# Patient Record
Sex: Female | Born: 1945
Health system: Southern US, Community
[De-identification: ages and names within clinical notes are randomized; demographics above are authoritative.]

## PROBLEM LIST (undated history)

## (undated) DIAGNOSIS — I38 Endocarditis, valve unspecified: Secondary | ICD-10-CM

## (undated) DIAGNOSIS — E785 Hyperlipidemia, unspecified: Secondary | ICD-10-CM

## (undated) DIAGNOSIS — D649 Anemia, unspecified: Secondary | ICD-10-CM

## (undated) DIAGNOSIS — Z973 Presence of spectacles and contact lenses: Secondary | ICD-10-CM

## (undated) DIAGNOSIS — I10 Essential (primary) hypertension: Secondary | ICD-10-CM

## (undated) DIAGNOSIS — E039 Hypothyroidism, unspecified: Secondary | ICD-10-CM

## (undated) DIAGNOSIS — I5189 Other ill-defined heart diseases: Secondary | ICD-10-CM

## (undated) DIAGNOSIS — T7840XA Allergy, unspecified, initial encounter: Secondary | ICD-10-CM

## (undated) DIAGNOSIS — E119 Type 2 diabetes mellitus without complications: Secondary | ICD-10-CM

## (undated) HISTORY — PX: TONSILLECTOMY: SHX5217

## (undated) HISTORY — PX: TUBAL LIGATION: SHX77

## (undated) HISTORY — DX: Other ill-defined heart diseases: I51.89

## (undated) HISTORY — DX: Endocarditis, valve unspecified: I38

## (undated) HISTORY — DX: Hypothyroidism, unspecified: E03.9

## (undated) HISTORY — DX: Hyperlipidemia, unspecified: E78.5

## (undated) HISTORY — PX: COLONOSCOPY: SHX174

## (undated) HISTORY — DX: Allergy, unspecified, initial encounter: T78.40XA

## (undated) HISTORY — PX: GANGLION CYST EXCISION: SHX1691

## (undated) HISTORY — DX: Essential (primary) hypertension: I10

## (undated) HISTORY — DX: Type 2 diabetes mellitus without complications: E11.9

---

## 2005-05-11 ENCOUNTER — Ambulatory Visit: Payer: Self-pay

## 2005-05-23 ENCOUNTER — Ambulatory Visit: Payer: Self-pay

## 2005-11-24 ENCOUNTER — Ambulatory Visit: Payer: Self-pay

## 2006-03-14 ENCOUNTER — Ambulatory Visit: Payer: Self-pay | Admitting: Gastroenterology

## 2006-04-19 DIAGNOSIS — D531 Other megaloblastic anemias, not elsewhere classified: Secondary | ICD-10-CM | POA: Insufficient documentation

## 2006-05-01 ENCOUNTER — Ambulatory Visit: Payer: Self-pay | Admitting: Family Medicine

## 2006-05-31 ENCOUNTER — Ambulatory Visit: Payer: Self-pay

## 2007-03-07 ENCOUNTER — Encounter: Payer: Self-pay | Admitting: Cardiovascular Disease

## 2007-07-02 ENCOUNTER — Ambulatory Visit: Payer: Self-pay | Admitting: Family Medicine

## 2008-09-08 ENCOUNTER — Ambulatory Visit: Payer: Self-pay | Admitting: Family Medicine

## 2009-05-17 ENCOUNTER — Encounter: Payer: Self-pay | Admitting: Cardiovascular Disease

## 2009-11-12 ENCOUNTER — Ambulatory Visit: Payer: Self-pay | Admitting: Family Medicine

## 2010-05-02 ENCOUNTER — Encounter: Payer: Self-pay | Admitting: Cardiovascular Disease

## 2010-05-31 ENCOUNTER — Encounter: Payer: Self-pay | Admitting: Cardiovascular Disease

## 2010-06-16 ENCOUNTER — Ambulatory Visit: Payer: Self-pay | Admitting: Cardiovascular Disease

## 2010-06-16 DIAGNOSIS — R011 Cardiac murmur, unspecified: Secondary | ICD-10-CM

## 2010-10-25 NOTE — Letter (Signed)
Summary: Medical Record Release  Medical Record Release   Imported By: Harlon Flor 06/16/2010 13:40:16  _____________________________________________________________________  External Attachment:    Type:   Image     Comment:   External Document

## 2010-10-25 NOTE — Letter (Signed)
Summary: [facility]  [facility]   Imported By: Marylou Mccoy 06/30/2010 14:00:21  _____________________________________________________________________  External Attachment:    Type:   Image     Comment:   External Document

## 2010-10-25 NOTE — Miscellaneous (Signed)
Summary: refill lisinopril hctz  Clinical Lists Changes  Medications: Added new medication of LISINOPRIL-HYDROCHLOROTHIAZIDE 20-12.5 MG TABS (LISINOPRIL-HYDROCHLOROTHIAZIDE) one tablet once daily - Signed Rx of LISINOPRIL-HYDROCHLOROTHIAZIDE 20-12.5 MG TABS (LISINOPRIL-HYDROCHLOROTHIAZIDE) one tablet once daily;  #30 x 0;  Signed;  Entered by: Bishop Dublin, CMA;  Authorized by: Dossie Arbour MD;  Method used: [id number] [address] [place], [place], [place]  [address], Ph: [phone], Fax: [phone]    Prescriptions: LISINOPRIL-HYDROCHLOROTHIAZIDE 20-12.5 MG TABS (LISINOPRIL-HYDROCHLOROTHIAZIDE) one tablet once daily  #30 x 0   Entered by:   Bishop Dublin, CMA   Authorized by:   Dossie Arbour MD   Signed by:   Bishop Dublin, CMA on 05/31/2010   Method used:   [facility]ronically to        [facility] [phone]* (retail)       [address]       [place], [place]  [address]       Ph: [phone]       Fax: [phone]   RxID:   [id number]

## 2010-10-25 NOTE — Assessment & Plan Note (Signed)
Summary: NP6/AMD   Visit Type:  Initial Consult Primary Michaela Weber:  [name].  CC:  Denies chest pain or shrotness of breath..  History of Present Illness: Michaela Weber is a very pleasant 65 year old woman with no known coronary artery disease, history of hypertension, diastolic dysfunction, hypothyroidism, cardiac murmur with mild aortic valve sclerosis, hyperlipidemia who presents to establish care. She was last seen by myself at [facility] in August 2010.  She reports that she has been doing well. She has recently started aerobics as her cholesterol is high. She tried to watch what she eats. She has no shortness of breath or chest discomfort. Overall has no new complaints.she ran out of her blood pressure medication last week. Notes indicate this was started for diastolic pressures greater than 90.  EKG shows normal sinus rhythm with rate of 89 beats per minute, no significant ST or T wave changes  total cholesterol is 214, LDL 138, HDL 57  Preventive Screening-Counseling & Management  Alcohol-Tobacco     Smoking Status: never  Caffeine-Diet-Exercise     Does Patient Exercise: yes  Current Medications (verified): 1)  Lisinopril-Hydrochlorothiazide 20-12.5 Mg Tabs (Lisinopril-Hydrochlorothiazide) .... One Tablet Once Daily 2)  Levothroid 100 Mcg Tabs (Levothyroxine Sodium) .... One Tablet Once Daily 3)  Aspir-Low 81 Mg Tbec (Aspirin) .... One Tablet Once Daily 4)  Vitamin D 2 1.25 Mg .... Once Weekly 5)  Cyanocobalamin 1000 Mcg/ml Soln (Cyanocobalamin) .... Once Monthly  Allergies (verified): 1)  ! Codeine  Past History:  Family History: Last updated: 07-Jul-2010 Father: deceased diabetes Mother: deceased Alzheimer's  Social History: Last updated: Jul 07, 2010 Retired  Married  Tobacco Use - No.  Alcohol Use - no Regular Exercise - yes--2-3 x weekly aerobics.  Risk Factors: Exercise: yes (July 07, 2010)  Risk Factors: Smoking Status:  never (July 07, 2010)  Past Medical History: Hypertension Hypothyroidism Vitamin D deficiency mild valvular disease diastolic dysfunction  Past Surgical History: tonsillectomy C-section x 2 tubligation  Family History: Father: deceased diabetes Mother: deceased Alzheimer's  Social History: Retired  Married  Tobacco Use - No.  Alcohol Use - no Regular Exercise - yes--2-3 x weekly aerobics. Smoking Status:  never Does Patient Exercise:  yes  Review of Systems  The patient denies fever, weight loss, weight gain, vision loss, decreased hearing, hoarseness, chest pain, syncope, dyspnea on exertion, peripheral edema, prolonged cough, abdominal pain, incontinence, muscle weakness, depression, and enlarged lymph nodes.    Vital Signs:  Patient profile:   65 year old female Height:      63 inches Weight:      152 pounds BMI:     27.02 Pulse rate:   89 / minute BP sitting:   132 / 82  (left arm) Cuff size:   regular  Vitals Entered By: Bishop Dublin, CMA (07-07-2010 9:57 AM)  Physical Exam  General:  Well developed, well nourished, in no acute distress. Head:  normocephalic and atraumatic Neck:  Neck supple, no JVD. No masses, thyromegaly or abnormal cervical nodes. Lungs:  Clear bilaterally to auscultation and percussion. Heart:  Non-displaced PMI, chest non-tender; regular rate and rhythm, S1, S2 with II/VI SEM RSB, no rubs or gallops. Carotid upstroke normal, no bruit.Pedals normal pulses. No edema, no varicosities. Abdomen:   abdomen soft and non-tender without masses Msk:  Back normal, normal gait. Muscle strength and tone normal. Pulses:  pulses normal in all 4 extremities Extremities:  No clubbing or cyanosis. Neurologic:  Alert and oriented x 3. Skin:  Intact  without lesions or rashes. Psych:  Normal affect.   Impression & Recommendations:  Problem # 1:  HYPERTENSION, BENIGN (ICD-401.1)  she ran out of her blood pressure medication last week. Have  asked her to monitor her blood pressure at home. If she has systolics greater than 140, diastolic greater than 90, have asked her to either restart half or the whole dose of her lisinopril HCT. I have given her some parameters of where her blood pressure should be.  Her updated medication list for this problem includes:    Lisinopril-hydrochlorothiazide 20-12.5 Mg Tabs (Lisinopril-hydrochlorothiazide) ..... One tablet once daily    Aspir-low 81 Mg Tbec (Aspirin) ..... One tablet once daily  Problem # 2:  MURMUR (ICD-785.2)  Murmur is secondary to aortic valve sclerosis with no significant stenosis. We will watch this every few years and would repeat an echocardiogram if her murmur gets louder.  Her updated medication list for this problem includes:    Lisinopril-hydrochlorothiazide 20-12.5 Mg Tabs (Lisinopril-hydrochlorothiazide) ..... One tablet once daily  Problem # 3:  HYPERLIPIDEMIA-MIXED (ICD-272.4) we talked to her about her lipids. They are mildly elevated and have encouraged her to continue to watch her diet, lose weight, increase her aerobics  Other Orders: EKG w/ Interpretation (93000)  Patient Instructions: 1)  Your physician recommends that you continue on your current medications as directed. Please refer to the Current Medication list given to you today. 2)  Your physician wants you to follow-up in:  1 year  You will receive a reminder letter in the mail two months in advance. If you don't receive a letter, please call our office to schedule the follow-up appointment. 3)  Your physician has requested that you regularly monitor and record your blood pressure readings at home.  Prescriptions: LISINOPRIL-HYDROCHLOROTHIAZIDE 20-12.5 MG TABS (LISINOPRIL-HYDROCHLOROTHIAZIDE) one tablet once daily  #30 x 12   Entered by:   Benedict Needy, RN   Authorized by:   Dossie Arbour MD   Signed by:   Benedict Needy, RN on 06/16/2010   Method used:   [facility]ronically to        [facility]  [phone]* (retail)       [address]       [place], [place]  [address]       Ph: [phone]       Fax: [phone]   RxID:   [id number]

## 2010-10-25 NOTE — Letter (Signed)
Summary: Patient Receipt of NPP  Patient Receipt of NPP   Imported By: Marylou Mccoy 06/28/2010 14:00:50  _____________________________________________________________________  External Attachment:    Type:   Image     Comment:   External Document

## 2010-11-17 ENCOUNTER — Ambulatory Visit: Payer: Self-pay | Admitting: Family Medicine

## 2011-04-11 ENCOUNTER — Encounter: Payer: Self-pay | Admitting: Cardiovascular Disease

## 2011-06-26 ENCOUNTER — Encounter: Payer: Self-pay | Admitting: Cardiovascular Disease

## 2011-06-28 ENCOUNTER — Ambulatory Visit (INDEPENDENT_AMBULATORY_CARE_PROVIDER_SITE_OTHER): Payer: Medicare Other | Admitting: Cardiovascular Disease

## 2011-06-28 ENCOUNTER — Encounter: Payer: Self-pay | Admitting: Cardiovascular Disease

## 2011-06-28 DIAGNOSIS — E785 Hyperlipidemia, unspecified: Secondary | ICD-10-CM

## 2011-06-28 DIAGNOSIS — I10 Essential (primary) hypertension: Secondary | ICD-10-CM

## 2011-06-28 DIAGNOSIS — R011 Cardiac murmur, unspecified: Secondary | ICD-10-CM

## 2011-06-28 NOTE — Assessment & Plan Note (Signed)
Soft murmur is from aortic sclerosis, no significant stenosis on previous evaluations.

## 2011-06-28 NOTE — Patient Instructions (Signed)
You are doing well. You could try Red Yeast Rice for cholesterol Please call us if you have new issues that need to be addressed before your next appt.  We will call you for a follow up Appt. In 12 months

## 2011-06-28 NOTE — Assessment & Plan Note (Signed)
We did spend some time talking about her cholesterol, the importance of eating well, exercising on a regular basis. Her cholesterol has improved. We did suggest that if she would like to be more aggressive with her cholesterol, that she could take over-the-counter red yeast rice.

## 2011-06-28 NOTE — Progress Notes (Signed)
   Patient ID: Michaela Weber, female    DOB: [birth year], 65 y.o.   MRN: [id number]  HPI Comments: Michaela Weber is a very pleasant 65 year old woman with no known coronary artery disease, history of hypertension, diastolic dysfunction, hypothyroidism, cardiac murmur with mild aortic valve sclerosis, hyperlipidemia who presents for routine followup.  She reports that she has had significant headaches to August lasting for 2 weeks. No over-the-counter medication seems to have helped her headache. Blood pressure was noted to be elevated with systolic pressures in the 140 range over 90. She was seen by Dr. Elease Hashimoto who restarted her blood pressure medication, lisinopril HCT 10/12.5 mg daily. She reports that this seems to have helped her headache and currently she feels well.    EKG shows normal sinus rhythm with rate of 83 beats per minute, no significant ST or T wave changes   total cholesterol is 189, HDL 52, LDL 104      Outpatient Encounter Prescriptions as of 06/28/2011  Medication Sig Dispense Refill  . aspirin 81 MG EC tablet Take 81 mg by mouth daily.        . cyanocobalamin (,VITAMIN B-12,) 1000 MCG/ML injection Inject 1,000 mcg into the muscle every 30 (thirty) days.        Marland Kitchen levothyroxine (SYNTHROID, LEVOTHROID) 100 MCG tablet Take 100 mcg by mouth daily.        Marland Kitchen lisinopril-hydrochlorothiazide (PRINZIDE,ZESTORETIC) 10-12.5 MG per tablet Take 1 tablet by mouth daily.        Marland Kitchen VITAMIN D, CHOLECALCIFEROL, PO Take by mouth once a week.           Review of Systems  Constitutional: Negative.   HENT: Negative.   Eyes: Negative.   Respiratory: Negative.   Cardiovascular: Negative.   Gastrointestinal: Negative.   Musculoskeletal: Negative.   Skin: Negative.   Neurological: Negative.   Hematological: Negative.   Psychiatric/Behavioral: Negative.   All other systems reviewed and are negative.    BP 130/91  Pulse 89  Ht 5\' 2"  (1.575 m)  Wt 152 lb (68.947 kg)  BMI 27.80  kg/m2   Physical Exam  Nursing note and vitals reviewed. Constitutional: She is oriented to person, place, and time. She appears well-developed and well-nourished.  HENT:  Head: Normocephalic.  Nose: Nose normal.  Mouth/Throat: Oropharynx is clear and moist.  Eyes: Conjunctivae are normal. Pupils are equal, round, and reactive to light.  Neck: Normal range of motion. Neck supple. No JVD present.  Cardiovascular: Normal rate, regular rhythm, S1 normal, S2 normal, normal heart sounds and intact distal pulses.  Exam reveals no gallop and no friction rub.   No murmur heard. Pulmonary/Chest: Effort normal and breath sounds normal. No respiratory distress. She has no wheezes. She has no rales. She exhibits no tenderness.  Abdominal: Soft. Bowel sounds are normal. She exhibits no distension. There is no tenderness.  Musculoskeletal: Normal range of motion. She exhibits no edema and no tenderness.  Lymphadenopathy:    She has no cervical adenopathy.  Neurological: She is alert and oriented to person, place, and time. Coordination normal.  Skin: Skin is warm and dry. No rash noted. No erythema.  Psychiatric: She has a normal mood and affect. Her behavior is normal. Judgment and thought content normal.         Assessment and Plan

## 2011-06-28 NOTE — Assessment & Plan Note (Signed)
Blood pressure was well-controlled on today's visit. No suggestions were made for medication changes.

## 2011-11-21 ENCOUNTER — Ambulatory Visit: Payer: Self-pay | Admitting: Family Medicine

## 2013-01-13 ENCOUNTER — Ambulatory Visit: Payer: Self-pay | Admitting: Family Medicine

## 2013-01-27 ENCOUNTER — Ambulatory Visit: Payer: Self-pay | Admitting: Family Medicine

## 2014-02-17 ENCOUNTER — Ambulatory Visit: Payer: Self-pay | Admitting: Family Medicine

## 2014-09-29 DIAGNOSIS — E538 Deficiency of other specified B group vitamins: Secondary | ICD-10-CM | POA: Diagnosis not present

## 2014-10-29 DIAGNOSIS — E538 Deficiency of other specified B group vitamins: Secondary | ICD-10-CM | POA: Diagnosis not present

## 2014-11-24 DIAGNOSIS — E039 Hypothyroidism, unspecified: Secondary | ICD-10-CM | POA: Diagnosis not present

## 2014-11-24 DIAGNOSIS — R739 Hyperglycemia, unspecified: Secondary | ICD-10-CM | POA: Diagnosis not present

## 2014-11-24 DIAGNOSIS — E0590 Thyrotoxicosis, unspecified without thyrotoxic crisis or storm: Secondary | ICD-10-CM | POA: Diagnosis not present

## 2014-11-24 DIAGNOSIS — D531 Other megaloblastic anemias, not elsewhere classified: Secondary | ICD-10-CM | POA: Diagnosis not present

## 2014-11-25 DIAGNOSIS — D531 Other megaloblastic anemias, not elsewhere classified: Secondary | ICD-10-CM | POA: Diagnosis not present

## 2014-11-25 DIAGNOSIS — I10 Essential (primary) hypertension: Secondary | ICD-10-CM | POA: Diagnosis not present

## 2014-11-25 DIAGNOSIS — E538 Deficiency of other specified B group vitamins: Secondary | ICD-10-CM | POA: Diagnosis not present

## 2014-11-25 DIAGNOSIS — R739 Hyperglycemia, unspecified: Secondary | ICD-10-CM | POA: Diagnosis not present

## 2014-11-25 DIAGNOSIS — E785 Hyperlipidemia, unspecified: Secondary | ICD-10-CM | POA: Diagnosis not present

## 2014-12-24 DIAGNOSIS — E538 Deficiency of other specified B group vitamins: Secondary | ICD-10-CM | POA: Diagnosis not present

## 2015-01-21 DIAGNOSIS — D531 Other megaloblastic anemias, not elsewhere classified: Secondary | ICD-10-CM | POA: Diagnosis not present

## 2015-02-02 ENCOUNTER — Other Ambulatory Visit: Payer: Self-pay

## 2015-02-02 DIAGNOSIS — Z1231 Encounter for screening mammogram for malignant neoplasm of breast: Secondary | ICD-10-CM

## 2015-02-23 ENCOUNTER — Ambulatory Visit
Admission: RE | Admit: 2015-02-23 | Discharge: 2015-02-23 | Disposition: A | Discharge status: Home / Self Care | Payer: Medicare Other | Source: Ambulatory Visit | Attending: Family Medicine | Admitting: Family Medicine

## 2015-02-23 ENCOUNTER — Other Ambulatory Visit: Payer: Self-pay

## 2015-02-23 DIAGNOSIS — Z1231 Encounter for screening mammogram for malignant neoplasm of breast: Secondary | ICD-10-CM

## 2015-03-03 ENCOUNTER — Other Ambulatory Visit (INDEPENDENT_AMBULATORY_CARE_PROVIDER_SITE_OTHER): Payer: Medicare Other | Admitting: Family Medicine

## 2015-03-03 ENCOUNTER — Encounter: Payer: Self-pay | Admitting: Family Medicine

## 2015-03-03 VITALS — BP 108/70 | HR 92 | Temp 97.7°F | Resp 16 | Ht 63.0 in | Wt 147.0 lb

## 2015-03-03 DIAGNOSIS — E538 Deficiency of other specified B group vitamins: Secondary | ICD-10-CM | POA: Diagnosis not present

## 2015-03-03 MED ORDER — CYANOCOBALAMIN 1000 MCG/ML IJ SOLN: 1000.0000 ug | INTRAMUSCULAR | Status: DC

## 2015-03-03 MED ORDER — CYANOCOBALAMIN 1000 MCG/ML IJ SOLN
1000.0000 ug | Freq: Once | INTRAMUSCULAR | Status: AC
  Administered 2015-03-03: 1000 ug via INTRAMUSCULAR

## 2015-03-05 ENCOUNTER — Telehealth: Payer: Self-pay | Admitting: Family Medicine

## 2015-03-05 NOTE — Telephone Encounter (Signed)
Advised pt mammogram is negative; advised pt to disregard B12 prescription due to error. Renaldo Fiddler, CMA

## 2015-03-05 NOTE — Telephone Encounter (Signed)
Pt is requesting Mammogram results.  [facility] 12 injections.  Pt states she has not been getting these for several years.  Pt states she has been receiving these in the office.  [facility]    CB#[phone]/MJ

## 2015-03-31 ENCOUNTER — Ambulatory Visit (INDEPENDENT_AMBULATORY_CARE_PROVIDER_SITE_OTHER): Payer: Medicare Other

## 2015-03-31 DIAGNOSIS — E538 Deficiency of other specified B group vitamins: Secondary | ICD-10-CM

## 2015-03-31 MED ORDER — CYANOCOBALAMIN 1000 MCG/ML IJ SOLN
1000.0000 ug | Freq: Once | INTRAMUSCULAR | Status: AC
  Administered 2015-03-31: 1000 ug via INTRAMUSCULAR

## 2015-03-31 NOTE — Progress Notes (Signed)
Vitamin B12 injection given at office today. Tolerated well. Pt will come back in 4 weeks per MD's order.

## 2015-04-28 ENCOUNTER — Ambulatory Visit (INDEPENDENT_AMBULATORY_CARE_PROVIDER_SITE_OTHER): Payer: Medicare Other

## 2015-04-28 DIAGNOSIS — E538 Deficiency of other specified B group vitamins: Secondary | ICD-10-CM

## 2015-04-28 MED ORDER — CYANOCOBALAMIN 1000 MCG/ML IJ SOLN
1000.0000 ug | Freq: Once | INTRAMUSCULAR | Status: AC
  Administered 2015-04-28: 1000 ug via INTRAMUSCULAR

## 2015-04-28 NOTE — Progress Notes (Signed)
Vitamin B12  Injection today at office. Tolerated well. Will be back in 4 weeks per MD's order.

## 2015-05-10 ENCOUNTER — Other Ambulatory Visit: Payer: Self-pay | Admitting: Family Medicine

## 2015-05-10 DIAGNOSIS — I10 Essential (primary) hypertension: Secondary | ICD-10-CM

## 2015-05-10 NOTE — Telephone Encounter (Signed)
This is a pt of Dr. Sharyon Medicus, can you refill this prescription?  Future appointment is on 05/26/2015.  Thanks,

## 2015-05-26 ENCOUNTER — Ambulatory Visit (INDEPENDENT_AMBULATORY_CARE_PROVIDER_SITE_OTHER): Payer: Medicare Other

## 2015-05-26 DIAGNOSIS — E538 Deficiency of other specified B group vitamins: Secondary | ICD-10-CM | POA: Diagnosis not present

## 2015-05-26 MED ORDER — CYANOCOBALAMIN 1000 MCG/ML IJ SOLN
1000.0000 ug | Freq: Once | INTRAMUSCULAR | Status: AC
  Administered 2015-05-26: 1000 ug via INTRAMUSCULAR

## 2015-05-26 NOTE — Progress Notes (Signed)
Vitamin B 12 Injection today at office. Tolerated well. Will come back in 4 weeks per MD's order.

## 2015-06-16 ENCOUNTER — Other Ambulatory Visit: Payer: Self-pay | Admitting: Family Medicine

## 2015-06-23 ENCOUNTER — Ambulatory Visit (INDEPENDENT_AMBULATORY_CARE_PROVIDER_SITE_OTHER): Payer: Medicare Other

## 2015-06-23 DIAGNOSIS — E538 Deficiency of other specified B group vitamins: Secondary | ICD-10-CM

## 2015-06-23 MED ORDER — CYANOCOBALAMIN 1000 MCG/ML IJ SOLN
1000.0000 ug | Freq: Once | INTRAMUSCULAR | Status: AC
  Administered 2015-06-23: 1000 ug via INTRAMUSCULAR

## 2015-06-23 MED ORDER — CYANOCOBALAMIN 1000 MCG/ML IJ SOLN: 1000.0000 ug | INTRAMUSCULAR | Status: DC

## 2015-07-14 ENCOUNTER — Other Ambulatory Visit: Payer: Self-pay | Admitting: Family Medicine

## 2015-07-14 DIAGNOSIS — E039 Hypothyroidism, unspecified: Secondary | ICD-10-CM

## 2015-07-14 DIAGNOSIS — I10 Essential (primary) hypertension: Secondary | ICD-10-CM | POA: Insufficient documentation

## 2015-07-14 DIAGNOSIS — E785 Hyperlipidemia, unspecified: Secondary | ICD-10-CM | POA: Insufficient documentation

## 2015-07-14 DIAGNOSIS — E559 Vitamin D deficiency, unspecified: Secondary | ICD-10-CM | POA: Insufficient documentation

## 2015-07-14 DIAGNOSIS — E1159 Type 2 diabetes mellitus with other circulatory complications: Secondary | ICD-10-CM | POA: Insufficient documentation

## 2015-07-14 DIAGNOSIS — E1169 Type 2 diabetes mellitus with other specified complication: Secondary | ICD-10-CM | POA: Insufficient documentation

## 2015-07-14 NOTE — Telephone Encounter (Signed)
Last OV 11/2014   

## 2015-07-16 ENCOUNTER — Other Ambulatory Visit: Payer: Self-pay | Admitting: Family Medicine

## 2015-07-16 DIAGNOSIS — I10 Essential (primary) hypertension: Secondary | ICD-10-CM

## 2015-07-16 NOTE — Telephone Encounter (Signed)
Last OV 11/25/2014  Thanks,   -Mickel Baas

## 2015-07-21 ENCOUNTER — Ambulatory Visit (INDEPENDENT_AMBULATORY_CARE_PROVIDER_SITE_OTHER): Payer: Medicare Other

## 2015-07-21 DIAGNOSIS — E538 Deficiency of other specified B group vitamins: Secondary | ICD-10-CM

## 2015-07-21 MED ORDER — CYANOCOBALAMIN 1000 MCG/ML IJ SOLN
1000.0000 ug | Freq: Once | INTRAMUSCULAR | Status: AC
  Administered 2015-07-27: 1000 ug via INTRAMUSCULAR

## 2015-07-27 DIAGNOSIS — E538 Deficiency of other specified B group vitamins: Secondary | ICD-10-CM | POA: Diagnosis not present

## 2015-08-20 ENCOUNTER — Telehealth: Payer: Self-pay

## 2015-08-20 MED ORDER — BENZONATATE 100 MG PO CAPS: 200.0000 mg | ORAL_CAPSULE | Freq: Three times a day (TID) | ORAL | Status: DC | PRN

## 2015-08-20 NOTE — Telephone Encounter (Signed)
Can put in Monday. Cough does linger for a while. Can send in Tessalon perls if she would like to try those. Thanks.

## 2015-08-20 NOTE — Telephone Encounter (Signed)
Pt advised, appt made, she would like to try the medication. Please send this in-aa

## 2015-08-20 NOTE — Telephone Encounter (Signed)
Patient called and states she has had a cough for almost 2 weeks and it was productive but now is more dry, has hard time sleeping due to cough, hoarse. No fever, no chills, and she does not feel bad at all she just has this lingering. She wanted to make appt for Monday but you have same spots available only is that ok? Is there something she can try to help her cough until then? She has tried Delsym.-aa

## 2015-08-23 ENCOUNTER — Encounter: Payer: Self-pay | Admitting: Family Medicine

## 2015-08-23 ENCOUNTER — Ambulatory Visit (INDEPENDENT_AMBULATORY_CARE_PROVIDER_SITE_OTHER): Payer: Medicare Other | Admitting: Family Medicine

## 2015-08-23 VITALS — BP 120/74 | HR 88 | Temp 98.7°F | Resp 16 | Wt 148.0 lb

## 2015-08-23 DIAGNOSIS — D531 Other megaloblastic anemias, not elsewhere classified: Secondary | ICD-10-CM

## 2015-08-23 DIAGNOSIS — J069 Acute upper respiratory infection, unspecified: Secondary | ICD-10-CM

## 2015-08-23 MED ORDER — CYANOCOBALAMIN 1000 MCG/ML IJ SOLN
1000.0000 ug | Freq: Once | INTRAMUSCULAR | Status: AC
  Administered 2015-08-23: 1000 ug via INTRAMUSCULAR

## 2015-08-23 NOTE — Progress Notes (Signed)
Patient ID: Michaela Weber, female   DOB: 18-Aug-1946, 69 y.o.   MRN: [id number]        Patient: Michaela Weber Female    DOB: 1946/06/05   69 y.o.   MRN: [id number] Visit Date: 08/23/2015  Today's Provider: [name], MD   Chief Complaint  Patient presents with  . URI   Subjective:    URI  This is a new problem. The current episode started 1 to 4 weeks ago. The problem has been gradually worsening. There has been no fever. Associated symptoms include congestion, coughing, headaches, sinus pain and a sore throat (Off and on). Pertinent negatives include no abdominal pain, chest pain, diarrhea, dysuria, ear pain, joint pain, joint swelling, nausea, neck pain, plugged ear sensation, rash, rhinorrhea, sneezing, swollen glands, vomiting or wheezing. Treatments tried: Tessolon perls help at night.  The treatment provided mild relief.    Just not getting better as quickly as she would like. Just no energy to do anything.      Allergies  Allergen Reactions  . Codeine    Previous Medications   ASPIRIN 81 MG EC TABLET    Take 81 mg by mouth daily.     BENZONATATE (TESSALON) 100 MG CAPSULE    Take 2 capsules (200 mg total) by mouth 3 (three) times daily as needed for cough.   CYANOCOBALAMIN (,VITAMIN B-12,) 1000 MCG/ML INJECTION    Inject 1 mL (1,000 mcg total) into the muscle every 30 (thirty) days.   LEVOTHYROXINE (SYNTHROID, LEVOTHROID) 100 MCG TABLET    Take 100 mcg by mouth daily.     LEVOTHYROXINE (SYNTHROID, LEVOTHROID) 112 MCG TABLET    TAKE 1 TABLET BY MOUTH EVERY DAY   LISINOPRIL-HYDROCHLOROTHIAZIDE (PRINZIDE,ZESTORETIC) 10-12.5 MG TABLET    TAKE 1 TABLET BY MOUTH EVERY DAY   VITAMIN D, CHOLECALCIFEROL, PO    Take by mouth once a week.      Review of Systems  Constitutional: Positive for chills and fatigue. Negative for fever, diaphoresis, activity change, appetite change and unexpected weight change.  HENT: Positive for congestion, sinus pressure, sore throat (Off and on) and  voice change. Negative for ear discharge, ear pain, facial swelling, hearing loss, nosebleeds, postnasal drip, rhinorrhea, sneezing, tinnitus and trouble swallowing.   Respiratory: Positive for cough. Negative for apnea, choking, chest tightness, shortness of breath, wheezing and stridor.   Cardiovascular: Negative.  Negative for chest pain, palpitations and leg swelling.  Gastrointestinal: Negative.  Negative for nausea, vomiting, abdominal pain and diarrhea.  Genitourinary: Negative for dysuria.  Musculoskeletal: Negative for joint pain and neck pain.  Skin: Negative for rash.  Neurological: Positive for headaches. Negative for dizziness and light-headedness.    Social History  Substance Use Topics  . Smoking status: Never Smoker   . Smokeless tobacco: Never Used     Comment: tobacco use - no  . Alcohol Use: No   Objective:   BP 120/74 mmHg  Pulse 88  Temp(Src) 98.7 F (37.1 C) (Oral)  Resp 16  Wt 148 lb (67.132 kg)  SpO2 97%  Physical Exam  Constitutional: She is oriented to person, place, and time. She appears well-developed and well-nourished.  HENT:  Head: Normocephalic and atraumatic.  Right Ear: External ear normal.  Left Ear: External ear normal.  Mouth/Throat: Oropharynx is clear and moist.  Eyes: Conjunctivae and EOM are normal. Pupils are equal, round, and reactive to light.  Neck: Normal range of motion. Neck supple.  Cardiovascular: Normal rate and regular rhythm.  Pulmonary/Chest: Effort normal and breath sounds normal.  Neurological: She is alert and oriented to person, place, and time.  Psychiatric: She has a normal mood and affect. Her behavior is normal. Judgment and thought content normal.      Assessment & Plan:     1. Upper respiratory infection Gradually improving. Will hold off on antibiotic for now. Patient instructed to call back if condition worsens or does not improve.     2. Megaloblastic anemia due to B12 deficiency B12 given today.  -  cyanocobalamin ((VITAMIN B-12)) injection 1,000 mcg; Inject 1 mL (1,000 mcg total) into the muscle once.     [name], MD  [facility]

## 2015-08-25 ENCOUNTER — Ambulatory Visit: Payer: Medicare Other

## 2015-09-22 ENCOUNTER — Ambulatory Visit (INDEPENDENT_AMBULATORY_CARE_PROVIDER_SITE_OTHER): Payer: Medicare Other

## 2015-09-22 DIAGNOSIS — E538 Deficiency of other specified B group vitamins: Secondary | ICD-10-CM | POA: Diagnosis not present

## 2015-09-22 MED ORDER — CYANOCOBALAMIN 1000 MCG/ML IJ SOLN
1000.0000 ug | Freq: Once | INTRAMUSCULAR | Status: AC
  Administered 2015-09-22: 1000 ug via INTRAMUSCULAR

## 2015-10-20 ENCOUNTER — Ambulatory Visit (INDEPENDENT_AMBULATORY_CARE_PROVIDER_SITE_OTHER): Payer: Medicare Other

## 2015-10-20 DIAGNOSIS — E538 Deficiency of other specified B group vitamins: Secondary | ICD-10-CM | POA: Diagnosis not present

## 2015-10-20 MED ORDER — CYANOCOBALAMIN 1000 MCG/ML IJ SOLN
1000.0000 ug | Freq: Once | INTRAMUSCULAR | Status: AC
  Administered 2015-10-20: 1000 ug via INTRAMUSCULAR

## 2015-11-17 ENCOUNTER — Ambulatory Visit (INDEPENDENT_AMBULATORY_CARE_PROVIDER_SITE_OTHER): Payer: Medicare Other

## 2015-11-17 DIAGNOSIS — E538 Deficiency of other specified B group vitamins: Secondary | ICD-10-CM | POA: Diagnosis not present

## 2015-11-17 MED ORDER — CYANOCOBALAMIN 1000 MCG/ML IJ SOLN
1000.0000 ug | Freq: Once | INTRAMUSCULAR | Status: AC
  Administered 2015-11-17: 1000 ug via INTRAMUSCULAR

## 2015-12-15 ENCOUNTER — Ambulatory Visit (INDEPENDENT_AMBULATORY_CARE_PROVIDER_SITE_OTHER): Payer: Medicare Other

## 2015-12-15 ENCOUNTER — Telehealth: Payer: Self-pay

## 2015-12-15 DIAGNOSIS — E538 Deficiency of other specified B group vitamins: Secondary | ICD-10-CM | POA: Diagnosis not present

## 2015-12-15 MED ORDER — CYANOCOBALAMIN 1000 MCG/ML IJ SOLN
1000.0000 ug | Freq: Once | INTRAMUSCULAR | Status: AC
  Administered 2015-12-15: 1000 ug via INTRAMUSCULAR

## 2015-12-17 ENCOUNTER — Other Ambulatory Visit: Payer: Self-pay | Admitting: Family Medicine

## 2015-12-17 DIAGNOSIS — E039 Hypothyroidism, unspecified: Secondary | ICD-10-CM

## 2015-12-17 DIAGNOSIS — I10 Essential (primary) hypertension: Secondary | ICD-10-CM

## 2015-12-17 NOTE — Telephone Encounter (Signed)
Entered in Error

## 2016-01-12 ENCOUNTER — Ambulatory Visit (INDEPENDENT_AMBULATORY_CARE_PROVIDER_SITE_OTHER): Payer: Medicare Other

## 2016-01-12 DIAGNOSIS — E538 Deficiency of other specified B group vitamins: Secondary | ICD-10-CM | POA: Diagnosis not present

## 2016-01-12 MED ORDER — CYANOCOBALAMIN 1000 MCG/ML IJ SOLN
1000.0000 ug | Freq: Once | INTRAMUSCULAR | Status: AC
  Administered 2016-01-12: 1000 ug via INTRAMUSCULAR

## 2016-01-13 ENCOUNTER — Other Ambulatory Visit: Payer: Self-pay | Admitting: Family Medicine

## 2016-01-13 DIAGNOSIS — Z1231 Encounter for screening mammogram for malignant neoplasm of breast: Secondary | ICD-10-CM

## 2016-02-16 ENCOUNTER — Ambulatory Visit (INDEPENDENT_AMBULATORY_CARE_PROVIDER_SITE_OTHER): Payer: Medicare Other | Admitting: Family Medicine

## 2016-02-16 ENCOUNTER — Encounter: Payer: Self-pay | Admitting: Family Medicine

## 2016-02-16 VITALS — BP 110/72 | HR 88 | Temp 98.1°F | Resp 16 | Ht 62.0 in | Wt 150.0 lb

## 2016-02-16 DIAGNOSIS — Z780 Asymptomatic menopausal state: Secondary | ICD-10-CM

## 2016-02-16 DIAGNOSIS — E538 Deficiency of other specified B group vitamins: Secondary | ICD-10-CM | POA: Diagnosis not present

## 2016-02-16 DIAGNOSIS — E039 Hypothyroidism, unspecified: Secondary | ICD-10-CM

## 2016-02-16 DIAGNOSIS — R519 Headache, unspecified: Secondary | ICD-10-CM | POA: Insufficient documentation

## 2016-02-16 DIAGNOSIS — E119 Type 2 diabetes mellitus without complications: Secondary | ICD-10-CM | POA: Insufficient documentation

## 2016-02-16 DIAGNOSIS — E785 Hyperlipidemia, unspecified: Secondary | ICD-10-CM | POA: Diagnosis not present

## 2016-02-16 DIAGNOSIS — Z0000 Encounter for general adult medical examination without abnormal findings: Secondary | ICD-10-CM

## 2016-02-16 DIAGNOSIS — R51 Headache: Secondary | ICD-10-CM

## 2016-02-16 DIAGNOSIS — R7303 Prediabetes: Secondary | ICD-10-CM | POA: Insufficient documentation

## 2016-02-16 DIAGNOSIS — Z1211 Encounter for screening for malignant neoplasm of colon: Secondary | ICD-10-CM | POA: Diagnosis not present

## 2016-02-16 DIAGNOSIS — R739 Hyperglycemia, unspecified: Secondary | ICD-10-CM | POA: Diagnosis not present

## 2016-02-16 DIAGNOSIS — I10 Essential (primary) hypertension: Secondary | ICD-10-CM | POA: Diagnosis not present

## 2016-02-16 DIAGNOSIS — L259 Unspecified contact dermatitis, unspecified cause: Secondary | ICD-10-CM | POA: Insufficient documentation

## 2016-02-16 DIAGNOSIS — Z1239 Encounter for other screening for malignant neoplasm of breast: Secondary | ICD-10-CM

## 2016-02-16 DIAGNOSIS — L301 Dyshidrosis [pompholyx]: Secondary | ICD-10-CM | POA: Insufficient documentation

## 2016-02-16 DIAGNOSIS — K219 Gastro-esophageal reflux disease without esophagitis: Secondary | ICD-10-CM | POA: Insufficient documentation

## 2016-02-16 DIAGNOSIS — R5383 Other fatigue: Secondary | ICD-10-CM | POA: Insufficient documentation

## 2016-02-16 DIAGNOSIS — J309 Allergic rhinitis, unspecified: Secondary | ICD-10-CM | POA: Insufficient documentation

## 2016-02-16 MED ORDER — CYANOCOBALAMIN 1000 MCG/ML IJ SOLN
1000.0000 ug | Freq: Once | INTRAMUSCULAR | Status: AC
  Administered 2016-02-16: 1000 ug via INTRAMUSCULAR

## 2016-02-16 NOTE — Progress Notes (Signed)
Patient ID: Michaela Weber, female   DOB: 1945-11-08, 70 y.o.   MRN: [id number]       Patient: Michaela Weber, Female    DOB: May 17, 1946, 70 y.o.   MRN: [id number] Visit Date: 02/16/2016  Today's Provider: [name], MD   Chief Complaint  Patient presents with  . Medicare Wellness   Subjective:    Annual wellness visit Michaela Weber is a 70 y.o. female. She feels well. She reports exercising 2 times week. She reports she is sleeping well.  11/24/11 AWE 10/03/10 Pap-neg 02/23/15 Mammogram-BI-RADS 1 12/13/11 BMD-osteopenia 03/14/06 Colonoscopy-internal hemorrohids, Dr. Allen Norris -----------------------------------------------------------   Review of Systems  Constitutional: Negative.   HENT: Negative.   Eyes: Negative.   Respiratory: Negative.   Cardiovascular: Negative.   Gastrointestinal: Negative.   Endocrine: Negative.   Genitourinary: Negative.   Musculoskeletal: Negative.   Skin: Negative.   Allergic/Immunologic: Negative.   Neurological: Negative.   Hematological: Negative.   Psychiatric/Behavioral: Negative.     Social History   Social History  . Marital Status: Married    Spouse Name: N/A  . Number of Children: N/A  . Years of Education: N/A   Occupational History  . Not on file.   Social History Main Topics  . Smoking status: Never Smoker   . Smokeless tobacco: Never Used     Comment: tobacco use - no  . Alcohol Use: No  . Drug Use: No  . Sexual Activity: Not on file   Other Topics Concern  . Not on file   Social History Narrative   Married; retired; regular exercise 2-3 x weekly aerobics     Past Medical History  Diagnosis Date  . HTN (hypertension)   . Hypothyroidism   . Vitamin D deficiency   . Valvular disease     mild  . Diastolic dysfunction      Patient Active Problem List   Diagnosis Date Noted  . Allergic rhinitis 02/16/2016  . CD (contact dermatitis) 02/16/2016  . Cheiropodopompholyx 02/16/2016  . Fatigue 02/16/2016  .  Acid reflux 02/16/2016  . Cephalalgia 02/16/2016  . Blood glucose elevated 02/16/2016  . Avitaminosis D 07/14/2015  . BP (high blood pressure) 07/14/2015  . HLD (hyperlipidemia) 07/14/2015  . HYPERLIPIDEMIA-MIXED 06/16/2010  . HYPERTENSION, BENIGN 06/16/2010  . MURMUR 06/16/2010  . Disorder of mitral valve 05/15/2007  . Disturbance of skin sensation 04/26/2006  . Megaloblastic anemia due to B12 deficiency 04/19/2006  . Anemia, iron deficiency 02/12/2006  . Adult hypothyroidism 02/06/2006    Past Surgical History  Procedure Laterality Date  . Tonsillectomy    . Cesarean section      x2   . Tubligation      with last C-Section  . Tubal ligation      Her family history includes Diabetes in her father; Mental retardation in her mother.    Previous Medications   ASPIRIN 81 MG EC TABLET    Take 81 mg by mouth daily.     CYANOCOBALAMIN (,VITAMIN B-12,) 1000 MCG/ML INJECTION    Inject 1 mL (1,000 mcg total) into the muscle every 30 (thirty) days.   LEVOTHYROXINE (SYNTHROID, LEVOTHROID) 112 MCG TABLET    TAKE 1 TABLET BY MOUTH EVERY DAY   LISINOPRIL-HYDROCHLOROTHIAZIDE (PRINZIDE,ZESTORETIC) 10-12.5 MG TABLET    TAKE 1 TABLET BY MOUTH EVERY DAY   VITAMIN D, CHOLECALCIFEROL, PO    Take 1 capsule by mouth daily.     Patient Care Team: [name], MD as PCP - General (  Family Medicine)     Objective:   Vitals: BP 110/72 mmHg  Pulse 88  Temp(Src) 98.1 F (36.7 C) (Oral)  Resp 16  Ht 5\' 2"  (1.575 m)  Wt 150 lb (68.04 kg)  BMI 27.43 kg/m2  Physical Exam  Constitutional: She is oriented to person, place, and time. She appears well-developed and well-nourished.  HENT:  Head: Normocephalic and atraumatic.  Right Ear: Tympanic membrane, external ear and ear canal normal.  Left Ear: Tympanic membrane, external ear and ear canal normal.  Nose: Nose normal.  Mouth/Throat: Uvula is midline, oropharynx is clear and moist and mucous membranes are normal.  Eyes: Conjunctivae, EOM  and lids are normal. Pupils are equal, round, and reactive to light.  Neck: Trachea normal and normal range of motion. Neck supple. Carotid bruit is not present. No thyroid mass and no thyromegaly present.  Cardiovascular: Normal rate and regular rhythm.   Murmur heard. Pulmonary/Chest: Effort normal and breath sounds normal.  Abdominal: Soft. Normal appearance and bowel sounds are normal. There is no hepatosplenomegaly. There is no tenderness.  Genitourinary: No breast swelling, tenderness or discharge.  Musculoskeletal: Normal range of motion.  Lymphadenopathy:    She has no cervical adenopathy.    She has no axillary adenopathy.  Neurological: She is alert and oriented to person, place, and time. She has normal strength. No cranial nerve deficit.  Skin: Skin is warm, dry and intact.  Psychiatric: She has a normal mood and affect. Her speech is normal and behavior is normal. Judgment and thought content normal. Cognition and memory are normal.    Activities of Daily Living In your present state of health, do you have any difficulty performing the following activities: 02/16/2016  Hearing? N  Vision? N  Difficulty concentrating or making decisions? Y  Walking or climbing stairs? N  Dressing or bathing? N  Doing errands, shopping? N    Fall Risk Assessment Fall Risk  02/16/2016  Falls in the past year? No     Depression Screen PHQ 2/9 Scores 02/16/2016  PHQ - 2 Score 0    Cognitive Testing - 6-CIT  Correct? Score   What year is it? yes 0 0 or 4  What month is it? yes 0 0 or 3  Memorize:    Michaela Weber,  42,  [address]  [place],      What time is it? (within 1 hour) yes 0 0 or 3  Count backwards from 20 yes 0 0, 2, or 4  Name the months of the year yes 0 0, 2, or 4  Repeat name & address above yes 0 0, 2, 4, 6, 8, or 10       TOTAL SCORE  0/28   Interpretation:  Normal  Normal (0-7) Abnormal (8-28)       Assessment & Plan:     Annual Wellness Visit  Reviewed  patient's Family Medical History Reviewed and updated list of patient's medical providers Assessment of cognitive impairment was done Assessed patient's functional ability Established a written schedule for health screening [place] Completed and Reviewed  Exercise Activities and Dietary recommendations Goals    None         1. Medicare annual wellness visit, subsequent Stable. Patient advised to continue eating healthy and exercise daily.  2. B12 deficiency Stable. Patient to continue monthly injections. - PR VITAMIN B12 INJECTION - cyanocobalamin ((VITAMIN B-12)) injection 1,000 mcg; Inject 1 mL (1,000 mcg total) into the muscle once.  3. HYPERTENSION, BENIGN - CBC with Differential/Platelet - Comprehensive metabolic panel  4. Hypothyroidism, unspecified hypothyroidism type - TSH  5. HLD (hyperlipidemia) - Lipid Panel With LDL/HDL Ratio  6. Blood glucose elevated - Hemoglobin A1c  7. Colon cancer screening - Ambulatory referral to Gastroenterology  8. Postmenopausal - DG Bone Density; Future     Patient seen and examined by Dr. Jerrell Belfast, and note scribed by Philbert Riser. Dimas, CMA.  I have reviewed the document for accuracy and completeness and I agree with above. Jerrell Belfast, MD   [name], MD   ------------------------------------------------------------------------------------------------------------

## 2016-02-18 DIAGNOSIS — E039 Hypothyroidism, unspecified: Secondary | ICD-10-CM | POA: Diagnosis not present

## 2016-02-18 DIAGNOSIS — I10 Essential (primary) hypertension: Secondary | ICD-10-CM | POA: Diagnosis not present

## 2016-02-18 DIAGNOSIS — R739 Hyperglycemia, unspecified: Secondary | ICD-10-CM | POA: Diagnosis not present

## 2016-02-18 DIAGNOSIS — E785 Hyperlipidemia, unspecified: Secondary | ICD-10-CM | POA: Diagnosis not present

## 2016-02-19 LAB — CBC WITH DIFFERENTIAL/PLATELET
BASOS ABS: 0.1 10*3/uL (ref 0.0–0.2)
Basos: 1 %
EOS (ABSOLUTE): 0.3 10*3/uL (ref 0.0–0.4)
Eos: 4 %
Hematocrit: 39.7 % (ref 34.0–46.6)
Hemoglobin: 12.8 g/dL (ref 11.1–15.9)
IMMATURE GRANS (ABS): 0 10*3/uL (ref 0.0–0.1)
IMMATURE GRANULOCYTES: 0 %
LYMPHS: 24 %
Lymphocytes Absolute: 1.9 10*3/uL (ref 0.7–3.1)
MCH: 25.7 pg — AB (ref 26.6–33.0)
MCHC: 32.2 g/dL (ref 31.5–35.7)
MCV: 80 fL (ref 79–97)
Monocytes Absolute: 0.5 10*3/uL (ref 0.1–0.9)
Monocytes: 6 %
Neutrophils Absolute: 5.1 10*3/uL (ref 1.4–7.0)
Neutrophils: 65 %
PLATELETS: 284 10*3/uL (ref 150–379)
RBC: 4.98 x10E6/uL (ref 3.77–5.28)
RDW: 14.7 % (ref 12.3–15.4)
WBC: 7.9 10*3/uL (ref 3.4–10.8)

## 2016-02-19 LAB — HEMOGLOBIN A1C
ESTIMATED AVERAGE GLUCOSE: 134 mg/dL
Hgb A1c MFr Bld: 6.3 % — ABNORMAL HIGH (ref 4.8–5.6)

## 2016-02-19 LAB — LIPID PANEL WITH LDL/HDL RATIO
CHOLESTEROL TOTAL: 194 mg/dL (ref 100–199)
HDL: 54 mg/dL (ref >39)
LDL Calculated: 117 mg/dL — ABNORMAL HIGH (ref 0–99)
LDl/HDL Ratio: 2.2 ratio units (ref 0.0–3.2)
Triglycerides: 115 mg/dL (ref 0–149)
VLDL Cholesterol Cal: 23 mg/dL (ref 5–40)

## 2016-02-19 LAB — COMPREHENSIVE METABOLIC PANEL
ALT: 13 IU/L (ref 0–32)
AST: 17 IU/L (ref 0–40)
Albumin/Globulin Ratio: 1.7 (ref 1.2–2.2)
Albumin: 4.3 g/dL (ref 3.6–4.8)
Alkaline Phosphatase: 102 IU/L (ref 39–117)
BILIRUBIN TOTAL: 0.4 mg/dL (ref 0.0–1.2)
BUN/Creatinine Ratio: 24 (ref 12–28)
BUN: 15 mg/dL (ref 8–27)
CALCIUM: 9.7 mg/dL (ref 8.7–10.3)
CHLORIDE: 97 mmol/L (ref 96–106)
CO2: 27 mmol/L (ref 18–29)
Creatinine, Ser: 0.63 mg/dL (ref 0.57–1.00)
GFR calc non Af Amer: 92 mL/min/{1.73_m2} (ref >59)
GFR, EST AFRICAN AMERICAN: 106 mL/min/{1.73_m2} (ref >59)
GLUCOSE: 109 mg/dL — AB (ref 65–99)
Globulin, Total: 2.6 g/dL (ref 1.5–4.5)
Potassium: 4.4 mmol/L (ref 3.5–5.2)
Sodium: 143 mmol/L (ref 134–144)
Total Protein: 6.9 g/dL (ref 6.0–8.5)

## 2016-02-19 LAB — TSH: TSH: 5.86 u[IU]/mL — ABNORMAL HIGH (ref 0.450–4.500)

## 2016-02-22 ENCOUNTER — Other Ambulatory Visit: Payer: Self-pay

## 2016-02-22 DIAGNOSIS — E039 Hypothyroidism, unspecified: Secondary | ICD-10-CM

## 2016-02-22 MED ORDER — LEVOTHYROXINE SODIUM 125 MCG PO TABS: 125.0000 ug | ORAL_TABLET | Freq: Every day | ORAL | Status: DC

## 2016-02-22 NOTE — Telephone Encounter (Signed)
-----   Message from [name], MD sent at 02/19/2016  2:36 PM EDT ----- Labs stable except thyroid a little low and blood sugar is pre-diabetic. Please see if patient would like to increase her thyroid medication. If so, increase to 125 and have her recheck in 6 weeks. Thanks.

## 2016-02-22 NOTE — Telephone Encounter (Signed)
LMTCB 02/22/2016  Thanks,   -Mickel Baas

## 2016-02-22 NOTE — Telephone Encounter (Signed)
Pt advised she agreed to increase her thyroid medication.  Please send to [facility] in [place].  She is going to call in six weeks to get a lab sheet.   Thanks,   -Mickel Baas

## 2016-02-24 ENCOUNTER — Other Ambulatory Visit: Payer: Self-pay | Admitting: Family Medicine

## 2016-02-24 ENCOUNTER — Ambulatory Visit
Admission: RE | Admit: 2016-02-24 | Discharge: 2016-02-24 | Disposition: A | Discharge status: Home / Self Care | Payer: Medicare Other | Source: Ambulatory Visit | Attending: Family Medicine | Admitting: Family Medicine

## 2016-02-24 DIAGNOSIS — Z1231 Encounter for screening mammogram for malignant neoplasm of breast: Secondary | ICD-10-CM | POA: Insufficient documentation

## 2016-02-24 LAB — HM MAMMOGRAPHY

## 2016-03-06 ENCOUNTER — Other Ambulatory Visit: Payer: Self-pay

## 2016-03-06 ENCOUNTER — Telehealth: Payer: Self-pay

## 2016-03-06 ENCOUNTER — Ambulatory Visit: Payer: Medicare Other

## 2016-03-06 DIAGNOSIS — Z1211 Encounter for screening for malignant neoplasm of colon: Secondary | ICD-10-CM

## 2016-03-06 MED ORDER — NA SULFATE-K SULFATE-MG SULF 17.5-3.13-1.6 GM/177ML PO SOLN: 1.0000 | ORAL | Status: DC

## 2016-03-06 NOTE — Telephone Encounter (Signed)
Patient scheduled for 04/07/16 at [facility].  Paperwork mailed and Suprep sent to preferred pharmacy.  Encouraged to call with questions after receiving paperwork.

## 2016-03-06 NOTE — Telephone Encounter (Signed)
Gastroenterology Pre-Procedure Review  Request Date: 04/07/16 Requesting Physician: Dr. Allen Norris  PATIENT REVIEW QUESTIONS: The patient responded to the following health history questions as indicated:    1. Are you having any GI issues? no 2. Do you have a personal history of Polyps? Unknown- Had Last Colonoscopy with [facility] 3. Do you have a family history of Colon Cancer or Polyps? no 4. Diabetes Mellitus? no 5. Joint replacements in the past 12 months?no 6. Major health problems in the past 3 months?no 7. Any artificial heart valves, MVP, or defibrillator?no    MEDICATIONS & ALLERGIES:    Patient reports the following regarding taking any anticoagulation/antiplatelet therapy:   Plavix, Coumadin, Eliquis, Xarelto, Lovenox, Pradaxa, Brilinta, or Effient? no Aspirin? yes (81mg  Daily)  Patient confirms/reports the following medications:  Current Outpatient Prescriptions  Medication Sig Dispense Refill  . aspirin 81 MG EC tablet Take 81 mg by mouth daily.      Marland Kitchen levothyroxine (SYNTHROID, LEVOTHROID) 125 MCG tablet Take 1 tablet (125 mcg total) by mouth daily. 90 tablet 1  . lisinopril-hydrochlorothiazide (PRINZIDE,ZESTORETIC) 10-12.5 MG tablet TAKE 1 TABLET BY MOUTH EVERY DAY 90 tablet 1  . VITAMIN D, CHOLECALCIFEROL, PO Take 1 capsule by mouth daily.      No current facility-administered medications for this visit.    Patient confirms/reports the following allergies:  Allergies  Allergen Reactions  . Codeine     No orders of the defined types were placed in this encounter.    AUTHORIZATION INFORMATION Primary Insurance: 1D#: Group #:  Secondary Insurance: 1D#: Group #:  SCHEDULE INFORMATION: Date: 04/07/16 Time: Location: [place]

## 2016-03-08 DIAGNOSIS — M859 Disorder of bone density and structure, unspecified: Secondary | ICD-10-CM | POA: Diagnosis not present

## 2016-03-08 DIAGNOSIS — M8580 Other specified disorders of bone density and structure, unspecified site: Secondary | ICD-10-CM | POA: Diagnosis not present

## 2016-03-08 DIAGNOSIS — E2839 Other primary ovarian failure: Secondary | ICD-10-CM | POA: Diagnosis not present

## 2016-03-08 DIAGNOSIS — Z780 Asymptomatic menopausal state: Secondary | ICD-10-CM | POA: Diagnosis not present

## 2016-03-17 ENCOUNTER — Other Ambulatory Visit: Payer: Self-pay

## 2016-03-17 ENCOUNTER — Encounter: Payer: Self-pay | Admitting: Family Medicine

## 2016-03-17 ENCOUNTER — Ambulatory Visit (INDEPENDENT_AMBULATORY_CARE_PROVIDER_SITE_OTHER): Payer: Medicare Other

## 2016-03-17 DIAGNOSIS — E538 Deficiency of other specified B group vitamins: Secondary | ICD-10-CM

## 2016-03-17 MED ORDER — CYANOCOBALAMIN 1000 MCG/ML IJ SOLN
1000.0000 ug | Freq: Once | INTRAMUSCULAR | Status: AC
  Administered 2016-03-17: 1000 ug via INTRAMUSCULAR

## 2016-03-17 MED ORDER — PEG 3350-KCL-NA BICARB-NACL 420 G PO SOLR: 4000.0000 mL | ORAL | Status: DC

## 2016-04-03 ENCOUNTER — Encounter: Payer: Self-pay | Admitting: *Deleted

## 2016-04-06 NOTE — Discharge Instructions (Signed)

## 2016-04-07 ENCOUNTER — Ambulatory Visit
Admission: RE | Admit: 2016-04-07 | Discharge: 2016-04-07 | Disposition: A | Discharge status: Home / Self Care | Payer: Medicare Other | Source: Ambulatory Visit | Attending: Gastroenterology | Admitting: Gastroenterology

## 2016-04-07 ENCOUNTER — Encounter
Admission: RE | Disposition: A | Discharge status: Home / Self Care | Payer: Self-pay | Source: Ambulatory Visit | Attending: Gastroenterology

## 2016-04-07 ENCOUNTER — Ambulatory Visit: Payer: Medicare Other | Admitting: Anesthesiology

## 2016-04-07 DIAGNOSIS — I10 Essential (primary) hypertension: Secondary | ICD-10-CM | POA: Diagnosis not present

## 2016-04-07 DIAGNOSIS — I519 Heart disease, unspecified: Secondary | ICD-10-CM | POA: Insufficient documentation

## 2016-04-07 DIAGNOSIS — E039 Hypothyroidism, unspecified: Secondary | ICD-10-CM | POA: Insufficient documentation

## 2016-04-07 DIAGNOSIS — K641 Second degree hemorrhoids: Secondary | ICD-10-CM | POA: Insufficient documentation

## 2016-04-07 DIAGNOSIS — Z86010 Personal history of colonic polyps: Secondary | ICD-10-CM | POA: Insufficient documentation

## 2016-04-07 DIAGNOSIS — K219 Gastro-esophageal reflux disease without esophagitis: Secondary | ICD-10-CM | POA: Diagnosis not present

## 2016-04-07 DIAGNOSIS — Z833 Family history of diabetes mellitus: Secondary | ICD-10-CM | POA: Diagnosis not present

## 2016-04-07 DIAGNOSIS — D120 Benign neoplasm of cecum: Secondary | ICD-10-CM | POA: Diagnosis not present

## 2016-04-07 DIAGNOSIS — Z7982 Long term (current) use of aspirin: Secondary | ICD-10-CM | POA: Diagnosis not present

## 2016-04-07 DIAGNOSIS — R51 Headache: Secondary | ICD-10-CM | POA: Diagnosis not present

## 2016-04-07 DIAGNOSIS — Z810 Family history of intellectual disabilities: Secondary | ICD-10-CM | POA: Insufficient documentation

## 2016-04-07 DIAGNOSIS — Z1211 Encounter for screening for malignant neoplasm of colon: Secondary | ICD-10-CM | POA: Insufficient documentation

## 2016-04-07 DIAGNOSIS — E559 Vitamin D deficiency, unspecified: Secondary | ICD-10-CM | POA: Diagnosis not present

## 2016-04-07 DIAGNOSIS — Z860101 Personal history of adenomatous and serrated colon polyps: Secondary | ICD-10-CM | POA: Insufficient documentation

## 2016-04-07 HISTORY — PX: POLYPECTOMY: SHX5525

## 2016-04-07 HISTORY — DX: Presence of spectacles and contact lenses: Z97.3

## 2016-04-07 HISTORY — PX: COLONOSCOPY WITH PROPOFOL: SHX5780

## 2016-04-07 HISTORY — DX: Anemia, unspecified: D64.9

## 2016-04-07 SURGERY — COLONOSCOPY WITH PROPOFOL
Anesthesia: Monitor Anesthesia Care | Wound class: Contaminated

## 2016-04-07 MED ORDER — LIDOCAINE HCL (CARDIAC) 20 MG/ML IV SOLN
INTRAVENOUS | Status: DC | PRN
  Administered 2016-04-07: 50 mg via INTRAVENOUS

## 2016-04-07 MED ORDER — LACTATED RINGERS IV SOLN
INTRAVENOUS | Status: DC
  Administered 2016-04-07: 09:00:00 via INTRAVENOUS

## 2016-04-07 MED ORDER — STERILE WATER FOR IRRIGATION IR SOLN
Status: DC | PRN
  Administered 2016-04-07: 10:00:00

## 2016-04-07 MED ORDER — SODIUM CHLORIDE 0.9 % IV SOLN: INTRAVENOUS | Status: DC

## 2016-04-07 MED ORDER — LACTATED RINGERS IV SOLN: 500.0000 mL | INTRAVENOUS | Status: DC

## 2016-04-07 MED ORDER — PROPOFOL 10 MG/ML IV BOLUS
INTRAVENOUS | Status: DC | PRN
  Administered 2016-04-07 (×5): 20 mg via INTRAVENOUS
  Administered 2016-04-07: 10 mg via INTRAVENOUS
  Administered 2016-04-07: 20 mg via INTRAVENOUS
  Administered 2016-04-07: 10 mg via INTRAVENOUS
  Administered 2016-04-07: 20 mg via INTRAVENOUS
  Administered 2016-04-07: 60 mg via INTRAVENOUS
  Administered 2016-04-07: 20 mg via INTRAVENOUS

## 2016-04-07 SURGICAL SUPPLY — 23 items
CANISTER SUCT 1200ML W/VALVE (MISCELLANEOUS) ×4 IMPLANT
CLIP HMST 235XBRD CATH ROT (MISCELLANEOUS) IMPLANT
CLIP RESOLUTION 360 11X235 (MISCELLANEOUS)
FCP ESCP3.2XJMB 240X2.8X (MISCELLANEOUS)
FORCEPS BIOP RAD 4 LRG CAP 4 (CUTTING FORCEPS) ×4 IMPLANT
FORCEPS BIOP RJ4 240 W/NDL (MISCELLANEOUS)
FORCEPS ESCP3.2XJMB 240X2.8X (MISCELLANEOUS) IMPLANT
GOWN CVR UNV OPN BCK APRN NK (MISCELLANEOUS) ×4 IMPLANT
GOWN ISOL THUMB LOOP REG UNIV (MISCELLANEOUS) ×4
INJECTOR VARIJECT VIN23 (MISCELLANEOUS) IMPLANT
KIT DEFENDO VALVE AND CONN (KITS) IMPLANT
KIT ENDO PROCEDURE OLY (KITS) ×4 IMPLANT
MARKER SPOT ENDO TATTOO 5ML (MISCELLANEOUS) IMPLANT
PAD GROUND ADULT SPLIT (MISCELLANEOUS) IMPLANT
PROBE APC STR FIRE (PROBE) IMPLANT
RETRIEVER NET ROTH 2.5X230 LF (MISCELLANEOUS) ×4 IMPLANT
SNARE SHORT THROW 13M SML OVAL (MISCELLANEOUS) IMPLANT
SNARE SHORT THROW 30M LRG OVAL (MISCELLANEOUS) IMPLANT
SNARE SNG USE RND 15MM (INSTRUMENTS) IMPLANT
SPOT EX ENDOSCOPIC TATTOO (MISCELLANEOUS)
TRAP ETRAP POLY (MISCELLANEOUS) IMPLANT
VARIJECT INJECTOR VIN23 (MISCELLANEOUS)
WATER STERILE IRR 250ML POUR (IV SOLUTION) ×4 IMPLANT

## 2016-04-07 NOTE — Anesthesia Procedure Notes (Signed)
Procedure Name: MAC Performed by: Charly Hunton Pre-anesthesia Checklist: Patient identified, Emergency Drugs available, Suction available, Timeout performed and Patient being monitored Patient Re-evaluated:Patient Re-evaluated prior to inductionOxygen Delivery Method: Nasal cannula Placement Confirmation: positive ETCO2       

## 2016-04-07 NOTE — Anesthesia Postprocedure Evaluation (Signed)
Anesthesia Post Note  Patient: Michaela Weber  Procedure(s) Performed: Procedure(s) (LRB): COLONOSCOPY WITH PROPOFOL (N/A) POLYPECTOMY  Patient location during evaluation: PACU Anesthesia Type: MAC Level of consciousness: awake and alert Pain management: pain level controlled Vital Signs Assessment: post-procedure vital signs reviewed and stable Respiratory status: spontaneous breathing, nonlabored ventilation and respiratory function stable Cardiovascular status: blood pressure returned to baseline and stable Postop Assessment: no signs of nausea or vomiting Anesthetic complications: no    Schyler Counsell D Lachlan Mckim

## 2016-04-07 NOTE — H&P (Signed)
Lucilla Lame, MD [facility] [address], [place], [place] [address] Phone: [phone] Fax : [phone]  Primary Care Physician:  Michaela Rana, MD Primary Gastroenterologist:  Dr. Allen Norris  Pre-Procedure History & Physical: HPI:  Michaela Weber is a 70 y.o. female is here for a screening colonoscopy.   Past Medical History  Diagnosis Date  . HTN (hypertension)   . Hypothyroidism   . Vitamin D deficiency   . Valvular disease     mild  . Diastolic dysfunction   . Anemia     in past  . Wears contact lenses     Past Surgical History  Procedure Laterality Date  . Tonsillectomy    . Cesarean section      x2   . Tubal ligation      with 2nd c-sect  . Colonoscopy  approx 2007    Prior to Admission medications   Medication Sig Start Date End Date Taking? Authorizing Provider  aspirin 81 MG EC tablet Take 81 mg by mouth daily.     Yes Historical Provider, MD  Cyanocobalamin (B-12 COMPLIANCE INJECTION) 1000 MCG/ML KIT Inject 1 mL as directed every 30 (thirty) days.   Yes Historical Provider, MD  levothyroxine (SYNTHROID, LEVOTHROID) 125 MCG tablet Take 1 tablet (125 mcg total) by mouth daily. 02/22/16  Yes Michaela Rana, MD  lisinopril-hydrochlorothiazide (PRINZIDE,ZESTORETIC) 10-12.5 MG tablet TAKE 1 TABLET BY MOUTH EVERY DAY 12/17/15  Yes Michaela Rana, MD  VITAMIN D, CHOLECALCIFEROL, PO Take 1 capsule by mouth daily.    Yes Historical Provider, MD  Na Sulfate-K Sulfate-Mg Sulf (SUPREP BOWEL PREP KIT) 17.5-3.13-1.6 GM/180ML SOLN Take 1 kit by mouth as directed. 03/06/16   Lucilla Lame, MD  polyethylene glycol-electrolytes (TRILYTE) 420 g solution Take 4,000 mLs by mouth as directed. Drink one 8 oz glass every 20 mins until stools are clear. 03/17/16   Lucilla Lame, MD    Allergies as of 03/06/2016 - Review Complete 02/16/2016  Allergen Reaction Noted  . Codeine  06/16/2010    Family History  Problem Relation Age of Onset  . Diabetes Father   . Mental retardation Mother      Social History   Social History  . Marital Status: Married    Spouse Name: N/A  . Number of Children: N/A  . Years of Education: N/A   Occupational History  . Not on file.   Social History Main Topics  . Smoking status: Never Smoker   . Smokeless tobacco: Never Used     Comment: tobacco use - no  . Alcohol Use: No  . Drug Use: No  . Sexual Activity: Not on file   Other Topics Concern  . Not on file   Social History Narrative   Married; retired; regular exercise 2-3 x weekly aerobics     Review of Systems: See HPI, otherwise negative ROS  Physical Exam: Ht '5\' 2"'$  (1.575 m)  Wt 146 lb (66.225 kg)  BMI 26.70 kg/m2 General:   Alert,  pleasant and cooperative in NAD Head:  Normocephalic and atraumatic. Neck:  Supple; no masses or thyromegaly. Lungs:  Clear throughout to auscultation.    Heart:  Regular rate and rhythm. Abdomen:  Soft, nontender and nondistended. Normal bowel sounds, without guarding, and without rebound.   Neurologic:  Alert and  oriented x4;  grossly normal neurologically.  Impression/Plan: Michaela Weber is now here to undergo a screening colonoscopy.  Risks, benefits, and alternatives regarding colonoscopy have been reviewed with the patient.  Questions  have been answered.  All parties agreeable.

## 2016-04-07 NOTE — Op Note (Signed)
[facility] Gastroenter[facility] Patient Name: Michaela Weber Procedure Date: 04/07/2016 10:04 AM MRN: [id number] Account #: [id number] Date of Birth: [birth year] Admit Type: Outpatient Age: 70 Room: [facility] OR ROOM 01 Gender: Female Note Status: Finalized Procedure:            Colonoscopy Indications:          Screening for colorectal malignant neoplasm Providers:            Lucilla Lame MD, MD Referring MD:         Jerrell Belfast, MD (Referring MD) Medicines:            Propofol per Anesthesia Complications:        No immediate complications. Procedure:            Pre-Anesthesia Assessment:                       - Prior to the procedure, a History and Physical was                        performed, and patient medications and allergies were                        reviewed. The patient's tolerance of previous                        anesthesia was also reviewed. The risks and benefits of                        the procedure and the sedation options and risks were                        discussed with the patient. All questions were                        answered, and informed consent was obtained. Prior                        Anticoagulants: The patient has taken no previous                        anticoagulant or antiplatelet agents. ASA Grade                        Assessment: II - A patient with mild systemic disease.                        After reviewing the risks and benefits, the patient was                        deemed in satisfactory condition to undergo the                        procedure.                       After obtaining informed consent, the colonoscope was                        passed under direct vision. Throughout the procedure,  the patient's blood pressure, pulse, and oxygen                        saturations were monitored continuously. The Olympus                        CF-HQ190L Colonoscope (S#. [id number]) was introduced                     through the anus and advanced to the the cecum,                        identified by appendiceal orifice and ileocecal valve.                        The colonoscopy was performed without difficulty. The                        patient tolerated the procedure well. The quality of                        the bowel preparation was excellent. Findings:      The perianal and digital rectal examinations were normal.      A 3 mm polyp was found in the cecum. The polyp was sessile. The polyp       was removed with a cold biopsy forceps. Resection and retrieval were       complete.      Non-bleeding internal hemorrhoids were found during retroflexion. The       hemorrhoids were Grade II (internal hemorrhoids that prolapse but reduce       spontaneously). Impression:           - One 3 mm polyp in the cecum, removed with a cold                        biopsy forceps. Resected and retrieved.                       - Non-bleeding internal hemorrhoids. Recommendation:       - Await pathology results.                       - Repeat colonoscopy in 5 years if polyp adenoma and 10                        years if hyperplastic Procedure Code(s):    --- Professional ---                       [id number], Colonoscopy, flexible; with biopsy, single or                        multiple Diagnosis Code(s):    --- Professional ---                       Z12.11, Encounter for screening for malignant neoplasm                        of colon                       D12.0,  Benign neoplasm of cecum CPT copyright 2016 American Medical Association. All rights reserved. The codes documented in this report are preliminary and upon coder review may  be revised to meet current compliance requirements. Lucilla Lame MD, MD 04/07/2016 10:24:43 AM This report has been signed electronically. Number of Addenda: 0 Note Initiated On: 04/07/2016 10:04 AM Scope Withdrawal Time: 0 hours 6 minutes 54 seconds  Total Procedure  Duration: 0 hours 13 minutes 8 seconds       [facility]

## 2016-04-07 NOTE — Anesthesia Preprocedure Evaluation (Signed)
Anesthesia Evaluation  Patient identified by MRN, date of birth, ID band Patient awake    Reviewed: Allergy & Precautions, H&P , NPO status , Patient's Chart, lab work & pertinent test results, reviewed documented beta blocker date and time   Airway Mallampati: III  TM Distance: >3 FB Neck ROM: full    Dental no notable dental hx.    Pulmonary neg pulmonary ROS,    Pulmonary exam normal breath sounds clear to auscultation       Cardiovascular Exercise Tolerance: Good hypertension, On Medications  Rhythm:regular Rate:Normal     Neuro/Psych  Headaches, negative psych ROS   GI/Hepatic Neg liver ROS, GERD  Medicated,  Endo/Other  Hypothyroidism   Renal/GU negative Renal ROS  negative genitourinary   Musculoskeletal   Abdominal   Peds  Hematology  (+) anemia ,   Anesthesia Other Findings   Reproductive/Obstetrics negative OB ROS                             Anesthesia Physical Anesthesia Plan  ASA: II  Anesthesia Plan: MAC   Post-op Pain Management:    Induction:   Airway Management Planned:   Additional Equipment:   Intra-op Plan:   Post-operative Plan:   Informed Consent: I have reviewed the patients History and Physical, chart, labs and discussed the procedure including the risks, benefits and alternatives for the proposed anesthesia with the patient or authorized representative who has indicated his/her understanding and acceptance.     Plan Discussed with: CRNA  Anesthesia Plan Comments:         Anesthesia Quick Evaluation

## 2016-04-07 NOTE — Transfer of Care (Signed)
Immediate Anesthesia Transfer of Care Note  Patient: Michaela Weber  Procedure(s) Performed: Procedure(s): COLONOSCOPY WITH PROPOFOL (N/A) POLYPECTOMY  Patient Location: PACU  Anesthesia Type: MAC  Level of Consciousness: awake, alert  and patient cooperative  Airway and Oxygen Therapy: Patient Spontanous Breathing and Patient connected to supplemental oxygen  Post-op Assessment: Post-op Vital signs reviewed, Patient's Cardiovascular Status Stable, Respiratory Function Stable, Patent Airway and No signs of Nausea or vomiting  Post-op Vital Signs: Reviewed and stable  Complications: No apparent anesthesia complications

## 2016-04-10 ENCOUNTER — Encounter: Payer: Self-pay | Admitting: Gastroenterology

## 2016-04-11 ENCOUNTER — Encounter: Payer: Self-pay | Admitting: Gastroenterology

## 2016-04-12 ENCOUNTER — Encounter: Payer: Self-pay | Admitting: Gastroenterology

## 2016-04-12 ENCOUNTER — Ambulatory Visit (INDEPENDENT_AMBULATORY_CARE_PROVIDER_SITE_OTHER): Payer: Medicare Other

## 2016-04-12 DIAGNOSIS — E538 Deficiency of other specified B group vitamins: Secondary | ICD-10-CM | POA: Diagnosis not present

## 2016-04-12 MED ORDER — CYANOCOBALAMIN 1000 MCG/ML IJ SOLN
1000.0000 ug | Freq: Once | INTRAMUSCULAR | Status: AC
  Administered 2016-04-12: 1000 ug via INTRAMUSCULAR

## 2016-04-17 ENCOUNTER — Telehealth: Payer: Self-pay

## 2016-04-17 DIAGNOSIS — E039 Hypothyroidism, unspecified: Secondary | ICD-10-CM

## 2016-04-17 NOTE — Telephone Encounter (Signed)
Ok. Please print order for tsh for hypothyroid. Thanks.

## 2016-04-17 NOTE — Telephone Encounter (Signed)
Patient called requesting lab slip to re check her TSH and would like to get this done tomorrow morning please-aa

## 2016-04-18 NOTE — Telephone Encounter (Signed)
Lab slip printed for patient. Tried calling patient and no answer. Will try again later.

## 2016-04-19 NOTE — Telephone Encounter (Signed)
Left detailed message on patient voiced message system that labs slip was ready for pick up.

## 2016-04-20 LAB — TSH: TSH: 0.473 u[IU]/mL (ref 0.450–4.500)

## 2016-04-28 NOTE — Telephone Encounter (Signed)
error 

## 2016-05-10 ENCOUNTER — Ambulatory Visit (INDEPENDENT_AMBULATORY_CARE_PROVIDER_SITE_OTHER): Payer: Medicare Other

## 2016-05-10 DIAGNOSIS — E538 Deficiency of other specified B group vitamins: Secondary | ICD-10-CM

## 2016-05-10 MED ORDER — CYANOCOBALAMIN 1000 MCG/ML IJ SOLN
1000.0000 ug | Freq: Once | INTRAMUSCULAR | Status: AC
  Administered 2016-05-10: 1000 ug via INTRAMUSCULAR

## 2016-06-08 ENCOUNTER — Other Ambulatory Visit: Payer: Self-pay | Admitting: Family Medicine

## 2016-06-08 DIAGNOSIS — I10 Essential (primary) hypertension: Secondary | ICD-10-CM

## 2016-06-14 ENCOUNTER — Ambulatory Visit (INDEPENDENT_AMBULATORY_CARE_PROVIDER_SITE_OTHER): Payer: Medicare Other

## 2016-06-14 DIAGNOSIS — E538 Deficiency of other specified B group vitamins: Secondary | ICD-10-CM | POA: Diagnosis not present

## 2016-06-14 MED ORDER — CYANOCOBALAMIN 1000 MCG/ML IJ SOLN
1000.0000 ug | Freq: Once | INTRAMUSCULAR | Status: AC
  Administered 2016-06-14: 1000 ug via INTRAMUSCULAR

## 2016-07-05 ENCOUNTER — Other Ambulatory Visit: Payer: Self-pay | Admitting: Family Medicine

## 2016-07-05 DIAGNOSIS — I10 Essential (primary) hypertension: Secondary | ICD-10-CM

## 2016-07-18 ENCOUNTER — Encounter: Payer: Self-pay | Admitting: Family Medicine

## 2016-07-18 ENCOUNTER — Ambulatory Visit (INDEPENDENT_AMBULATORY_CARE_PROVIDER_SITE_OTHER): Payer: Medicare Other | Admitting: Family Medicine

## 2016-07-18 VITALS — BP 112/68 | HR 72 | Temp 98.1°F | Resp 16 | Wt 150.0 lb

## 2016-07-18 DIAGNOSIS — D531 Other megaloblastic anemias, not elsewhere classified: Secondary | ICD-10-CM

## 2016-07-18 DIAGNOSIS — E7800 Pure hypercholesterolemia, unspecified: Secondary | ICD-10-CM

## 2016-07-18 DIAGNOSIS — R7303 Prediabetes: Secondary | ICD-10-CM

## 2016-07-18 DIAGNOSIS — E039 Hypothyroidism, unspecified: Secondary | ICD-10-CM

## 2016-07-18 DIAGNOSIS — Z23 Encounter for immunization: Secondary | ICD-10-CM

## 2016-07-18 DIAGNOSIS — E538 Deficiency of other specified B group vitamins: Secondary | ICD-10-CM

## 2016-07-18 DIAGNOSIS — I10 Essential (primary) hypertension: Secondary | ICD-10-CM | POA: Diagnosis not present

## 2016-07-18 LAB — POCT GLYCOSYLATED HEMOGLOBIN (HGB A1C)
ESTIMATED AVERAGE GLUCOSE: 131
HEMOGLOBIN A1C: 6.2

## 2016-07-18 MED ORDER — CYANOCOBALAMIN 1000 MCG/ML IJ SOLN
1000.0000 ug | Freq: Once | INTRAMUSCULAR | Status: AC
  Administered 2016-07-18: 1000 ug via INTRAMUSCULAR

## 2016-07-18 NOTE — Progress Notes (Signed)
Patient: Michaela Weber Female    DOB: [birth year]   70 y.o.   MRN: [id number] Visit Date: 07/18/2016  Today's Provider: Lelon Huh, MD   Chief Complaint  Patient presents with  . Hypertension  . Hyperlipidemia  . B12 Injection  . Hypothyroidism   Subjective:    HPI  This is a previous patient of Dr. Venia Minks present today as new patient to me to establish care and follow up on chronic medical problems.    Hypertension, follow-up:  BP Readings from Last 3 Encounters:  07/18/16 112/68  04/07/16 106/73  02/16/16 110/72    She was last seen for hypertension 5 months ago.  BP at that visit was 106/73. Management since that visit includes no changes. She reports good compliance with treatment. She is not having side effects.  She is not exercising. She is adherent to low salt diet.   Outside blood pressures are not being checked. Patient denies chest pain, exertional chest pressure/discomfort, lower extremity edema, palpitations and tachypnea.   Cardiovascular risk factors include dyslipidemia.    Weight trend: stable Wt Readings from Last 3 Encounters:  07/18/16 150 lb (68 kg)  04/07/16 142 lb (64.4 kg)  02/16/16 150 lb (68 kg)    Current diet: well balanced    Lipid/Cholesterol, Follow-up:   Last seen for this5 months ago.  Management changes since that visit include no changes. . Last Lipid Panel:    Component Value Date/Time   CHOL 194 02/18/2016 0841   TRIG 115 02/18/2016 0841   HDL 54 02/18/2016 0841   LDLCALC 117 (H) 02/18/2016 0841    Risk factors for vascular disease include hypertension  She reports good compliance with treatment. She is not having side effects.  Current symptoms include none and have been stable. Weight trend: stable Prior visit with dietician: no Current diet: well balanced Current exercise: none  Wt Readings from Last 3 Encounters:  07/18/16 150 lb (68 kg)  04/07/16 142 lb (64.4 kg)  02/16/16 150 lb (68 kg)      Prediabetes, Follow-up:   Lab Results  Component Value Date   HGBA1C 6.2 07/18/2016   HGBA1C 6.3 (H) 02/18/2016   GLUCOSE 109 (H) 02/18/2016    Last seen for for this5 months ago.  Management since that visit includes no changes. Current symptoms include none and have been stable.  Weight trend: stable Prior visit with dietician: no Current diet: well balanced Current exercise: none  Pertinent Labs:    Component Value Date/Time   CHOL 194 02/18/2016 0841   TRIG 115 02/18/2016 0841   CREATININE 0.63 02/18/2016 0841    Wt Readings from Last 3 Encounters:  07/18/16 150 lb (68 kg)  04/07/16 142 lb (64.4 kg)  02/16/16 150 lb (68 kg)   Hypothyroidism, follow up: Patient was last seen 5 months ago. Since then her levothyroxine was increased to 161mg. Patient had her TSH checked on 04/19/2016 and it was WNL.     Anemia due to Vit. B12 deficiency, follow up: Patient was last seen 5 months ago, and everything was stable. Patient is due for her monthly B12 injection today.   Allergies  Allergen Reactions  . Codeine Nausea And Vomiting     Current Outpatient Prescriptions:  .  aspirin 81 MG EC tablet, Take 81 mg by mouth daily.  , Disp: , Rfl:  .  Cyanocobalamin (B-12 COMPLIANCE INJECTION) 1000 MCG/ML KIT, Inject 1 mL as directed every 30 (thirty) days.,  Disp: , Rfl:  .  levothyroxine (SYNTHROID, LEVOTHROID) 125 MCG tablet, Take 1 tablet (125 mcg total) by mouth daily., Disp: 90 tablet, Rfl: 1 .  lisinopril-hydrochlorothiazide (PRINZIDE,ZESTORETIC) 10-12.5 MG tablet, TAKE 1 TABLET BY MOUTH EVERY DAY, Disp: 90 tablet, Rfl: 1 .  Na Sulfate-K Sulfate-Mg Sulf (SUPREP BOWEL PREP KIT) 17.5-3.13-1.6 GM/180ML SOLN, Take 1 kit by mouth as directed., Disp: 1 Bottle, Rfl: 0 .  polyethylene glycol-electrolytes (TRILYTE) 420 g solution, Take 4,000 mLs by mouth as directed. Drink one 8 oz glass every 20 mins until stools are clear., Disp: 4000 mL, Rfl: 0 .  VITAMIN D, CHOLECALCIFEROL,  PO, Take 1 capsule by mouth daily. , Disp: , Rfl:   Current Facility-Administered Medications:  .  cyanocobalamin ((VITAMIN B-12)) injection 1,000 mcg, 1,000 mcg, Intramuscular, Once, Birdie Sons, MD  Review of Systems  Constitutional: Negative.   Respiratory: Negative.   Cardiovascular: Negative.   Endocrine: Negative.   Musculoskeletal: Negative.   Neurological: Negative.   Psychiatric/Behavioral: Negative.     Social History  Substance Use Topics  . Smoking status: Never Smoker  . Smokeless tobacco: Never Used     Comment: tobacco use - no  . Alcohol use No   Objective:   BP 112/68 (BP Location: Right Arm, Patient Position: Sitting, Cuff Size: Normal)   Pulse 72   Temp 98.1 F (36.7 C)   Resp 16   Wt 150 lb (68 kg)   BMI 27.44 kg/m   Physical Exam  General appearance: alert, well developed, well nourished, cooperative and in no distress Head: Normocephalic, without obvious abnormality, atraumatic Respiratory: Respirations even and unlabored, normal respiratory rate Extremities: No gross deformities Skin: Skin color, texture, turgor normal. No rashes seen  Psych: Appropriate mood and affect. Neurologic: Mental status: Alert, oriented to person, place, and time, thought content appropriate.  Results for orders placed or performed in visit on 07/18/16  POCT glycosylated hemoglobin (Hb A1C)  Result Value Ref Range   Hemoglobin A1C 6.2    Est. average glucose Bld gHb Est-mCnc 131        Assessment & Plan:     1. Essential hypertension Well controlled.  Continue current medications.    2. Adult hypothyroidism Doing well on current thyroid replacement - TSH  3. Pure hypercholesterolemia Diet controlled.   4. Megaloblastic anemia due to B12 deficiency Doing well on current B12 injections.  - cyanocobalamin ((VITAMIN B-12)) injection 1,000 mcg; Inject 1 mL (1,000 mcg total) into the muscle once.  5. Pre-diabetes  - POCT glycosylated hemoglobin (Hb  A1C)  6. Need for influenza vaccination  - Flu vaccine HIGH DOSE PF (Fluzone High dose)  7. B12 deficiency  - Vitamin B12  Return in about 6 months (around 01/16/2017).       Lelon Huh, MD  [facility]

## 2016-07-19 ENCOUNTER — Telehealth: Payer: Self-pay

## 2016-07-19 LAB — VITAMIN B12

## 2016-07-19 LAB — TSH: TSH: 0.568 u[IU]/mL (ref 0.450–4.500)

## 2016-07-19 NOTE — Telephone Encounter (Signed)
-----   Message from Birdie Sons, MD sent at 07/19/2016  7:41 AM EDT ----- Thyroid levels are stable. Continue current medications. Follow up for annual wellness exam in May.

## 2016-07-19 NOTE — Telephone Encounter (Signed)
Pt advised.   Thanks,   -Mariem Skolnick  

## 2016-08-15 ENCOUNTER — Ambulatory Visit (INDEPENDENT_AMBULATORY_CARE_PROVIDER_SITE_OTHER): Payer: Medicare Other | Admitting: Family Medicine

## 2016-08-15 DIAGNOSIS — D531 Other megaloblastic anemias, not elsewhere classified: Secondary | ICD-10-CM

## 2016-08-15 MED ORDER — CYANOCOBALAMIN 1000 MCG/ML IJ SOLN
1000.0000 ug | Freq: Once | INTRAMUSCULAR | Status: AC
  Administered 2016-08-15: 1000 ug via INTRAMUSCULAR

## 2016-08-15 NOTE — Progress Notes (Signed)
B12 injection administered today. Given in left upper quadrant.

## 2016-08-16 ENCOUNTER — Other Ambulatory Visit: Payer: Self-pay | Admitting: Family Medicine

## 2016-08-16 DIAGNOSIS — E039 Hypothyroidism, unspecified: Secondary | ICD-10-CM

## 2016-08-16 NOTE — Telephone Encounter (Signed)
[  please review-aa 

## 2016-09-13 ENCOUNTER — Ambulatory Visit (INDEPENDENT_AMBULATORY_CARE_PROVIDER_SITE_OTHER): Payer: Medicare Other

## 2016-09-13 DIAGNOSIS — D531 Other megaloblastic anemias, not elsewhere classified: Secondary | ICD-10-CM

## 2016-09-13 MED ORDER — CYANOCOBALAMIN 1000 MCG/ML IJ KIT: 1.0000 mL | PACK | INTRAMUSCULAR | 0 refills | Status: DC

## 2016-09-13 MED ORDER — CYANOCOBALAMIN 1000 MCG/ML IJ SOLN
1000.0000 ug | Freq: Once | INTRAMUSCULAR | Status: AC
  Administered 2016-09-13: 1000 ug via INTRAMUSCULAR

## 2016-10-11 ENCOUNTER — Ambulatory Visit: Payer: Self-pay

## 2016-10-18 ENCOUNTER — Ambulatory Visit (INDEPENDENT_AMBULATORY_CARE_PROVIDER_SITE_OTHER): Payer: Medicare Other

## 2016-10-18 DIAGNOSIS — E538 Deficiency of other specified B group vitamins: Secondary | ICD-10-CM

## 2016-10-18 MED ORDER — CYANOCOBALAMIN 1000 MCG/ML IJ SOLN
1000.0000 ug | Freq: Once | INTRAMUSCULAR | Status: AC
  Administered 2016-10-18: 1000 ug via INTRAMUSCULAR

## 2016-11-15 ENCOUNTER — Ambulatory Visit (INDEPENDENT_AMBULATORY_CARE_PROVIDER_SITE_OTHER): Payer: Medicare Other

## 2016-11-15 DIAGNOSIS — D531 Other megaloblastic anemias, not elsewhere classified: Secondary | ICD-10-CM

## 2016-11-15 MED ORDER — CYANOCOBALAMIN 1000 MCG/ML IJ SOLN
1000.0000 ug | Freq: Once | INTRAMUSCULAR | Status: AC
  Administered 2016-11-15: 1000 ug via INTRAMUSCULAR

## 2016-12-13 ENCOUNTER — Ambulatory Visit: Payer: Self-pay

## 2016-12-15 ENCOUNTER — Ambulatory Visit (INDEPENDENT_AMBULATORY_CARE_PROVIDER_SITE_OTHER): Payer: Medicare Other

## 2016-12-15 DIAGNOSIS — D531 Other megaloblastic anemias, not elsewhere classified: Secondary | ICD-10-CM | POA: Diagnosis not present

## 2016-12-15 MED ORDER — CYANOCOBALAMIN 1000 MCG/ML IJ SOLN
1000.0000 ug | Freq: Once | INTRAMUSCULAR | Status: AC
  Administered 2016-12-15: 1000 ug via INTRAMUSCULAR

## 2017-01-17 ENCOUNTER — Ambulatory Visit (INDEPENDENT_AMBULATORY_CARE_PROVIDER_SITE_OTHER): Payer: Medicare Other | Admitting: Family Medicine

## 2017-01-17 ENCOUNTER — Ambulatory Visit: Payer: Medicare Other | Admitting: Family Medicine

## 2017-01-17 ENCOUNTER — Encounter: Payer: Self-pay | Admitting: Family Medicine

## 2017-01-17 VITALS — BP 110/78 | HR 92 | Temp 98.1°F | Resp 16 | Wt 152.0 lb

## 2017-01-17 DIAGNOSIS — E559 Vitamin D deficiency, unspecified: Secondary | ICD-10-CM | POA: Diagnosis not present

## 2017-01-17 DIAGNOSIS — E039 Hypothyroidism, unspecified: Secondary | ICD-10-CM

## 2017-01-17 DIAGNOSIS — I10 Essential (primary) hypertension: Secondary | ICD-10-CM

## 2017-01-17 DIAGNOSIS — R7303 Prediabetes: Secondary | ICD-10-CM | POA: Diagnosis not present

## 2017-01-17 DIAGNOSIS — E7800 Pure hypercholesterolemia, unspecified: Secondary | ICD-10-CM | POA: Diagnosis not present

## 2017-01-17 DIAGNOSIS — D531 Other megaloblastic anemias, not elsewhere classified: Secondary | ICD-10-CM

## 2017-01-17 LAB — POCT GLYCOSYLATED HEMOGLOBIN (HGB A1C)
Est. average glucose Bld gHb Est-mCnc: 134
HEMOGLOBIN A1C: 6.3

## 2017-01-17 MED ORDER — CYANOCOBALAMIN 1000 MCG/ML IJ SOLN
1000.0000 ug | Freq: Once | INTRAMUSCULAR | Status: AC
  Administered 2017-01-17: 1000 ug via INTRAMUSCULAR

## 2017-01-17 NOTE — Progress Notes (Signed)
Patient: Michaela Weber Female    DOB: 05/12/1946   71 y.o.   MRN: [id number] Visit Date: 01/17/2017  Today's Provider: Lelon Huh, MD   Chief Complaint  Patient presents with  . Hypertension    follow up  . Hypothyroidism    follow up  . Hyperlipidemia    follow up  . Anemia    follow up  . Hyperglycemia    follow up   Subjective:    HPI  Hypertension, follow-up:  BP Readings from Last 3 Encounters:  07/18/16 112/68  04/07/16 106/73  02/16/16 110/72    She was last seen for hypertension 6 months ago.  BP at that visit was 112/68. Management since that visit includes no changes. She reports good compliance with treatment. She is not having side effects.  She is not exercising. She is not adherent to low salt diet.   Outside blood pressures are not being checked. She is experiencing none.  Patient denies chest pain, chest pressure/discomfort, claudication, dyspnea, exertional chest pressure/discomfort, fatigue, irregular heart beat, lower extremity edema, near-syncope, orthopnea, palpitations, paroxysmal nocturnal dyspnea, syncope and tachypnea.   Cardiovascular risk factors include advanced age (older than 46 for men, 5 for women), dyslipidemia and hypertension.  Use of agents associated with hypertension: NSAIDS.     Weight trend: stable Wt Readings from Last 3 Encounters:  07/18/16 150 lb (68 kg)  04/07/16 142 lb (64.4 kg)  02/16/16 150 lb (68 kg)    Current diet: well balanced  ------------------------------------------------------------------------ Follow up of hypothyroidism:  Patient was last seen for this problem 6 months ago and no changes were made. Patient reports good compliance with treatment. Last tsh in October was 0.568. Feels energy level is normal     Lipid/Cholesterol, Follow-up:   Last seen for this 6 months ago.  Management changes since that visit include none. . Last Lipid Panel:    Component Value Date/Time   CHOL  194 02/18/2016 0841   TRIG 115 02/18/2016 0841   HDL 54 02/18/2016 0841   LDLCALC 117 (H) 02/18/2016 0841    Risk factors for vascular disease include hypercholesterolemia and hypertension  She reports good compliance with treatment. She is not having side effects.  Current symptoms include none and have been stable. Weight trend: stable Prior visit with dietician: no Current diet: well balanced Current exercise: none  Wt Readings from Last 3 Encounters:  07/18/16 150 lb (68 kg)  04/07/16 142 lb (64.4 kg)  02/16/16 150 lb (68 kg)    ------------------------------------------------------------------- Follow up of Anemia due to B12 Deficiency:  Patient was last seen for this problem 6 months ago and no changes were made.    Hyperglycemia, Follow-up:   Lab Results  Component Value Date   HGBA1C 6.2 07/18/2016   HGBA1C 6.3 (H) 02/18/2016   GLUCOSE 109 (H) 02/18/2016    Last seen for for this 6 months ago.  Management since then includes no changes. Current symptoms include none and have been stable.  Weight trend: stable Prior visit with dietician: no Current diet: well balanced Current exercise: none  Pertinent Labs:    Component Value Date/Time   CHOL 194 02/18/2016 0841   TRIG 115 02/18/2016 0841   CREATININE 0.63 02/18/2016 0841    Wt Readings from Last 3 Encounters:  07/18/16 150 lb (68 kg)  04/07/16 142 lb (64.4 kg)  02/16/16 150 lb (68 kg)       Allergies  Allergen Reactions  .  Codeine Nausea And Vomiting     Current Outpatient Prescriptions:  .  aspirin 81 MG EC tablet, Take 81 mg by mouth daily.  , Disp: , Rfl:  .  Cyanocobalamin (B-12 COMPLIANCE INJECTION) 1000 MCG/ML KIT, Inject 1 mL as directed every 30 (thirty) days., Disp: 1 kit, Rfl: 0 .  levothyroxine (SYNTHROID, LEVOTHROID) 125 MCG tablet, TAKE 1 TABLET(125 MCG) BY MOUTH DAILY, Disp: 90 tablet, Rfl: 4 .  lisinopril-hydrochlorothiazide (PRINZIDE,ZESTORETIC) 10-12.5 MG tablet, TAKE 1  TABLET BY MOUTH EVERY DAY, Disp: 90 tablet, Rfl: 1 .  Na Sulfate-K Sulfate-Mg Sulf (SUPREP BOWEL PREP KIT) 17.5-3.13-1.6 GM/180ML SOLN, Take 1 kit by mouth as directed., Disp: 1 Bottle, Rfl: 0 .  VITAMIN D, CHOLECALCIFEROL, PO, Take 1 capsule by mouth daily. , Disp: , Rfl:   Review of Systems  Constitutional: Negative for appetite change, chills, fatigue and fever.  Respiratory: Negative for chest tightness and shortness of breath.   Cardiovascular: Negative for chest pain and palpitations.  Gastrointestinal: Negative for abdominal pain, nausea and vomiting.  Endocrine: Negative for cold intolerance, heat intolerance, polydipsia, polyphagia and polyuria.  Neurological: Negative for dizziness and weakness.    Social History  Substance Use Topics  . Smoking status: Never Smoker  . Smokeless tobacco: Never Used     Comment: tobacco use - no  . Alcohol use No   Objective:   BP 110/78 (BP Location: Left Arm, Patient Position: Sitting, Cuff Size: Normal)   Pulse 92   Temp 98.1 F (36.7 C) (Oral)   Resp 16   Wt 152 lb (68.9 kg)   SpO2 95% Comment: room air  BMI 27.80 kg/m  There were no vitals filed for this visit.   Physical Exam   General Appearance:    Alert, cooperative, no distress  Eyes:    PERRL, conjunctiva/corneas clear, EOM's intact       Lungs:     Clear to auscultation bilaterally, respirations unlabored  Heart:    Regular rate and rhythm  Neurologic:   Awake, alert, oriented x 3. No apparent focal neurological           defect.       Lab Results  Component Value Date   HGBA1C 6.3 01/17/2017        Assessment & Plan:     1. Pre-diabetes Doing well with diet.  - POCT HgB A1C  2. Adult hypothyroidism  - T4 AND TSH  3. Vitamin D deficiency  - Vitamin D (25 hydroxy)  4. Megaloblastic anemia due to B12 deficiency  - cyanocobalamin ((VITAMIN B-12)) injection 1,000 mcg; Inject 1 mL (1,000 mcg total) into the muscle once. - CBC  5. Pure  hypercholesterolemia Diet controlled.  - Lipid Profile - Comprehensive Metabolic Panel (CMET)  6. Essential hypertension Well controlled.  Continue current medications.     The entirety of the information documented in the History of Present Illness, Review of Systems and Physical Exam were personally obtained by me. Portions of this information were initially documented by Meyer Cory, CMA and reviewed by me for thoroughness and accuracy.        Lelon Huh, MD  [facility]

## 2017-01-18 DIAGNOSIS — E7800 Pure hypercholesterolemia, unspecified: Secondary | ICD-10-CM | POA: Diagnosis not present

## 2017-01-18 DIAGNOSIS — E039 Hypothyroidism, unspecified: Secondary | ICD-10-CM | POA: Diagnosis not present

## 2017-01-18 DIAGNOSIS — D531 Other megaloblastic anemias, not elsewhere classified: Secondary | ICD-10-CM | POA: Diagnosis not present

## 2017-01-18 DIAGNOSIS — E559 Vitamin D deficiency, unspecified: Secondary | ICD-10-CM | POA: Diagnosis not present

## 2017-01-19 LAB — LIPID PANEL
CHOL/HDL RATIO: 3.7 ratio (ref 0.0–4.4)
Cholesterol, Total: 191 mg/dL (ref 100–199)
HDL: 51 mg/dL (ref >39)
LDL CALC: 121 mg/dL — AB (ref 0–99)
TRIGLYCERIDES: 97 mg/dL (ref 0–149)
VLDL CHOLESTEROL CAL: 19 mg/dL (ref 5–40)

## 2017-01-19 LAB — CBC
Hematocrit: 39.4 % (ref 34.0–46.6)
Hemoglobin: 13 g/dL (ref 11.1–15.9)
MCH: 25 pg — AB (ref 26.6–33.0)
MCHC: 33 g/dL (ref 31.5–35.7)
MCV: 76 fL — AB (ref 79–97)
PLATELETS: 305 10*3/uL (ref 150–379)
RBC: 5.19 x10E6/uL (ref 3.77–5.28)
RDW: 15 % (ref 12.3–15.4)
WBC: 8 10*3/uL (ref 3.4–10.8)

## 2017-01-19 LAB — COMPREHENSIVE METABOLIC PANEL
A/G RATIO: 1.5 (ref 1.2–2.2)
ALK PHOS: 122 IU/L — AB (ref 39–117)
ALT: 17 IU/L (ref 0–32)
AST: 16 IU/L (ref 0–40)
Albumin: 4.4 g/dL (ref 3.5–4.8)
BILIRUBIN TOTAL: 0.5 mg/dL (ref 0.0–1.2)
BUN/Creatinine Ratio: 21 (ref 12–28)
BUN: 14 mg/dL (ref 8–27)
CHLORIDE: 98 mmol/L (ref 96–106)
CO2: 28 mmol/L (ref 18–29)
Calcium: 10 mg/dL (ref 8.7–10.3)
Creatinine, Ser: 0.66 mg/dL (ref 0.57–1.00)
GFR calc non Af Amer: 90 mL/min/{1.73_m2} (ref >59)
GFR, EST AFRICAN AMERICAN: 104 mL/min/{1.73_m2} (ref >59)
Globulin, Total: 3 g/dL (ref 1.5–4.5)
Glucose: 122 mg/dL — ABNORMAL HIGH (ref 65–99)
POTASSIUM: 4.5 mmol/L (ref 3.5–5.2)
Sodium: 142 mmol/L (ref 134–144)
TOTAL PROTEIN: 7.4 g/dL (ref 6.0–8.5)

## 2017-01-19 LAB — T4 AND TSH
T4, Total: 8.7 ug/dL (ref 4.5–12.0)
TSH: 0.572 u[IU]/mL (ref 0.450–4.500)

## 2017-01-19 LAB — VITAMIN D 25 HYDROXY (VIT D DEFICIENCY, FRACTURES): Vit D, 25-Hydroxy: 37.8 ng/mL (ref 30.0–100.0)

## 2017-01-23 ENCOUNTER — Other Ambulatory Visit: Payer: Self-pay | Admitting: Family Medicine

## 2017-01-23 DIAGNOSIS — I10 Essential (primary) hypertension: Secondary | ICD-10-CM

## 2017-01-29 ENCOUNTER — Other Ambulatory Visit: Payer: Self-pay | Admitting: Family Medicine

## 2017-01-29 DIAGNOSIS — Z1231 Encounter for screening mammogram for malignant neoplasm of breast: Secondary | ICD-10-CM

## 2017-02-16 ENCOUNTER — Ambulatory Visit (INDEPENDENT_AMBULATORY_CARE_PROVIDER_SITE_OTHER): Payer: Medicare Other

## 2017-02-16 DIAGNOSIS — D531 Other megaloblastic anemias, not elsewhere classified: Secondary | ICD-10-CM | POA: Diagnosis not present

## 2017-02-16 MED ORDER — CYANOCOBALAMIN 1000 MCG/ML IJ SOLN
1000.0000 ug | Freq: Once | INTRAMUSCULAR | Status: AC
  Administered 2017-02-16: 1000 ug via INTRAMUSCULAR

## 2017-03-01 ENCOUNTER — Ambulatory Visit: Payer: Medicare Other

## 2017-03-16 ENCOUNTER — Ambulatory Visit (INDEPENDENT_AMBULATORY_CARE_PROVIDER_SITE_OTHER): Payer: Medicare Other

## 2017-03-16 DIAGNOSIS — D531 Other megaloblastic anemias, not elsewhere classified: Secondary | ICD-10-CM

## 2017-03-16 MED ORDER — CYANOCOBALAMIN 1000 MCG/ML IJ SOLN
1000.0000 ug | Freq: Once | INTRAMUSCULAR | Status: AC
  Administered 2017-03-16: 1000 ug via INTRAMUSCULAR

## 2017-03-23 ENCOUNTER — Ambulatory Visit
Admission: RE | Admit: 2017-03-23 | Discharge: 2017-03-23 | Disposition: A | Discharge status: Home / Self Care | Payer: Medicare Other | Source: Ambulatory Visit | Attending: Family Medicine | Admitting: Family Medicine

## 2017-03-23 DIAGNOSIS — Z1231 Encounter for screening mammogram for malignant neoplasm of breast: Secondary | ICD-10-CM | POA: Insufficient documentation

## 2017-04-13 ENCOUNTER — Ambulatory Visit: Payer: Medicare Other

## 2017-04-18 ENCOUNTER — Ambulatory Visit (INDEPENDENT_AMBULATORY_CARE_PROVIDER_SITE_OTHER): Payer: Medicare Other

## 2017-04-18 DIAGNOSIS — D531 Other megaloblastic anemias, not elsewhere classified: Secondary | ICD-10-CM

## 2017-04-18 MED ORDER — CYANOCOBALAMIN 1000 MCG/ML IJ SOLN
1000.0000 ug | Freq: Once | INTRAMUSCULAR | Status: AC
  Administered 2017-04-18: 1000 ug via INTRAMUSCULAR

## 2017-05-14 ENCOUNTER — Telehealth: Payer: Self-pay | Admitting: Family Medicine

## 2017-05-14 NOTE — Telephone Encounter (Signed)
Patient reached for AWV scheduling and thought she had October appt with Dr. Caryn Section.  There are no upcoming MD appts and she would like to know when she is supposed to follow up with him-ab

## 2017-05-14 NOTE — Telephone Encounter (Signed)
Per last office note patient needed to follow up in 6 months around 07/19/17. Patient advised. 6 month follow up scheduled with Dr. Brita Romp per patient's request.

## 2017-05-16 ENCOUNTER — Ambulatory Visit: Payer: Self-pay

## 2017-05-16 ENCOUNTER — Ambulatory Visit (INDEPENDENT_AMBULATORY_CARE_PROVIDER_SITE_OTHER): Payer: Medicare Other

## 2017-05-16 VITALS — BP 142/84 | HR 88 | Temp 97.8°F | Ht 62.0 in | Wt 151.0 lb

## 2017-05-16 DIAGNOSIS — D531 Other megaloblastic anemias, not elsewhere classified: Secondary | ICD-10-CM | POA: Diagnosis not present

## 2017-05-16 DIAGNOSIS — Z0000 Encounter for general adult medical examination without abnormal findings: Secondary | ICD-10-CM

## 2017-05-16 MED ORDER — CYANOCOBALAMIN 1000 MCG/ML IJ SOLN
1000.0000 ug | Freq: Once | INTRAMUSCULAR | Status: AC
  Administered 2017-05-16: 1000 ug via INTRAMUSCULAR

## 2017-05-16 NOTE — Patient Instructions (Signed)
Michaela Weber , Thank you for taking time to come for your Medicare Wellness Visit. I appreciate your ongoing commitment to your health goals. Please review the following plan we discussed and let me know if I can assist you in the future.   Screening recommendations/referrals: Colonoscopy: up to date, due 03/2019 Mammogram: up to date, due 03/23/17 Bone Density: up to date Recommended yearly ophthalmology/optometry visit for glaucoma screening and checkup Recommended yearly dental visit for hygiene and checkup  Vaccinations: Influenza vaccine: declined, will receive later in the fall Pneumococcal vaccine: completed series Tdap vaccine: declined Shingles vaccine: declined   Advanced directives: Advance directive discussed with you today. I have provided a copy for you to complete at home and have notarized. Once this is complete please bring a copy in to our office so we can scan it into your chart.  Conditions/risks identified:Recommend increasing water intake to 4-6 glasses a day.    Next appointment: 07/19/17   Preventive Care 65 Years and Older, Female Preventive care refers to lifestyle choices and visits with your health care provider that can promote health and wellness. What does preventive care include?  A yearly physical exam. This is also called an annual well check.  Dental exams once or twice a year.  Routine eye exams. Ask your health care provider how often you should have your eyes checked.  Personal lifestyle choices, including:  Daily care of your teeth and gums.  Regular physical activity.  Eating a healthy diet.  Avoiding tobacco and drug use.  Limiting alcohol use.  Practicing safe sex.  Taking low-dose aspirin every day.  Taking vitamin and mineral supplements as recommended by your health care provider. What happens during an annual well check? The services and screenings done by your health care provider during your annual well check will depend  on your age, overall health, lifestyle risk factors, and family history of disease. Counseling  Your health care provider may ask you questions about your:  Alcohol use.  Tobacco use.  Drug use.  Emotional well-being.  Home and relationship well-being.  Sexual activity.  Eating habits.  History of falls.  Memory and ability to understand (cognition).  Work and work Statistician.  Reproductive health. Screening  You may have the following tests or measurements:  Height, weight, and BMI.  Blood pressure.  Lipid and cholesterol levels. These may be checked every 5 years, or more frequently if you are over 74 years old.  Skin check.  Lung cancer screening. You may have this screening every year starting at age 51 if you have a 30-pack-year history of smoking and currently smoke or have quit within the past 15 years.  Fecal occult blood test (FOBT) of the stool. You may have this test every year starting at age 54.  Flexible sigmoidoscopy or colonoscopy. You may have a sigmoidoscopy every 5 years or a colonoscopy every 10 years starting at age 66.  Hepatitis C blood test.  Hepatitis B blood test.  Sexually transmitted disease (STD) testing.  Diabetes screening. This is done by checking your blood sugar (glucose) after you have not eaten for a while (fasting). You may have this done every 1-3 years.  Bone density scan. This is done to screen for osteoporosis. You may have this done starting at age 37.  Mammogram. This may be done every 1-2 years. Talk to your health care provider about how often you should have regular mammograms. Talk with your health care provider about your test results, treatment  options, and if necessary, the need for more tests. Vaccines  Your health care provider may recommend certain vaccines, such as:  Influenza vaccine. This is recommended every year.  Tetanus, diphtheria, and acellular pertussis (Tdap, Td) vaccine. You may need a Td  booster every 10 years.  Zoster vaccine. You may need this after age 56.  Pneumococcal 13-valent conjugate (PCV13) vaccine. One dose is recommended after age 97.  Pneumococcal polysaccharide (PPSV23) vaccine. One dose is recommended after age 77. Talk to your health care provider about which screenings and vaccines you need and how often you need them. This information is not intended to replace advice given to you by your health care provider. Make sure you discuss any questions you have with your health care provider. Document Released: 10/08/2015 Document Revised: 05/31/2016 Document Reviewed: 07/13/2015 Elsevier Interactive Patient Education  2017 [place] Prevention in the Home Falls can cause injuries. They can happen to people of all ages. There are many things you can do to make your home safe and to help prevent falls. What can I do on the outside of my home?  Regularly fix the edges of walkways and driveways and fix any cracks.  Remove anything that might make you trip as you walk through a door, such as a raised step or threshold.  Trim any bushes or trees on the path to your home.  Use bright outdoor lighting.  Clear any walking paths of anything that might make someone trip, such as rocks or tools.  Regularly check to see if handrails are loose or broken. Make sure that both sides of any steps have handrails.  Any raised decks and porches should have guardrails on the edges.  Have any leaves, snow, or ice cleared regularly.  Use sand or salt on walking paths during winter.  Clean up any spills in your garage right away. This includes oil or grease spills. What can I do in the bathroom?  Use night lights.  Install grab bars by the toilet and in the tub and shower. Do not use towel bars as grab bars.  Use non-skid mats or decals in the tub or shower.  If you need to sit down in the shower, use a plastic, non-slip stool.  Keep the floor dry. Clean up  any water that spills on the floor as soon as it happens.  Remove soap buildup in the tub or shower regularly.  Attach bath mats securely with double-sided non-slip rug tape.  Do not have throw rugs and other things on the floor that can make you trip. What can I do in the bedroom?  Use night lights.  Make sure that you have a light by your bed that is easy to reach.  Do not use any sheets or blankets that are too big for your bed. They should not hang down onto the floor.  Have a firm chair that has side arms. You can use this for support while you get dressed.  Do not have throw rugs and other things on the floor that can make you trip. What can I do in the kitchen?  Clean up any spills right away.  Avoid walking on wet floors.  Keep items that you use a lot in easy-to-reach places.  If you need to reach something above you, use a strong step stool that has a grab bar.  Keep electrical cords out of the way.  Do not use floor polish or wax that makes floors slippery.  If you must use wax, use non-skid floor wax.  Do not have throw rugs and other things on the floor that can make you trip. What can I do with my stairs?  Do not leave any items on the stairs.  Make sure that there are handrails on both sides of the stairs and use them. Fix handrails that are broken or loose. Make sure that handrails are as long as the stairways.  Check any carpeting to make sure that it is firmly attached to the stairs. Fix any carpet that is loose or worn.  Avoid having throw rugs at the top or bottom of the stairs. If you do have throw rugs, attach them to the floor with carpet tape.  Make sure that you have a light switch at the top of the stairs and the bottom of the stairs. If you do not have them, ask someone to add them for you. What else can I do to help prevent falls?  Wear shoes that:  Do not have high heels.  Have rubber bottoms.  Are comfortable and fit you well.  Are  closed at the toe. Do not wear sandals.  If you use a stepladder:  Make sure that it is fully opened. Do not climb a closed stepladder.  Make sure that both sides of the stepladder are locked into place.  Ask someone to hold it for you, if possible.  Clearly mark and make sure that you can see:  Any grab bars or handrails.  First and last steps.  Where the edge of each step is.  Use tools that help you move around (mobility aids) if they are needed. These include:  Canes.  Walkers.  Scooters.  Crutches.  Turn on the lights when you go into a dark area. Replace any light bulbs as soon as they burn out.  Set up your furniture so you have a clear path. Avoid moving your furniture around.  If any of your floors are uneven, fix them.  If there are any pets around you, be aware of where they are.  Review your medicines with your doctor. Some medicines can make you feel dizzy. This can increase your chance of falling. Ask your doctor what other things that you can do to help prevent falls. This information is not intended to replace advice given to you by your health care provider. Make sure you discuss any questions you have with your health care provider. Document Released: 07/08/2009 Document Revised: 02/17/2016 Document Reviewed: 10/16/2014 Elsevier Interactive Patient Education  2017 [employer].

## 2017-05-16 NOTE — Progress Notes (Signed)
Subjective:   Michaela Weber is a 71 y.o. female who presents for Medicare Annual (Subsequent) preventive examination.  Review of Systems:  N/A Cardiac Risk Factors include: advanced age (>93mn, >>43women);dyslipidemia;hypertension     Objective:     Vitals: BP (!) 142/84 (BP Location: Left Arm)   Pulse 88   Temp 97.8 F (36.6 C) (Oral)   Ht _0  (1.575 m)   Wt 151 lb (68.5 kg)   BMI 27.62 kg/m   Body mass index is 27.62 kg/m.   Tobacco History  Smoking Status  . Never Smoker  Smokeless Tobacco  . Never Used    Comment: tobacco use - no     Counseling given: Not Answered   Past Medical History:  Diagnosis Date  . Anemia    in past  . Diastolic dysfunction   . Valvular disease    mild  . Wears contact lenses    Past Surgical History:  Procedure Laterality Date  . CESAREAN SECTION     x2   . COLONOSCOPY  approx 2007  . COLONOSCOPY WITH PROPOFOL N/A 04/07/2016   Procedure: COLONOSCOPY WITH PROPOFOL;  Surgeon: DLucilla Lame MD;  [facility]  Service: Endoscopy;  Laterality: N/A;  . POLYPECTOMY  04/07/2016   Procedure: POLYPECTOMY;  Surgeon: DLucilla Lame MD;  [facility]  Service: Endoscopy;;  . TONSILLECTOMY    . TUBAL LIGATION     with 2nd c-sect   Family History  Problem Relation Age of Onset  . Diabetes Father   . Mental retardation Mother    History  Sexual Activity  . Sexual activity: Not on file    Outpatient Encounter Prescriptions as of 05/16/2017  Medication Sig  . aspirin 81 MG EC tablet Take 81 mg by mouth daily.    . Cyanocobalamin (B-12 COMPLIANCE INJECTION) 1000 MCG/ML KIT Inject 1 mL as directed every 30 (thirty) days.  .Marland Kitchenlevothyroxine (SYNTHROID, LEVOTHROID) 125 MCG tablet TAKE 1 TABLET(125 MCG) BY MOUTH DAILY  . lisinopril-hydrochlorothiazide (PRINZIDE,ZESTORETIC) 10-12.5 MG tablet TAKE 1 TABLET BY MOUTH EVERY DAY  . VITAMIN D, CHOLECALCIFEROL, PO Take 2,000 Units by mouth daily.   .  [DISCONTINUED] Na Sulfate-K Sulfate-Mg Sulf (SUPREP BOWEL PREP KIT) 17.5-3.13-1.6 GM/180ML SOLN Take 1 kit by mouth as directed.   No facility-administered encounter medications on file as of 05/16/2017.     Activities of Daily Living In your present state of health, do you have any difficulty performing the following activities: 05/16/2017  Hearing? N  Vision? N  Difficulty concentrating or making decisions? N  Walking or climbing stairs? N  Dressing or bathing? N  Doing errands, shopping? N  Preparing Food and eating ? N  Using the Toilet? N  In the past six months, have you accidently leaked urine? Y  Comment occasionally  Do you have problems with loss of bowel control? N  Managing your Medications? N  Managing your Finances? N  Housekeeping or managing your Housekeeping? N  Some recent data might be hidden    Patient Care Team: BVirginia Crews MD as PCP - General (Family Medicine)    Assessment:     Exercise Activities and Dietary recommendations Current Exercise Habits: Home exercise routine, Type of exercise: Other - see comments (aerobics), Time (Minutes): 20, Frequency (Times/Week): 2, Weekly Exercise (Minutes/Week): 40, Intensity: Mild  Goals    . Increase water intake          Recommend increasing water intake to 4-6  glasses a day.       Fall Risk Fall Risk  05/16/2017 02/16/2016  Falls in the past year? No No   Depression Screen PHQ 2/9 Scores 05/16/2017 05/16/2017 02/16/2016  PHQ - 2 Score 0 0 0  PHQ- 9 Score 0 - -     Cognitive Function     6CIT Screen 05/16/2017  What Year? 0 points  What month? 0 points  What time? 0 points  Count back from 20 0 points  Months in reverse 0 points  Repeat phrase 0 points  Total Score 0    Immunization History  Administered Date(s) Administered  . Influenza, High Dose Seasonal PF 07/18/2016  . Influenza-Unspecified 06/22/2014  . Pneumococcal Conjugate-13 06/22/2014  . Pneumococcal Polysaccharide-23  11/24/2011   Screening Tests Health Maintenance  Topic Date Due  . INFLUENZA VACCINE  04/25/2017  . TETANUS/TDAP  09/25/2026 (Originally 03/20/1965)  . MAMMOGRAM  03/24/2019  . COLONOSCOPY  04/08/2019  . DEXA SCAN  Completed  . Hepatitis C Screening  Completed  . PNA vac Low Risk Adult  Completed      Plan:  I have personally reviewed and addressed the Medicare Annual Wellness questionnaire and have noted the following in the patient's chart:  A. Medical and social history B. Use of alcohol, tobacco or illicit drugs  C. Current medications and supplements D. Functional ability and status E.  Nutritional status F.  Physical activity G. Advance directives H. List of other physicians I.  Hospitalizations, surgeries, and ER visits in previous 12 months J.  [place] such as hearing and vision if needed, cognitive and depression L. Referrals and appointments - none  In addition, I have reviewed and discussed with patient certain preventive protocols, quality metrics, and best practice recommendations. A written personalized care plan for preventive services as well as general preventive health recommendations were provided to patient.  See attached scanned questionnaire for additional information.   Signed,  Fabio Neighbors, LPN Nurse Health Advisor   MD Recommendations: None. Pt declined tetanus vaccine today.

## 2017-06-13 ENCOUNTER — Ambulatory Visit (INDEPENDENT_AMBULATORY_CARE_PROVIDER_SITE_OTHER): Payer: Medicare Other

## 2017-06-13 DIAGNOSIS — D531 Other megaloblastic anemias, not elsewhere classified: Secondary | ICD-10-CM | POA: Diagnosis not present

## 2017-06-13 MED ORDER — CYANOCOBALAMIN 1000 MCG/ML IJ SOLN
1000.0000 ug | Freq: Once | INTRAMUSCULAR | Status: AC
Start: 2017-06-13 — End: 2017-06-13
  Administered 2017-06-13: 1000 ug via INTRAMUSCULAR

## 2017-07-19 ENCOUNTER — Encounter: Payer: Self-pay | Admitting: Family Medicine

## 2017-07-19 ENCOUNTER — Ambulatory Visit
Admission: RE | Admit: 2017-07-19 | Discharge: 2017-07-19 | Disposition: A | Discharge status: Home / Self Care | Payer: Medicare Other | Source: Ambulatory Visit | Attending: Family Medicine | Admitting: Family Medicine

## 2017-07-19 ENCOUNTER — Telehealth: Payer: Self-pay

## 2017-07-19 ENCOUNTER — Ambulatory Visit (INDEPENDENT_AMBULATORY_CARE_PROVIDER_SITE_OTHER): Payer: Medicare Other | Admitting: Family Medicine

## 2017-07-19 VITALS — BP 110/54 | HR 100 | Temp 98.0°F | Resp 16 | Wt 146.0 lb

## 2017-07-19 DIAGNOSIS — J449 Chronic obstructive pulmonary disease, unspecified: Secondary | ICD-10-CM | POA: Insufficient documentation

## 2017-07-19 DIAGNOSIS — R05 Cough: Secondary | ICD-10-CM | POA: Diagnosis not present

## 2017-07-19 DIAGNOSIS — E039 Hypothyroidism, unspecified: Secondary | ICD-10-CM

## 2017-07-19 DIAGNOSIS — D531 Other megaloblastic anemias, not elsewhere classified: Secondary | ICD-10-CM

## 2017-07-19 DIAGNOSIS — R7303 Prediabetes: Secondary | ICD-10-CM | POA: Diagnosis not present

## 2017-07-19 DIAGNOSIS — I10 Essential (primary) hypertension: Secondary | ICD-10-CM

## 2017-07-19 DIAGNOSIS — J40 Bronchitis, not specified as acute or chronic: Secondary | ICD-10-CM | POA: Diagnosis not present

## 2017-07-19 DIAGNOSIS — R062 Wheezing: Secondary | ICD-10-CM | POA: Insufficient documentation

## 2017-07-19 LAB — POCT GLYCOSYLATED HEMOGLOBIN (HGB A1C)
ESTIMATED AVERAGE GLUCOSE: 123
HEMOGLOBIN A1C: 5.9

## 2017-07-19 LAB — TSH: TSH: 0.75 mIU/L (ref 0.40–4.50)

## 2017-07-19 LAB — BASIC METABOLIC PANEL WITH GFR
BUN: 11 mg/dL (ref 7–25)
CHLORIDE: 91 mmol/L — AB (ref 98–110)
CO2: 29 mmol/L (ref 20–32)
Calcium: 9.3 mg/dL (ref 8.6–10.4)
Creat: 0.63 mg/dL (ref 0.60–0.93)
GFR, EST AFRICAN AMERICAN: 105 mL/min/{1.73_m2} (ref >=60)
GFR, Est Non African American: 90 mL/min/{1.73_m2} (ref >=60)
Glucose, Bld: 114 mg/dL — ABNORMAL HIGH (ref 65–99)
POTASSIUM: 3.5 mmol/L (ref 3.5–5.3)
Sodium: 130 mmol/L — ABNORMAL LOW (ref 135–146)

## 2017-07-19 LAB — VITAMIN B12: Vitamin B-12: >2000 pg/mL — ABNORMAL HIGH (ref 200–1100)

## 2017-07-19 LAB — CBC WITH DIFFERENTIAL/PLATELET
BASOS ABS: 85 {cells}/uL (ref 0–200)
Basophils Relative: 0.7 %
EOS PCT: 1.1 %
Eosinophils Absolute: 133 cells/uL (ref 15–500)
HEMATOCRIT: 38 % (ref 35.0–45.0)
HEMOGLOBIN: 12.7 g/dL (ref 11.7–15.5)
LYMPHS ABS: 1416 {cells}/uL (ref 850–3900)
MCH: 25 pg — ABNORMAL LOW (ref 27.0–33.0)
MCHC: 33.4 g/dL (ref 32.0–36.0)
MCV: 75 fL — ABNORMAL LOW (ref 80.0–100.0)
MPV: 9.8 fL (ref 7.5–12.5)
Monocytes Relative: 8.2 %
NEUTROS ABS: 9474 {cells}/uL — AB (ref 1500–7800)
Neutrophils Relative %: 78.3 %
Platelets: 300 10*3/uL (ref 140–400)
RBC: 5.07 10*6/uL (ref 3.80–5.10)
RDW: 13.7 % (ref 11.0–15.0)
Total Lymphocyte: 11.7 %
WBC: 12.1 10*3/uL — AB (ref 3.8–10.8)
WBCMIX: 992 {cells}/uL — AB (ref 200–950)

## 2017-07-19 MED ORDER — ALBUTEROL SULFATE HFA 108 (90 BASE) MCG/ACT IN AERS: 2.0000 | INHALATION_SPRAY | Freq: Four times a day (QID) | RESPIRATORY_TRACT | 0 refills | Status: DC | PRN

## 2017-07-19 MED ORDER — AZITHROMYCIN 250 MG PO TABS: ORAL_TABLET | ORAL | 0 refills | Status: DC

## 2017-07-19 MED ORDER — CYANOCOBALAMIN 1000 MCG/ML IJ SOLN
1000.0000 ug | Freq: Once | INTRAMUSCULAR | Status: AC
  Administered 2017-07-19: 1000 ug via INTRAMUSCULAR

## 2017-07-19 NOTE — Progress Notes (Signed)
Patient: Michaela Weber Female    DOB: September 08, 1946   71 y.o.   MRN: [id number] Visit Date: 07/19/2017  Today's Provider: Lavon Paganini, MD   Chief Complaint  Patient presents with  . Hypertension  . Hypothyroidism   Subjective:    HPI      Hypertension, follow-up:  BP Readings from Last 3 Encounters:  07/19/17 (!) 110/54  05/16/17 (!) 142/84  01/17/17 110/78    She was last seen for hypertension 6 months ago.  BP at that visit was 110/78. Management since that visit includes continuing medications. She reports excellent compliance with treatment. She is not having side effects.  She is exercising "randomly". She is not adherent to low salt diet.   Outside blood pressures are not being checked. She is experiencing none.  Patient denies chest pain, chest pressure/discomfort, claudication, dyspnea, exertional chest pressure/discomfort, fatigue, irregular heart beat, lower extremity edema, near-syncope, orthopnea, palpitations and syncope.   Cardiovascular risk factors include advanced age (older than 68 for men, 1 for women), dyslipidemia, hypertension and pre-diabetes.  Use of agents associated with hypertension: thyroid hormones.     Weight trend: decreasing due to illness Wt Readings from Last 3 Encounters:  07/19/17 146 lb (66.2 kg)  05/16/17 151 lb (68.5 kg)  01/17/17 152 lb (68.9 kg)    Current diet: in general, a "healthy" diet    ------------------------------------------------------------------------  Hypothyroid, follow-up:  TSH  Date Value Ref Range Status  01/18/2017 0.572 0.450 - 4.500 uIU/mL Final  07/18/2016 0.568 0.450 - 4.500 uIU/mL Final  04/19/2016 0.473 0.450 - 4.500 uIU/mL Final   Wt Readings from Last 3 Encounters:  07/19/17 146 lb (66.2 kg)  05/16/17 151 lb (68.5 kg)  01/17/17 152 lb (68.9 kg)    She was last seen for hypothyroid 6 months ago.  Management since that visit includes continuing medications. She reports  excellent compliance with treatment. She is not having side effects.  She is experiencing none She denies change in energy level, diarrhea, heat / cold intolerance, nervousness, palpitations and weight changes  ------------------------------------------------------------------------ Michaela Weber is also c/o bronchitis sx, suck as hoarse voice, dry cough, H/A. This has been present for 3 days. She is concerned because her husband has been diagnosed with bronchitis.   Denies SOB, CP, fever, chills.  Tried mucinex DM and tylenol that helped some.    [place] B12 deficiency: Getting B12 injections monthly in clinic.  Hasn't noticed a change in energy levels  Pre-diabetes: Trying to adhere to a low carb diet and exercise more.    Allergies  Allergen Reactions  . Codeine Nausea And Vomiting     Current Outpatient Prescriptions:  .  aspirin 81 MG EC tablet, Take 81 mg by mouth daily.  , Disp: , Rfl:  .  Cyanocobalamin (B-12 COMPLIANCE INJECTION) 1000 MCG/ML KIT, Inject 1 mL as directed every 30 (thirty) days., Disp: 1 kit, Rfl: 0 .  levothyroxine (SYNTHROID, LEVOTHROID) 125 MCG tablet, TAKE 1 TABLET(125 MCG) BY MOUTH DAILY, Disp: 90 tablet, Rfl: 4 .  lisinopril-hydrochlorothiazide (PRINZIDE,ZESTORETIC) 10-12.5 MG tablet, TAKE 1 TABLET BY MOUTH EVERY DAY, Disp: 90 tablet, Rfl: 4 .  VITAMIN D, CHOLECALCIFEROL, PO, Take 2,000 Units by mouth daily. , Disp: , Rfl:   Review of Systems  Constitutional: Positive for activity change and appetite change. Negative for chills, diaphoresis, fatigue, fever and unexpected weight change.  HENT: Positive for congestion and sore throat. Negative for ear pain and postnasal drip.   Respiratory: Positive  for cough and wheezing. Negative for chest tightness and shortness of breath.   Cardiovascular: Negative for chest pain, palpitations and leg swelling.  Gastrointestinal: Negative.   Endocrine: Negative for cold intolerance and heat intolerance.  Neurological: Negative.     Psychiatric/Behavioral: Negative.     Social History  Substance Use Topics  . Smoking status: Never Smoker  . Smokeless tobacco: Never Used     Comment: tobacco use - no  . Alcohol use No   Objective:   BP (!) 110/54 (BP Location: Left Arm, Patient Position: Sitting, Cuff Size: Normal)   Pulse 100   Temp 98 F (36.7 C) (Oral)   Resp 16   Wt 146 lb (66.2 kg)   BMI 26.70 kg/m  Vitals:   07/19/17 0818  BP: (!) 110/54  Pulse: 100  Resp: 16  Temp: 98 F (36.7 C)  TempSrc: Oral  Weight: 146 lb (66.2 kg)     Physical Exam  Constitutional: She is oriented to person, place, and time. She appears well-developed and well-nourished. No distress.  HENT:  Head: Normocephalic and atraumatic.  Right Ear: Tympanic membrane, external ear and ear canal normal.  Left Ear: Tympanic membrane, external ear and ear canal normal.  Nose: Nose normal. Right sinus exhibits no maxillary sinus tenderness and no frontal sinus tenderness. Left sinus exhibits no maxillary sinus tenderness and no frontal sinus tenderness.  Mouth/Throat: Uvula is midline and mucous membranes are normal. Posterior oropharyngeal erythema present. No oropharyngeal exudate.  Eyes: Pupils are equal, round, and reactive to light. Conjunctivae and EOM are normal.  Neck: Neck supple. No thyromegaly present.  Cardiovascular: Normal rate, regular rhythm and intact distal pulses.   Pulmonary/Chest: Effort normal. No respiratory distress. She has no decreased breath sounds. She has wheezes in the right upper field, the right lower field, the left upper field and the left lower field. She has rhonchi in the right lower field.  Abdominal: Normal appearance and bowel sounds are normal. There is no tenderness.  Musculoskeletal: She exhibits no edema.       Right shoulder: She exhibits no deformity.  Lymphadenopathy:    She has no cervical adenopathy.  Neurological: She is alert and oriented to person, place, and time.  Skin: Skin is  warm and dry. No rash noted.  Psychiatric: She has a normal mood and affect. Her behavior is normal.  Vitals reviewed.   Results for orders placed or performed in visit on 07/19/17  POCT glycosylated hemoglobin (Hb A1C)  Result Value Ref Range   Hemoglobin A1C 5.9    Est. average glucose Bld gHb Est-mCnc 123        Assessment & Plan:      Problem List Items Addressed This Visit      Cardiovascular and Mediastinum   Essential hypertension - Primary    Well-controlled Continue lisinopril HCTZ at current dose Check BMP Follow-up in 6 months      Relevant Orders   BASIC METABOLIC PANEL WITH GFR     Respiratory   Bronchitis    Diffuse wheezing on lung exam with rhonchi in RLL Given symptoms, is consistent with bronchitis This is likely viral in nature Check chest x-ray given focal findings on lung exam Discussed symptomatic treatment Albuterol as needed for wheezing Given prescription for azithromycin to start after 7 days of illness if not improving Return precautions discussed      Relevant Orders   DG Chest 2 View     Endocrine   Adult  hypothyroidism    Previously well controlled Recheck TSH Continue Synthroid at current dose Follow-up in 6 months      Relevant Orders   TSH     Other   Megaloblastic anemia due to B12 deficiency    Recheck B12 level Has been getting monthly B12 injections, which we can continue pending B12 level If B12 level is above normal range, consider cutting back to injections every 2-3 months      Relevant Medications   cyanocobalamin ((VITAMIN B-12)) injection 1,000 mcg (Completed)   Other Relevant Orders   B12   CBC w/Diff/Platelet   Pre-diabetes    Well-controlled with diet with A1c 5.9 Discussed diabetic diet and carb counting Follow-up in 6 months      Relevant Orders   POCT glycosylated hemoglobin (Hb A1C) (Completed)      Return in about 6 months (around 01/17/2018) for CPE.     The entirety of the information  documented in the History of Present Illness, Review of Systems and Physical Exam were personally obtained by me. Portions of this information were initially documented by Raquel Sarna Ratchford, CMA and reviewed by me for thoroughness and accuracy.    Lavon Paganini, MD  [facility]

## 2017-07-19 NOTE — Telephone Encounter (Signed)
-----   Message from Virginia Crews, MD sent at 07/19/2017 12:03 PM EDT ----- No pneumonia.  There is some suggestion on XRay that you may have COPD.  This is unlikely given that you have never smoked.  Plan as we discussed.  Can consider lung function tests in the future to rule this out.  Virginia Crews, MD, MPH [facility] 07/19/2017 12:03 PM

## 2017-07-19 NOTE — Assessment & Plan Note (Signed)
Previously well controlled Recheck TSH Continue Synthroid at current dose Follow-up in 6 months

## 2017-07-19 NOTE — Telephone Encounter (Signed)
Pt advised as below

## 2017-07-19 NOTE — Assessment & Plan Note (Signed)
Well-controlled with diet with A1c 5.9 Discussed diabetic diet and carb counting Follow-up in 6 months

## 2017-07-19 NOTE — Patient Instructions (Signed)

## 2017-07-19 NOTE — Assessment & Plan Note (Signed)
Recheck B12 level Has been getting monthly B12 injections, which we can continue pending B12 level If B12 level is above normal range, consider cutting back to injections every 2-3 months

## 2017-07-19 NOTE — Assessment & Plan Note (Signed)
Well-controlled Continue lisinopril HCTZ at current dose Check BMP Follow-up in 6 months

## 2017-07-19 NOTE — Assessment & Plan Note (Signed)
Diffuse wheezing on lung exam with rhonchi in RLL Given symptoms, is consistent with bronchitis This is likely viral in nature Check chest x-ray given focal findings on lung exam Discussed symptomatic treatment Albuterol as needed for wheezing Given prescription for azithromycin to start after 7 days of illness if not improving Return precautions discussed

## 2017-07-25 ENCOUNTER — Other Ambulatory Visit: Payer: Self-pay | Admitting: Family Medicine

## 2017-07-25 ENCOUNTER — Telehealth: Payer: Self-pay | Admitting: Family Medicine

## 2017-07-25 DIAGNOSIS — R059 Cough, unspecified: Secondary | ICD-10-CM

## 2017-07-25 DIAGNOSIS — R05 Cough: Secondary | ICD-10-CM

## 2017-07-25 MED ORDER — BENZONATATE 100 MG PO CAPS: ORAL_CAPSULE | ORAL | 0 refills | Status: DC

## 2017-07-25 NOTE — Telephone Encounter (Signed)
Pt agrees with treatment plan, but is requesting Tessalon Perles to be sent to [facility] in [place]. Would you agree to prescribe this?

## 2017-07-25 NOTE — Telephone Encounter (Signed)
Tessalon sent in.

## 2017-07-25 NOTE — Telephone Encounter (Signed)
Pt stated she saw Dr. Jacinto Reap on 07/19/17 and isn't feeling any better. Pt stated that she will finish the azithromycin (ZITHROMAX) 250 MG tablet tomorrow and she has been using the albuterol (PROVENTIL HFA;VENTOLIN HFA) 108 (90 Base) MCG/ACT inhaler twice a day. Pt stated that she is still coughing, has headache, & has no voice. Pt is requesting a nurse return her call to see if she should try something else. Since Dr. Jacinto Reap is out of the office this week can another provider please advise. Thanks TNP

## 2017-07-25 NOTE — Telephone Encounter (Signed)
Likely viral infection that will need to run its course. Cough may last another week. If still wheezing increase albuterol to 4 x day. Add an expectorant like Mucinex. Use Delsym for cough. If you have fever >100 will need to see you again.

## 2017-07-25 NOTE — Telephone Encounter (Signed)
Pt advised.

## 2017-07-25 NOTE — Telephone Encounter (Signed)
Please advise 

## 2017-07-25 NOTE — Telephone Encounter (Signed)
Can you please review 

## 2017-07-27 ENCOUNTER — Encounter: Payer: Self-pay | Admitting: Physician Assistant

## 2017-07-27 ENCOUNTER — Ambulatory Visit (INDEPENDENT_AMBULATORY_CARE_PROVIDER_SITE_OTHER): Payer: Medicare Other | Admitting: Physician Assistant

## 2017-07-27 VITALS — BP 140/80 | HR 80 | Temp 98.0°F | Resp 16 | Wt 145.2 lb

## 2017-07-27 DIAGNOSIS — R058 Other specified cough: Secondary | ICD-10-CM

## 2017-07-27 DIAGNOSIS — R05 Cough: Secondary | ICD-10-CM

## 2017-07-27 DIAGNOSIS — J40 Bronchitis, not specified as acute or chronic: Secondary | ICD-10-CM | POA: Diagnosis not present

## 2017-07-27 NOTE — Progress Notes (Signed)
Patient: Michaela Weber Female    DOB: 1945-11-25   71 y.o.   MRN: [id number] Visit Date: 07/27/2017  Today's Provider: Mar Daring, PA-C   Chief Complaint  Patient presents with  . URI   Subjective:    URI   This is a recurrent (she was seen last week and was diagnosed with Bronchitis) problem. The problem has been gradually worsening. There has been no fever. Associated symptoms include congestion, coughing and headaches. Pertinent negatives include no abdominal pain, chest pain, ear pain, plugged ear sensation, rhinorrhea, sinus pain, sneezing, sore throat or wheezing. She has tried inhaler use (antibiotic she finished yesterday.She is using Tessalon. ) for the symptoms.      Allergies  Allergen Reactions  . Codeine Nausea And Vomiting     Current Outpatient Prescriptions:  .  albuterol (PROVENTIL HFA;VENTOLIN HFA) 108 (90 Base) MCG/ACT inhaler, Inhale 2 puffs into the lungs every 6 (six) hours as needed for wheezing or shortness of breath., Disp: 1 Inhaler, Rfl: 0 .  aspirin 81 MG EC tablet, Take 81 mg by mouth daily.  , Disp: , Rfl:  .  benzonatate (TESSALON PERLES) 100 MG capsule, One or two every 8 hours as needed for cough, Disp: 30 capsule, Rfl: 0 .  Cyanocobalamin (B-12 COMPLIANCE INJECTION) 1000 MCG/ML KIT, Inject 1 mL as directed every 30 (thirty) days., Disp: 1 kit, Rfl: 0 .  levothyroxine (SYNTHROID, LEVOTHROID) 125 MCG tablet, TAKE 1 TABLET(125 MCG) BY MOUTH DAILY, Disp: 90 tablet, Rfl: 4 .  lisinopril-hydrochlorothiazide (PRINZIDE,ZESTORETIC) 10-12.5 MG tablet, TAKE 1 TABLET BY MOUTH EVERY DAY, Disp: 90 tablet, Rfl: 4 .  VITAMIN D, CHOLECALCIFEROL, PO, Take 2,000 Units by mouth daily. , Disp: , Rfl:  .  azithromycin (ZITHROMAX) 250 MG tablet, Take 2 tabs day 1 and then 1 tab daily for days 2-5 (Patient not taking: Reported on 07/27/2017), Disp: 6 each, Rfl: 0  Review of Systems  Constitutional: Positive for chills and fatigue. Negative for fever.    HENT: Positive for congestion, postnasal drip and voice change. Negative for ear pain, nosebleeds, rhinorrhea, sinus pain, sinus pressure, sneezing, sore throat and trouble swallowing.   Respiratory: Positive for cough and chest tightness. Negative for shortness of breath and wheezing.   Cardiovascular: Negative for chest pain, palpitations and leg swelling.  Gastrointestinal: Negative for abdominal pain.  Musculoskeletal: Positive for back pain (upper back hurts from coughing).  Neurological: Positive for dizziness (only when she uses the inhaler) and headaches. Negative for light-headedness.    Social History  Substance Use Topics  . Smoking status: Never Smoker  . Smokeless tobacco: Never Used     Comment: tobacco use - no  . Alcohol use No   Objective:   BP 140/80 (BP Location: Left Arm, Patient Position: Sitting, Cuff Size: Normal)   Pulse 80   Temp 98 F (36.7 C) (Oral)   Resp 16   Wt 145 lb 3.2 oz (65.9 kg)   SpO2 98%   BMI 26.56 kg/m    Physical Exam  Constitutional: She appears well-developed and well-nourished. No distress.  HENT:  Head: Normocephalic and atraumatic.  Right Ear: Hearing, tympanic membrane, external ear and ear canal normal.  Left Ear: Hearing, tympanic membrane, external ear and ear canal normal.  Nose: Nose normal.  Mouth/Throat: Uvula is midline, oropharynx is clear and moist and mucous membranes are normal. No oropharyngeal exudate.  Eyes: Pupils are equal, round, and reactive to light. Conjunctivae are normal.  Right eye exhibits no discharge. Left eye exhibits no discharge. No scleral icterus.  Neck: Normal range of motion. Neck supple. No tracheal deviation present. No thyromegaly present.  Cardiovascular: Normal rate, regular rhythm and normal heart sounds.  Exam reveals no gallop and no friction rub.   No murmur heard. Pulmonary/Chest: Effort normal and breath sounds normal. No stridor. No respiratory distress. She has no wheezes. She has no  rales.  Lymphadenopathy:    She has no cervical adenopathy.  Skin: Skin is warm and dry. She is not diaphoretic.  Vitals reviewed.       Assessment & Plan:     1. Post-viral cough syndrome Breath sounds are now clear but patient continues to have cough and chest tightness. Discussed post viral cough syndrome, especially after bronchitis and that it may take 3-4 weeks for cough to fully resolve due to inflammation. Discussed using inhaler on prn basis now when she feels tight with SOB or during coughing spell, since she is having lightheadedness following inhaler usage. Also discussed making sure to rinse out mouth to prevent thrush. Advised of symptoms and she is to call if they develop. Discussed voice rest and salt water gargles for laryngitis. Tylenol prn for headache. Continue tessalon perles for cough prn. Call if symptoms worsen or fail to improve.   2. Bronchitis See above medical treatment plan.       Mar Daring, PA-C  [facility]

## 2017-07-27 NOTE — Patient Instructions (Signed)
Cough, Adult Coughing is a reflex that clears your throat and your airways. Coughing helps to heal and protect your lungs. It is normal to cough occasionally, but a cough that happens with other symptoms or lasts a long time may be a sign of a condition that needs treatment. A cough may last only 2-3 weeks (acute), or it may last longer than 8 weeks (chronic). What are the causes? Coughing is commonly caused by:  Breathing in substances that irritate your lungs.  A viral or bacterial respiratory infection.  Allergies.  Asthma.  Postnasal drip.  Smoking.  Acid backing up from the stomach into the esophagus (gastroesophageal reflux).  Certain medicines.  Chronic lung problems, including COPD (or rarely, lung cancer).  Other medical conditions such as heart failure.  Follow these instructions at home: Pay attention to any changes in your symptoms. Take these actions to help with your discomfort:  Take medicines only as told by your health care provider. ? If you were prescribed an antibiotic medicine, take it as told by your health care provider. Do not stop taking the antibiotic even if you start to feel better. ? Talk with your health care provider before you take a cough suppressant medicine.  Drink enough fluid to keep your urine clear or pale yellow.  If the air is dry, use a cold steam vaporizer or humidifier in your bedroom or your home to help loosen secretions.  Avoid anything that causes you to cough at work or at home.  If your cough is worse at night, try sleeping in a semi-upright position.  Avoid cigarette smoke. If you smoke, quit smoking. If you need help quitting, ask your health care provider.  Avoid caffeine.  Avoid alcohol.  Rest as needed.  Contact a health care provider if:  You have new symptoms.  You cough up pus.  Your cough does not get better after 2-3 weeks, or your cough gets worse.  You cannot control your cough with suppressant  medicines and you are losing sleep.  You develop pain that is getting worse or pain that is not controlled with pain medicines.  You have a fever.  You have unexplained weight loss.  You have night sweats. Get help right away if:  You cough up blood.  You have difficulty breathing.  Your heartbeat is very fast. This information is not intended to replace advice given to you by your health care provider. Make sure you discuss any questions you have with your health care provider. Document Released: 03/10/2011 Document Revised: 02/17/2016 Document Reviewed: 11/18/2014 Elsevier Interactive Patient Education  2017 [employer]  

## 2017-08-10 ENCOUNTER — Other Ambulatory Visit: Payer: Self-pay | Admitting: Family Medicine

## 2017-08-17 ENCOUNTER — Other Ambulatory Visit: Payer: Self-pay | Admitting: Family Medicine

## 2017-08-17 DIAGNOSIS — E039 Hypothyroidism, unspecified: Secondary | ICD-10-CM

## 2017-10-24 ENCOUNTER — Ambulatory Visit (INDEPENDENT_AMBULATORY_CARE_PROVIDER_SITE_OTHER): Payer: Medicare Other

## 2017-10-24 DIAGNOSIS — D531 Other megaloblastic anemias, not elsewhere classified: Secondary | ICD-10-CM

## 2017-10-24 MED ORDER — CYANOCOBALAMIN 1000 MCG/ML IJ SOLN
1000.0000 ug | Freq: Once | INTRAMUSCULAR | Status: AC
  Administered 2017-10-24: 1000 ug via INTRAMUSCULAR

## 2018-01-15 ENCOUNTER — Ambulatory Visit (INDEPENDENT_AMBULATORY_CARE_PROVIDER_SITE_OTHER): Payer: Medicare Other

## 2018-01-15 ENCOUNTER — Other Ambulatory Visit: Payer: Self-pay | Admitting: Family Medicine

## 2018-01-15 VITALS — BP 128/64 | HR 72 | Temp 97.6°F | Ht 62.0 in | Wt 140.0 lb

## 2018-01-15 DIAGNOSIS — I10 Essential (primary) hypertension: Secondary | ICD-10-CM

## 2018-01-15 DIAGNOSIS — Z0000 Encounter for general adult medical examination without abnormal findings: Secondary | ICD-10-CM

## 2018-01-15 NOTE — Patient Instructions (Signed)
Michaela Weber , Thank you for taking time to come for your Medicare Wellness Visit. I appreciate your ongoing commitment to your health goals. Please review the following plan we discussed and let me know if I can assist you in the future.   Screening recommendations/referrals: Colonoscopy: Up to date Mammogram: Up to date Bone Density: Up to date Recommended yearly ophthalmology/optometry visit for glaucoma screening and checkup Recommended yearly dental visit for hygiene and checkup  Vaccinations: Influenza vaccine: N/A Pneumococcal vaccine: Up to date Tdap vaccine: Pt declines today.  Shingles vaccine: Pt declines today.     Advanced directives: Advance directive discussed with you today. I have provided a copy for you to complete at home and have notarized. Once this is complete please bring a copy in to our office so we can scan it into your chart.  Conditions/risks identified: Recommend increasing water intake to 4-6 glasses a day.   Next appointment: 01/17/18 @ 9 AM with Dr Brita Romp.   Preventive Care 32 Years and Older, Female Preventive care refers to lifestyle choices and visits with your health care provider that can promote health and wellness. What does preventive care include?  A yearly physical exam. This is also called an annual well check.  Dental exams once or twice a year.  Routine eye exams. Ask your health care provider how often you should have your eyes checked.  Personal lifestyle choices, including:  Daily care of your teeth and gums.  Regular physical activity.  Eating a healthy diet.  Avoiding tobacco and drug use.  Limiting alcohol use.  Practicing safe sex.  Taking low-dose aspirin every day.  Taking vitamin and mineral supplements as recommended by your health care provider. What happens during an annual well check? The services and screenings done by your health care provider during your annual well check will depend on your age, overall  health, lifestyle risk factors, and family history of disease. Counseling  Your health care provider may ask you questions about your:  Alcohol use.  Tobacco use.  Drug use.  Emotional well-being.  Home and relationship well-being.  Sexual activity.  Eating habits.  History of falls.  Memory and ability to understand (cognition).  Work and work Statistician.  Reproductive health. Screening  You may have the following tests or measurements:  Height, weight, and BMI.  Blood pressure.  Lipid and cholesterol levels. These may be checked every 5 years, or more frequently if you are over 68 years old.  Skin check.  Lung cancer screening. You may have this screening every year starting at age 36 if you have a 30-pack-year history of smoking and currently smoke or have quit within the past 15 years.  Fecal occult blood test (FOBT) of the stool. You may have this test every year starting at age 70.  Flexible sigmoidoscopy or colonoscopy. You may have a sigmoidoscopy every 5 years or a colonoscopy every 10 years starting at age 2.  Hepatitis C blood test.  Hepatitis B blood test.  Sexually transmitted disease (STD) testing.  Diabetes screening. This is done by checking your blood sugar (glucose) after you have not eaten for a while (fasting). You may have this done every 1-3 years.  Bone density scan. This is done to screen for osteoporosis. You may have this done starting at age 80.  Mammogram. This may be done every 1-2 years. Talk to your health care provider about how often you should have regular mammograms. Talk with your health care provider about  your test results, treatment options, and if necessary, the need for more tests. Vaccines  Your health care provider may recommend certain vaccines, such as:  Influenza vaccine. This is recommended every year.  Tetanus, diphtheria, and acellular pertussis (Tdap, Td) vaccine. You may need a Td booster every 10  years.  Zoster vaccine. You may need this after age 46.  Pneumococcal 13-valent conjugate (PCV13) vaccine. One dose is recommended after age 79.  Pneumococcal polysaccharide (PPSV23) vaccine. One dose is recommended after age 52. Talk to your health care provider about which screenings and vaccines you need and how often you need them. This information is not intended to replace advice given to you by your health care provider. Make sure you discuss any questions you have with your health care provider. Document Released: 10/08/2015 Document Revised: 05/31/2016 Document Reviewed: 07/13/2015 Elsevier Interactive Patient Education  2017 [place] Prevention in the Home Falls can cause injuries. They can happen to people of all ages. There are many things you can do to make your home safe and to help prevent falls. What can I do on the outside of my home?  Regularly fix the edges of walkways and driveways and fix any cracks.  Remove anything that might make you trip as you walk through a door, such as a raised step or threshold.  Trim any bushes or trees on the path to your home.  Use bright outdoor lighting.  Clear any walking paths of anything that might make someone trip, such as rocks or tools.  Regularly check to see if handrails are loose or broken. Make sure that both sides of any steps have handrails.  Any raised decks and porches should have guardrails on the edges.  Have any leaves, snow, or ice cleared regularly.  Use sand or salt on walking paths during winter.  Clean up any spills in your garage right away. This includes oil or grease spills. What can I do in the bathroom?  Use night lights.  Install grab bars by the toilet and in the tub and shower. Do not use towel bars as grab bars.  Use non-skid mats or decals in the tub or shower.  If you need to sit down in the shower, use a plastic, non-slip stool.  Keep the floor dry. Clean up any water that  spills on the floor as soon as it happens.  Remove soap buildup in the tub or shower regularly.  Attach bath mats securely with double-sided non-slip rug tape.  Do not have throw rugs and other things on the floor that can make you trip. What can I do in the bedroom?  Use night lights.  Make sure that you have a light by your bed that is easy to reach.  Do not use any sheets or blankets that are too big for your bed. They should not hang down onto the floor.  Have a firm chair that has side arms. You can use this for support while you get dressed.  Do not have throw rugs and other things on the floor that can make you trip. What can I do in the kitchen?  Clean up any spills right away.  Avoid walking on wet floors.  Keep items that you use a lot in easy-to-reach places.  If you need to reach something above you, use a strong step stool that has a grab bar.  Keep electrical cords out of the way.  Do not use floor polish or wax  that makes floors slippery. If you must use wax, use non-skid floor wax.  Do not have throw rugs and other things on the floor that can make you trip. What can I do with my stairs?  Do not leave any items on the stairs.  Make sure that there are handrails on both sides of the stairs and use them. Fix handrails that are broken or loose. Make sure that handrails are as long as the stairways.  Check any carpeting to make sure that it is firmly attached to the stairs. Fix any carpet that is loose or worn.  Avoid having throw rugs at the top or bottom of the stairs. If you do have throw rugs, attach them to the floor with carpet tape.  Make sure that you have a light switch at the top of the stairs and the bottom of the stairs. If you do not have them, ask someone to add them for you. What else can I do to help prevent falls?  Wear shoes that:  Do not have high heels.  Have rubber bottoms.  Are comfortable and fit you well.  Are closed at the  toe. Do not wear sandals.  If you use a stepladder:  Make sure that it is fully opened. Do not climb a closed stepladder.  Make sure that both sides of the stepladder are locked into place.  Ask someone to hold it for you, if possible.  Clearly mark and make sure that you can see:  Any grab bars or handrails.  First and last steps.  Where the edge of each step is.  Use tools that help you move around (mobility aids) if they are needed. These include:  Canes.  Walkers.  Scooters.  Crutches.  Turn on the lights when you go into a dark area. Replace any light bulbs as soon as they burn out.  Set up your furniture so you have a clear path. Avoid moving your furniture around.  If any of your floors are uneven, fix them.  If there are any pets around you, be aware of where they are.  Review your medicines with your doctor. Some medicines can make you feel dizzy. This can increase your chance of falling. Ask your doctor what other things that you can do to help prevent falls. This information is not intended to replace advice given to you by your health care provider. Make sure you discuss any questions you have with your health care provider. Document Released: 07/08/2009 Document Revised: 02/17/2016 Document Reviewed: 10/16/2014 Elsevier Interactive Patient Education  2017 [employer].

## 2018-01-15 NOTE — Progress Notes (Signed)
Subjective:   Michaela Weber is a 72 y.o. female who presents for Medicare Annual (Subsequent) preventive examination.  Review of Systems:  N/A  Cardiac Risk Factors include: advanced age (>84mn, >>77women);hypertension     Objective:     Vitals: BP 128/64 (BP Location: Left Arm)   Pulse 72   Temp 97.6 F (36.4 C) (Oral)   Ht 5' 2" (1.575 m)   Wt 140 lb (63.5 kg)   BMI 25.61 kg/m   Body mass index is 25.61 kg/m.  Advanced Directives 01/15/2018 07/27/2017 05/16/2017 04/07/2016 02/16/2016  Does Patient Have a Medical Advance Directive? _0   Would patient like information on creating a medical advance directive? Yes (MAU/Ambulatory/Procedural Areas - Information given) - Yes (ED - Information included in AVS) No - patient declined information -    Tobacco Social History   Tobacco Use  Smoking Status Never Smoker  Smokeless Tobacco Never Used  Tobacco Comment   tobacco use - no     Counseling given: Not Answered Comment: tobacco use - no   Clinical Intake:  Pre-visit preparation completed: Yes  Pain : No/denies pain Pain Score: 0-No pain     Nutritional Status: BMI 25 -29 Overweight Nutritional Risks: None Diabetes: No  How often do you need to have someone help you when you read instructions, pamphlets, or other written materials from your doctor or pharmacy?: 1 - Never  Interpreter Needed?: No  Information [facility] :: [facility]  Past Medical History:  Diagnosis Date  . Anemia    in past  . Diastolic dysfunction   . Valvular disease    mild  . Wears contact lenses    Past Surgical History:  Procedure Laterality Date  . CESAREAN SECTION     x2   . COLONOSCOPY  approx 2007  . COLONOSCOPY WITH PROPOFOL N/A 04/07/2016   Procedure: COLONOSCOPY WITH PROPOFOL;  Surgeon: DLucilla Lame MD;  [facility]  Service: Endoscopy;  Laterality: N/A;  . POLYPECTOMY  04/07/2016   Procedure: POLYPECTOMY;  Surgeon: DLucilla Lame MD;   [place] [facility]  Service: Endoscopy;;  . TONSILLECTOMY    . TUBAL LIGATION     with 2nd c-sect   Family History  Problem Relation Age of Onset  . Diabetes Father   . Mental retardation Mother    Social History   Socioeconomic History  . Marital status: Married    Spouse name: Not on file  . Number of children: 2  . Years of education: Not on file  . Highest education level: Associate degree: occupational, tHotel manager or vocational program  Occupational History  . Not on file  Social Needs  . Financial resource strain: Not hard at all  . Food insecurity:    Worry: Never true    Inability: Never true  . Transportation needs:    Medical: No    Non-medical: No  Tobacco Use  . Smoking status: Never Smoker  . Smokeless tobacco: Never Used  . Tobacco comment: tobacco use - no  Substance and Sexual Activity  . Alcohol use: No  . Drug use: No  . Sexual activity: Not on file  Lifestyle  . Physical activity:    Days per week: Not on file    Minutes per session: Not on file  . Stress: Only a little  Relationships  . Social connections:    Talks on phone: Not on file    Gets together: Not on file  Attends religious service: Not on file    Active member of club or organization: Not on file    Attends meetings of clubs or organizations: Not on file    Relationship status: Not on file  Other Topics Concern  . Not on file  Social History Narrative   Married; retired; regular exercise 2-3 x weekly aerobics     Outpatient Encounter Medications as of 01/15/2018  Medication Sig  . aspirin 81 MG EC tablet Take 81 mg by mouth daily.    . Cyanocobalamin (B-12 COMPLIANCE INJECTION) 1000 MCG/ML KIT Inject 1 mL as directed every 30 (thirty) days. (Patient taking differently: Inject 1 mL as directed. )  . levothyroxine (SYNTHROID, LEVOTHROID) 125 MCG tablet TAKE 1 TABLET(125 MCG) BY MOUTH DAILY  . lisinopril-hydrochlorothiazide (PRINZIDE,ZESTORETIC) 10-12.5 MG tablet  TAKE 1 TABLET BY MOUTH EVERY DAY  . VITAMIN D, CHOLECALCIFEROL, PO Take 2,000 Units by mouth daily.   . benzonatate (TESSALON PERLES) 100 MG capsule One or two every 8 hours as needed for cough (Patient not taking: Reported on 01/15/2018)  . PROAIR HFA 108 (90 Base) MCG/ACT inhaler INHALE 2 PUFFS INTO THE LUNGS EVERY 6 HOURS AS NEEDED FOR WHEEZING OR SHORTNESS OF BREATH (Patient not taking: Reported on 01/15/2018)   No facility-administered encounter medications on file as of 01/15/2018.     Activities of Daily Living In your present state of health, do you have any difficulty performing the following activities: 01/15/2018 05/16/2017  Hearing? N N  Vision? N N  Difficulty concentrating or making decisions? Y N  Walking or climbing stairs? N N  Dressing or bathing? N N  Doing errands, shopping? N N  Preparing Food and eating ? N N  Using the Toilet? N N  In the past six months, have you accidently leaked urine? N Y  Comment - occasionally  Do you have problems with loss of bowel control? N N  Managing your Medications? N N  Managing your Finances? N N  Housekeeping or managing your Housekeeping? N N  Some recent data might be hidden    Patient Care Team: Virginia Crews, MD as PCP - General (Family Medicine)    Assessment:   This is a routine wellness examination for Michaela Weber.  Exercise Activities and Dietary recommendations Current Exercise Habits: The patient does not participate in regular exercise at present, Exercise limited by: None identified  Goals    . DIET - INCREASE WATER INTAKE     Recommend increasing water intake to 4-6 glasses a day.        Fall Risk Fall Risk  01/15/2018 05/16/2017 02/16/2016  Falls in the past year? No No No   Is the patient's home free of loose throw rugs in walkways, pet beds, electrical cords, etc?   yes      Grab bars in the bathroom? yes      Handrails on the stairs?   No       Adequate lighting?   yes  Timed Get Up and Go  performed: N/A  Depression Screen PHQ 2/9 Scores 01/15/2018 05/16/2017 05/16/2017 02/16/2016  PHQ - 2 Score 0 0 0 0  PHQ- 9 Score - 0 - -     Cognitive Function: Pt declined screening today.      6CIT Screen 05/16/2017  What Year? 0 points  What month? 0 points  What time? 0 points  Count back from 20 0 points  Months in reverse 0 points  Repeat phrase 0 points  Total Score 0    Immunization History  Administered Date(s) Administered  . Influenza, High Dose Seasonal PF 07/18/2016  . Influenza-Unspecified 06/22/2014  . Pneumococcal Conjugate-13 06/22/2014  . Pneumococcal Polysaccharide-23 11/24/2011    Qualifies for Shingles Vaccine? Due for Shingles vaccine. Declined my offer to administer today. Education has been provided regarding the importance of this vaccine. Pt has been advised to call her insurance company to determine her out of pocket expense. Advised she may also receive this vaccine at her local pharmacy or Health Dept. Verbalized acceptance and understanding.  Screening Tests Health Maintenance  Topic Date Due  . TETANUS/TDAP  09/25/2026 (Originally 03/20/1965)  . INFLUENZA VACCINE  04/25/2018  . MAMMOGRAM  03/24/2019  . COLONOSCOPY  04/08/2019  . DEXA SCAN  Completed  . Hepatitis C Screening  Completed  . PNA vac Low Risk Adult  Completed    Cancer Screenings: Lung: Low Dose CT Chest recommended if Age 70-80 years, 30 pack-year currently smoking OR have quit w/in 15years. Patient does not qualify. Breast:  Up to date on Mammogram? Yes   Up to date of Bone Density/Dexa? Yes Colorectal: Up to date  Additional Screenings:  Hepatitis C Screening: Up to date     Plan:  I have personally reviewed and addressed the Medicare Annual Wellness questionnaire and have noted the following in the patient's chart:  A. Medical and social history B. Use of alcohol, tobacco or illicit drugs  C. Current medications and supplements D. Functional ability and status E.    Nutritional status F.  Physical activity G. Advance directives H. List of other physicians I.  Hospitalizations, surgeries, and ER visits in previous 12 months J.  [place] such as hearing and vision if needed, cognitive and depression L. Referrals and appointments - none  In addition, I have reviewed and discussed with patient certain preventive protocols, quality metrics, and best practice recommendations. A written personalized care plan for preventive services as well as general preventive health recommendations were provided to patient.  See attached scanned questionnaire for additional information.   Signed,  Fabio Neighbors, LPN Nurse Health Advisor   Nurse Recommendations: Pt declined the tetanus vaccine.

## 2018-01-17 ENCOUNTER — Encounter: Payer: Self-pay | Admitting: Family Medicine

## 2018-01-17 ENCOUNTER — Ambulatory Visit (INDEPENDENT_AMBULATORY_CARE_PROVIDER_SITE_OTHER): Payer: Medicare Other | Admitting: Family Medicine

## 2018-01-17 VITALS — BP 116/72 | HR 79 | Temp 97.7°F | Resp 16 | Ht 62.0 in | Wt 140.0 lb

## 2018-01-17 DIAGNOSIS — Z780 Asymptomatic menopausal state: Secondary | ICD-10-CM | POA: Diagnosis not present

## 2018-01-17 DIAGNOSIS — E039 Hypothyroidism, unspecified: Secondary | ICD-10-CM | POA: Diagnosis not present

## 2018-01-17 DIAGNOSIS — E7800 Pure hypercholesterolemia, unspecified: Secondary | ICD-10-CM | POA: Diagnosis not present

## 2018-01-17 DIAGNOSIS — Z0000 Encounter for general adult medical examination without abnormal findings: Secondary | ICD-10-CM

## 2018-01-17 DIAGNOSIS — R7303 Prediabetes: Secondary | ICD-10-CM | POA: Diagnosis not present

## 2018-01-17 DIAGNOSIS — I10 Essential (primary) hypertension: Secondary | ICD-10-CM | POA: Diagnosis not present

## 2018-01-17 DIAGNOSIS — M8580 Other specified disorders of bone density and structure, unspecified site: Secondary | ICD-10-CM

## 2018-01-17 NOTE — Assessment & Plan Note (Signed)
Continue Ca and Vit D supplementation Recheck DEXA in 02/2018 as it has been 2 yrs since last screening Patient would consider osteoporosis treatment if bone density has worsened

## 2018-01-17 NOTE — Assessment & Plan Note (Signed)
Diet controlled Last A1c 5.9 Discussed low carb diet F/u in 6 months

## 2018-01-17 NOTE — Assessment & Plan Note (Signed)
Recheck lipid panel 

## 2018-01-17 NOTE — Progress Notes (Signed)
Patient: Michaela Weber, Female    DOB: December 27, 1945, 72 y.o.   MRN: [id number] Visit Date: 01/17/2018  Today's Provider: Lavon Paganini, MD   Chief Complaint  Patient presents with  . Annual Exam   Subjective:     Complete Physical Michaela Weber is a 72 y.o. female. She feels well. She reports exercising rarely. She reports she is sleeping fairly well. She states she is stressed because her husband is hospitalized x 10 days due diabetes complications.  Pt had Annual Medicare Wellness Exam on 01/15/2018 Last colonoscopy- 04/07/2016- tubular adenoma. Repeat 5 years. Last BMD- 03/08/2016- osteopenia. -----------------------------------------------------------   Review of Systems  Constitutional: Negative.   HENT: Negative.   Eyes: Negative for photophobia, pain, discharge, redness, itching and visual disturbance.       Dry eyes  Respiratory: Negative.   Cardiovascular: Negative.   Gastrointestinal: Negative.   Endocrine: Negative.   Genitourinary: Negative.   Musculoskeletal: Negative.   Skin: Negative.   Allergic/Immunologic: Negative.   Neurological: Negative.   Hematological: Negative.   Psychiatric/Behavioral: Negative.     Social History   Socioeconomic History  . Marital status: Married    Spouse name: Zoii Florer  . Number of children: 2  . Years of education: 41  . Highest education level: Associate degree: occupational, Hotel manager, or vocational program  Occupational History    Employer: RETIRED  Social Needs  . Financial resource strain: Not hard at all  . Food insecurity:    Worry: Never true    Inability: Never true  . Transportation needs:    Medical: No    Non-medical: No  Tobacco Use  . Smoking status: Never Smoker  . Smokeless tobacco: Never Used  . Tobacco comment: tobacco use - no  Substance and Sexual Activity  . Alcohol use: No  . Drug use: No  . Sexual activity: Not on file  Lifestyle  . Physical activity:    Days per week:  Not on file    Minutes per session: Not on file  . Stress: Only a little  Relationships  . Social connections:    Talks on phone: Not on file    Gets together: Not on file    Attends religious service: Not on file    Active member of club or organization: Not on file    Attends meetings of clubs or organizations: Not on file    Relationship status: Not on file  . Intimate partner violence:    Fear of current or ex partner: Not on file    Emotionally abused: Not on file    Physically abused: Not on file    Forced sexual activity: Not on file  Other Topics Concern  . Not on file  Social History Narrative  . Not on file    Past Medical History:  Diagnosis Date  . Anemia    in past  . Diastolic dysfunction   . Valvular disease    mild  . Wears contact lenses      Patient Active Problem List   Diagnosis Date Noted  . Bronchitis 07/19/2017  . History of adenomatous polyp of colon   . Allergic rhinitis 02/16/2016  . CD (contact dermatitis) 02/16/2016  . Dyshidrotic eczema 02/16/2016  . Fatigue 02/16/2016  . Acid reflux 02/16/2016  . Pre-diabetes 02/16/2016  . Vitamin D deficiency 07/14/2015  . Essential hypertension 07/14/2015  . HLD (hyperlipidemia) 07/14/2015  . MURMUR 06/16/2010  . Disorder of mitral valve 05/15/2007  .  Megaloblastic anemia due to B12 deficiency 04/19/2006  . Adult hypothyroidism 02/06/2006    Past Surgical History:  Procedure Laterality Date  . CESAREAN SECTION     x2   . COLONOSCOPY  approx 2007  . COLONOSCOPY WITH PROPOFOL N/A 04/07/2016   Procedure: COLONOSCOPY WITH PROPOFOL;  Surgeon: Lucilla Lame, MD;  Location: [facility];  Service: Endoscopy;  Laterality: N/A;  . GANGLION CYST EXCISION    . POLYPECTOMY  04/07/2016   Procedure: POLYPECTOMY;  Surgeon: Lucilla Lame, MD;  Location: [facility];  Service: Endoscopy;;  . TONSILLECTOMY    . TUBAL LIGATION     with 2nd c-sect    Her family history includes Alzheimer's disease  in her mother; Diabetes in her father; Healthy in her brother. There is no history of Breast cancer, Colon cancer, or Ovarian cancer.      Current Outpatient Medications:  .  aspirin 81 MG EC tablet, Take 81 mg by mouth daily.  , Disp: , Rfl:  .  Cyanocobalamin (B-12 COMPLIANCE INJECTION) 1000 MCG/ML KIT, Inject 1 mL as directed every 30 (thirty) days. (Patient taking differently: Inject 1 mL as directed. ), Disp: 1 kit, Rfl: 0 .  levothyroxine (SYNTHROID, LEVOTHROID) 125 MCG tablet, TAKE 1 TABLET(125 MCG) BY MOUTH DAILY, Disp: 90 tablet, Rfl: 3 .  lisinopril-hydrochlorothiazide (PRINZIDE,ZESTORETIC) 10-12.5 MG tablet, TAKE 1 TABLET BY MOUTH EVERY DAY, Disp: 90 tablet, Rfl: 2 .  VITAMIN D, CHOLECALCIFEROL, PO, Take 2,000 Units by mouth daily. , Disp: , Rfl:   Patient Care Team: Virginia Crews, MD as PCP - General (Family Medicine)     Objective:   Vitals: BP 116/72 (BP Location: Left Arm, Patient Position: Sitting, Cuff Size: Normal)   Pulse 79   Temp 97.7 F (36.5 C) (Oral)   Resp 16   Ht 5' 2" (1.575 m)   Wt 140 lb (63.5 kg)   SpO2 97%   BMI 25.61 kg/m   Physical Exam  Constitutional: She is oriented to person, place, and time. She appears well-developed and well-nourished. No distress.  HENT:  Head: Normocephalic and atraumatic.  Right Ear: External ear normal.  Left Ear: External ear normal.  Mouth/Throat: Oropharynx is clear and moist.  Eyes: Pupils are equal, round, and reactive to light. Conjunctivae are normal. No scleral icterus.  Neck: Neck supple. No thyromegaly present.  Cardiovascular: Normal rate, regular rhythm, normal heart sounds and intact distal pulses.  No murmur heard. Pulmonary/Chest: Effort normal and breath sounds normal. No respiratory distress. She has no wheezes. She has no rales.  Abdominal: Soft. Bowel sounds are normal. She exhibits no distension. There is no tenderness.  Musculoskeletal: She exhibits no edema or deformity.    Lymphadenopathy:    She has no cervical adenopathy.  Neurological: She is alert and oriented to person, place, and time.  Skin: Skin is warm. Capillary refill takes less than 2 seconds. No rash noted.  Psychiatric: She has a normal mood and affect. Her behavior is normal.  Vitals reviewed.   Activities of Daily Living In your present state of health, do you have any difficulty performing the following activities: 01/15/2018 05/16/2017  Hearing? N N  Vision? N N  Difficulty concentrating or making decisions? Y N  Walking or climbing stairs? N N  Dressing or bathing? N N  Doing errands, shopping? N N  Preparing Food and eating ? N N  Using the Toilet? N N  In the past six months, have you accidently leaked urine?  N Y  Comment - occasionally  Do you have problems with loss of bowel control? N N  Managing your Medications? N N  Managing your Finances? N N  Housekeeping or managing your Housekeeping? N N  Some recent data might be hidden    Fall Risk Assessment Fall Risk  01/15/2018 05/16/2017 02/16/2016  Falls in the past year? No No No     Depression Screen PHQ 2/9 Scores 01/15/2018 05/16/2017 05/16/2017 02/16/2016  PHQ - 2 Score 0 0 0 0  PHQ- 9 Score - 0 - -   Pt [facility].    Assessment & Plan:    Annual Physical Reviewed patient's Family Medical History Reviewed and updated list of patient's medical providers Assessment of cognitive impairment was done Assessed patient's functional ability Established a written schedule for health screening [place] Completed and Reviewed  Exercise Activities and Dietary recommendations Goals    . DIET - INCREASE WATER INTAKE     Recommend increasing water intake to 4-6 glasses a day.        Immunization History  Administered Date(s) Administered  . Influenza, High Dose Seasonal PF 07/18/2016  . Influenza-Unspecified 06/22/2014  . Pneumococcal Conjugate-13 06/22/2014  . Pneumococcal  Polysaccharide-23 11/24/2011    Health Maintenance  Topic Date Due  . TETANUS/TDAP  09/25/2026 (Originally 03/20/1965)  . INFLUENZA VACCINE  04/25/2018  . MAMMOGRAM  03/24/2019  . COLONOSCOPY  04/08/2019  . DEXA SCAN  Completed  . Hepatitis C Screening  Completed  . PNA vac Low Risk Adult  Completed     Discussed health benefits of physical activity, and encouraged her to engage in regular exercise appropriate for her age and condition.    ------------------------------------------------------------------------------------------------------------ Problem List Items Addressed This Visit      Cardiovascular and Mediastinum   Essential hypertension    Well controlled Check CMP Continue lisinopril-HCTZ at current dose F/u in 6 months      Relevant Orders   Comprehensive metabolic panel     Endocrine   Adult hypothyroidism    Previously well controlled Continue synthroid at current dose Recheck A1c F/u in 6 months      Relevant Orders   TSH     Musculoskeletal and Integument   Osteopenia    Continue Ca and Vit D supplementation Recheck DEXA in 02/2018 as it has been 2 yrs since last screening Patient would consider osteoporosis treatment if bone density has worsened      Relevant Orders   DG Bone Density     Other   HLD (hyperlipidemia)    Recheck lipid panel      Relevant Orders   Lipid panel   Comprehensive metabolic panel   Pre-diabetes    Diet controlled Last A1c 5.9 Discussed low carb diet F/u in 6 months       Relevant Orders   Hemoglobin A1c    Other Visit Diagnoses    Encounter for annual physical exam    -  Primary   Relevant Orders   CBC w/Diff/Platelet   Postmenopausal       Relevant Orders   DG Bone Density       Return in about 6 months (around 07/19/2018) for chronic disease f/u.   The entirety of the information documented in the History of Present Illness, Review of Systems and Physical Exam were personally obtained by me.  Portions of this information were initially documented by Martha Clan, CMA and reviewed by me for thoroughness  and accuracy.    Virginia Crews, MD, MPH [facility] 01/17/2018 9:44 AM

## 2018-01-17 NOTE — Assessment & Plan Note (Signed)
Previously well controlled Continue synthroid at current dose Recheck A1c F/u in 6 months

## 2018-01-17 NOTE — Assessment & Plan Note (Signed)
Well controlled Check CMP Continue lisinopril-HCTZ at current dose F/u in 6 months

## 2018-01-17 NOTE — Patient Instructions (Signed)
Preventive Care 72 Years and Older, Female Preventive care refers to lifestyle choices and visits with your health care provider that can promote health and wellness. What does preventive care include?  A yearly physical exam. This is also called an annual well check.  Dental exams once or twice a year.  Routine eye exams. Ask your health care provider how often you should have your eyes checked.  Personal lifestyle choices, including: ? Daily care of your teeth and gums. ? Regular physical activity. ? Eating a healthy diet. ? Avoiding tobacco and drug use. ? Limiting alcohol use. ? Practicing safe sex. ? Taking low-dose aspirin every day. ? Taking vitamin and mineral supplements as recommended by your health care provider. What happens during an annual well check? The services and screenings done by your health care provider during your annual well check will depend on your age, overall health, lifestyle risk factors, and family history of disease. Counseling Your health care provider may ask you questions about your:  Alcohol use.  Tobacco use.  Drug use.  Emotional well-being.  Home and relationship well-being.  Sexual activity.  Eating habits.  History of falls.  Memory and ability to understand (cognition).  Work and work environment.  Reproductive health.  Screening You may have the following tests or measurements:  Height, weight, and BMI.  Blood pressure.  Lipid and cholesterol levels. These may be checked every 5 years, or more frequently if you are over 50 years old.  Skin check.  Lung cancer screening. You may have this screening every year starting at age 55 if you have a 30-pack-year history of smoking and currently smoke or have quit within the past 15 years.  Fecal occult blood test (FOBT) of the stool. You may have this test every year starting at age 50.  Flexible sigmoidoscopy or colonoscopy. You may have a sigmoidoscopy every 5 years or  a colonoscopy every 10 years starting at age 50.  Hepatitis C blood test.  Hepatitis B blood test.  Sexually transmitted disease (STD) testing.  Diabetes screening. This is done by checking your blood sugar (glucose) after you have not eaten for a while (fasting). You may have this done every 1-3 years.  Bone density scan. This is done to screen for osteoporosis. You may have this done starting at age 65.  Mammogram. This may be done every 1-2 years. Talk to your health care provider about how often you should have regular mammograms.  Talk with your health care provider about your test results, treatment options, and if necessary, the need for more tests. Vaccines Your health care provider may recommend certain vaccines, such as:  Influenza vaccine. This is recommended every year.  Tetanus, diphtheria, and acellular pertussis (Tdap, Td) vaccine. You may need a Td booster every 10 years.  Varicella vaccine. You may need this if you have not been vaccinated.  Zoster vaccine. You may need this after age 60.  Measles, mumps, and rubella (MMR) vaccine. You may need at least one dose of MMR if you were born in 1957 or later. You may also need a second dose.  Pneumococcal 13-valent conjugate (PCV13) vaccine. One dose is recommended after age 65.  Pneumococcal polysaccharide (PPSV23) vaccine. One dose is recommended after age 65.  Meningococcal vaccine. You may need this if you have certain conditions.  Hepatitis A vaccine. You may need this if you have certain conditions or if you travel or work in places where you may be exposed to hepatitis   A.  Hepatitis B vaccine. You may need this if you have certain conditions or if you travel or work in places where you may be exposed to hepatitis B.  Haemophilus influenzae type b (Hib) vaccine. You may need this if you have certain conditions.  Talk to your health care provider about which screenings and vaccines you need and how often you  need them. This information is not intended to replace advice given to you by your health care provider. Make sure you discuss any questions you have with your health care provider. Document Released: 10/08/2015 Document Revised: 05/31/2016 Document Reviewed: 07/13/2015 Elsevier Interactive Patient Education  2018 Elsevier Inc.  

## 2018-01-23 ENCOUNTER — Ambulatory Visit (INDEPENDENT_AMBULATORY_CARE_PROVIDER_SITE_OTHER): Payer: Medicare Other

## 2018-01-23 DIAGNOSIS — I10 Essential (primary) hypertension: Secondary | ICD-10-CM | POA: Diagnosis not present

## 2018-01-23 DIAGNOSIS — Z0000 Encounter for general adult medical examination without abnormal findings: Secondary | ICD-10-CM | POA: Diagnosis not present

## 2018-01-23 DIAGNOSIS — E538 Deficiency of other specified B group vitamins: Secondary | ICD-10-CM

## 2018-01-23 DIAGNOSIS — E7800 Pure hypercholesterolemia, unspecified: Secondary | ICD-10-CM | POA: Diagnosis not present

## 2018-01-23 DIAGNOSIS — E039 Hypothyroidism, unspecified: Secondary | ICD-10-CM | POA: Diagnosis not present

## 2018-01-23 DIAGNOSIS — R7303 Prediabetes: Secondary | ICD-10-CM | POA: Diagnosis not present

## 2018-01-23 MED ORDER — CYANOCOBALAMIN 1000 MCG/ML IJ SOLN
1000.0000 ug | Freq: Once | INTRAMUSCULAR | Status: AC
  Administered 2018-01-23: 1000 ug via INTRAMUSCULAR

## 2018-01-24 ENCOUNTER — Telehealth: Payer: Self-pay

## 2018-01-24 LAB — HEMOGLOBIN A1C
ESTIMATED AVERAGE GLUCOSE: 131 mg/dL
Hgb A1c MFr Bld: 6.2 % — ABNORMAL HIGH (ref 4.8–5.6)

## 2018-01-24 LAB — CBC WITH DIFFERENTIAL/PLATELET
BASOS ABS: 0.1 10*3/uL (ref 0.0–0.2)
Basos: 1 %
EOS (ABSOLUTE): 0.4 10*3/uL (ref 0.0–0.4)
Eos: 5 %
Hematocrit: 39 % (ref 34.0–46.6)
Hemoglobin: 12.8 g/dL (ref 11.1–15.9)
Immature Grans (Abs): 0 10*3/uL (ref 0.0–0.1)
Immature Granulocytes: 0 %
LYMPHS ABS: 2 10*3/uL (ref 0.7–3.1)
Lymphs: 26 %
MCH: 25.3 pg — AB (ref 26.6–33.0)
MCHC: 32.8 g/dL (ref 31.5–35.7)
MCV: 77 fL — ABNORMAL LOW (ref 79–97)
MONOS ABS: 0.4 10*3/uL (ref 0.1–0.9)
Monocytes: 5 %
NEUTROS ABS: 4.8 10*3/uL (ref 1.4–7.0)
Neutrophils: 63 %
PLATELETS: 296 10*3/uL (ref 150–379)
RBC: 5.05 x10E6/uL (ref 3.77–5.28)
RDW: 15.2 % (ref 12.3–15.4)
WBC: 7.6 10*3/uL (ref 3.4–10.8)

## 2018-01-24 LAB — COMPREHENSIVE METABOLIC PANEL
A/G RATIO: 1.5 (ref 1.2–2.2)
ALK PHOS: 114 IU/L (ref 39–117)
ALT: 12 IU/L (ref 0–32)
AST: 18 IU/L (ref 0–40)
Albumin: 4.3 g/dL (ref 3.5–4.8)
BILIRUBIN TOTAL: 0.2 mg/dL (ref 0.0–1.2)
BUN / CREAT RATIO: 24 (ref 12–28)
BUN: 14 mg/dL (ref 8–27)
CHLORIDE: 101 mmol/L (ref 96–106)
CO2: 24 mmol/L (ref 20–29)
Calcium: 9.9 mg/dL (ref 8.7–10.3)
Creatinine, Ser: 0.59 mg/dL (ref 0.57–1.00)
GFR calc Af Amer: 107 mL/min/{1.73_m2} (ref >59)
GFR calc non Af Amer: 93 mL/min/{1.73_m2} (ref >59)
GLUCOSE: 106 mg/dL — AB (ref 65–99)
Globulin, Total: 2.9 g/dL (ref 1.5–4.5)
POTASSIUM: 4.6 mmol/L (ref 3.5–5.2)
Sodium: 144 mmol/L (ref 134–144)
Total Protein: 7.2 g/dL (ref 6.0–8.5)

## 2018-01-24 LAB — LIPID PANEL
CHOL/HDL RATIO: 3.3 ratio (ref 0.0–4.4)
Cholesterol, Total: 200 mg/dL — ABNORMAL HIGH (ref 100–199)
HDL: 60 mg/dL (ref >39)
LDL CALC: 124 mg/dL — AB (ref 0–99)
Triglycerides: 80 mg/dL (ref 0–149)
VLDL CHOLESTEROL CAL: 16 mg/dL (ref 5–40)

## 2018-01-24 LAB — TSH: TSH: 0.861 u[IU]/mL (ref 0.450–4.500)

## 2018-01-24 NOTE — Telephone Encounter (Signed)
Left message advising pt. OK per DPR. 

## 2018-01-24 NOTE — Telephone Encounter (Signed)
-----   Message from Virginia Crews, MD sent at 01/24/2018 10:30 AM EDT ----- Normal Blood counts, kidney function, liver function, electrolytes, Thyroid function.  A1c is stable and remains in prediabetes range.  Recommend low carb diet and regular exercise.  Cholesterol is high.  10 year risk of heart disease/stroke is high at 11.5%.  Therefore, recommend statin medication to lower this risk.  Virginia Crews, MD, MPH [facility] 01/24/2018 10:30 AM

## 2018-01-24 NOTE — Telephone Encounter (Signed)
Pt advised as below. She declines statin medication. She states she has tried this in the past, and it makes her "feel bad". She states it causes her head to "feel funny". Dr. Venia Minks told her in the past that she could D/C the medication given her side effects. Pt believes her total cholesterol level of 200 is "great for my age", and she does not see the necessity of restarting this medication. I explained that her age and other risk factors are increasing her stroke or heart disease, and would be agreeable to try other options that are not a statin.

## 2018-01-24 NOTE — Telephone Encounter (Signed)
Though I would still recommend statin, this is up to the patient.  Other things, like fish oil supplements, can lower cholesterol, but they do not lower risk of heart disease/stroke, unfortunately.  This is what she can try though.  Michaela Crews, MD, MPH [facility] 01/24/2018 1:46 PM

## 2018-02-22 DIAGNOSIS — Z780 Asymptomatic menopausal state: Secondary | ICD-10-CM | POA: Diagnosis not present

## 2018-02-22 DIAGNOSIS — M8588 Other specified disorders of bone density and structure, other site: Secondary | ICD-10-CM | POA: Diagnosis not present

## 2018-02-22 DIAGNOSIS — M85852 Other specified disorders of bone density and structure, left thigh: Secondary | ICD-10-CM | POA: Diagnosis not present

## 2018-02-22 LAB — HM DEXA SCAN

## 2018-02-25 ENCOUNTER — Other Ambulatory Visit: Payer: Medicare Other

## 2018-03-07 ENCOUNTER — Other Ambulatory Visit: Payer: Self-pay | Admitting: Family Medicine

## 2018-03-07 DIAGNOSIS — Z1231 Encounter for screening mammogram for malignant neoplasm of breast: Secondary | ICD-10-CM

## 2018-03-12 ENCOUNTER — Telehealth: Payer: Self-pay | Admitting: Family Medicine

## 2018-03-12 NOTE — Telephone Encounter (Signed)
I see the report is scanned into patients chart.   Thanks,  -Joseline

## 2018-03-12 NOTE — Telephone Encounter (Signed)
Pt stated that she had her bone density test done at [facility] on 02/22/18 and is requesting the results. Please advise. Thanks TNP

## 2018-03-13 NOTE — Telephone Encounter (Signed)
Pt advised.

## 2018-03-13 NOTE — Telephone Encounter (Signed)
Bone density scan shows osteopenia (this is some bone loss, but not as bad as osteoporosis).  This has worsened since last study 2 yrs ago.  Recommend regular weight bearing exercise, avoiding smoking, and adequate Ca (1200mg /day) and Vit D (1000 units daily) via diet or supplement.  We will recheck in 2 years to ensure this hasn't worsened.  Virginia Crews, MD, MPH [facility] 03/13/2018 10:17 AM

## 2018-04-02 ENCOUNTER — Ambulatory Visit
Admission: RE | Admit: 2018-04-02 | Discharge: 2018-04-02 | Disposition: A | Discharge status: Home / Self Care | Payer: Medicare Other | Source: Ambulatory Visit | Attending: Family Medicine | Admitting: Family Medicine

## 2018-04-02 DIAGNOSIS — Z1231 Encounter for screening mammogram for malignant neoplasm of breast: Secondary | ICD-10-CM

## 2018-04-03 ENCOUNTER — Telehealth: Payer: Self-pay

## 2018-04-03 NOTE — Telephone Encounter (Signed)
-----   Message from Virginia Crews, MD sent at 04/03/2018  8:20 AM EDT ----- Normal mammogram.  Repeat in 1 yr  Bacigalupo, Dionne Bucy, MD, MPH [facility] 04/03/2018 8:20 AM

## 2018-04-03 NOTE — Telephone Encounter (Signed)
Pt advised.

## 2018-04-26 ENCOUNTER — Ambulatory Visit: Payer: Medicare Other

## 2018-04-26 ENCOUNTER — Ambulatory Visit (INDEPENDENT_AMBULATORY_CARE_PROVIDER_SITE_OTHER): Payer: Medicare Other

## 2018-04-26 DIAGNOSIS — E538 Deficiency of other specified B group vitamins: Secondary | ICD-10-CM

## 2018-04-26 MED ORDER — CYANOCOBALAMIN 1000 MCG/ML IJ SOLN
1000.0000 ug | Freq: Once | INTRAMUSCULAR | Status: AC
  Administered 2018-04-26: 1000 ug via INTRAMUSCULAR

## 2018-07-19 ENCOUNTER — Encounter: Payer: Self-pay | Admitting: Family Medicine

## 2018-07-19 ENCOUNTER — Ambulatory Visit (INDEPENDENT_AMBULATORY_CARE_PROVIDER_SITE_OTHER): Payer: Medicare Other | Admitting: Family Medicine

## 2018-07-19 VITALS — BP 135/80 | HR 86 | Temp 97.8°F | Resp 16 | Wt 147.6 lb

## 2018-07-19 DIAGNOSIS — R7303 Prediabetes: Secondary | ICD-10-CM

## 2018-07-19 DIAGNOSIS — E039 Hypothyroidism, unspecified: Secondary | ICD-10-CM

## 2018-07-19 DIAGNOSIS — I10 Essential (primary) hypertension: Secondary | ICD-10-CM | POA: Diagnosis not present

## 2018-07-19 DIAGNOSIS — Z23 Encounter for immunization: Secondary | ICD-10-CM

## 2018-07-19 DIAGNOSIS — E7800 Pure hypercholesterolemia, unspecified: Secondary | ICD-10-CM | POA: Diagnosis not present

## 2018-07-19 NOTE — Assessment & Plan Note (Signed)
Discussed low-carb diet Recheck A1c 

## 2018-07-19 NOTE — Assessment & Plan Note (Signed)
Well-controlled Continue lisinopril HCTZ at current dose Check CMP Follow-up in 6 months

## 2018-07-19 NOTE — Assessment & Plan Note (Signed)
Previously well controlled Continue Synthroid at current dose Recheck TSH

## 2018-07-19 NOTE — Assessment & Plan Note (Signed)
ASCVD was elevated at 11% at last check Patient is not on a statin Recheck lipid panel and CMP

## 2018-07-19 NOTE — Patient Instructions (Signed)
   The CDC recommends two doses of Shingrix (the shingles vaccine) separated by 2 to 6 months for adults age 72 years and older. I recommend checking with your insurance plan regarding coverage for this vaccine.     Diet Recommendations for Pre-Diabetes   Starchy (carb) foods include: Bread, rice, pasta, potatoes, corn, crackers, bagels, muffins, all baked goods.  (Fruits, milk, and yogurt also have carbohydrate, but most of these foods will not spike your blood sugar as the starchy foods will.)  A few fruits do cause high blood sugars; use small portions of bananas (limit to 1/2 at a time), grapes, watermelon, and most tropical fruits.    Protein foods include: Meat, fish, poultry, eggs, dairy foods, and beans such as pinto and kidney beans (beans also provide carbohydrate).    1. Limit starchy foods to TWO per meal and ONE per snack. ONE portion of a starchy  food is equal to the following:   - ONE slice of bread (or its equivalent, such as half of a hamburger bun).   - 1/2 cup of a "scoopable" starchy food such as potatoes or rice.   - 15 grams of carbohydrate as shown on food label.  2. Include at every meal: a protein food, a carb food, and vegetables and/or fruit.   - Obtain twice as many veg's as protein or carbohydrate foods for both lunch and dinner.   - Fresh or frozen veg's are best.   - Try to keep frozen veg's on hand for a quick vegetable serving.

## 2018-07-19 NOTE — Progress Notes (Signed)
Patient: Michaela Weber Female    DOB: Feb 18, 1946   72 y.o.   MRN: [id number] Visit Date: 07/19/2018  Today's Provider: Lavon Paganini, MD   Chief Complaint  Patient presents with  . Follow-up    HTN,Pre-diabetes,Hyperlipedemia   Subjective:    HPI  Hypertension, follow-up:  BP Readings from Last 3 Encounters:  07/19/18 135/80  01/17/18 116/72  01/15/18 128/64    She was last seen for hypertension 6 months ago.  BP at that visit was 116/72. Management since that visit includes no changes.She reports good compliance with treatment. She is not having side effects.  She is exercising. She is not adherent to low salt diet.   Outside blood pressures are 120/82. She is experiencing none.  Patient denies chest pain, chest pressure/discomfort, claudication, dyspnea, exertional chest pressure/discomfort, fatigue, irregular heart beat, lower extremity edema, near-syncope, orthopnea, palpitations, paroxysmal nocturnal dyspnea, syncope and tachypnea  Cardiovascular risk factors include advanced age (older than 25 for men, 78 for women), dyslipidemia and hypertension.  Use of agents associated with hypertension: thyroid hormones.   ------------------------------------------------------------------------  Lipid/Cholesterol, Follow-up:   Last seen for this 6 months ago.  Management since that visit includes recommended statin therapy. Patient declined. Recommended  fish oil.Patient reports that she is not doing the fish oil either.  Last Lipid Panel:    Component Value Date/Time   CHOL 200 (H) 01/23/2018 0840   TRIG 80 01/23/2018 0840   HDL 60 01/23/2018 0840   CHOLHDL 3.3 01/23/2018 0840   LDLCALC 124 (H) 01/23/2018 0840     Prediabetes, Follow-up:   Lab Results  Component Value Date   HGBA1C 6.2 (H) 01/23/2018   HGBA1C 5.9 07/19/2017   HGBA1C 6.3 01/17/2017   GLUCOSE 106 (H) 01/23/2018   GLUCOSE 114 (H) 07/19/2017   GLUCOSE 122 (H) 01/18/2017    Last seen  for for this6 months ago.  Management since that visit includes advised to follow a low carb diet. Current symptoms include none and have been stable.  Weight trend: increasing steadily Prior visit with dietician: no Current diet: well balanced Current exercise: walking  Pertinent Labs:    Component Value Date/Time   CHOL 200 (H) 01/23/2018 0840   TRIG 80 01/23/2018 0840   CHOLHDL 3.3 01/23/2018 0840   CREATININE 0.59 01/23/2018 0840   CREATININE 0.63 07/19/2017 0903    Wt Readings from Last 3 Encounters:  07/19/18 147 lb 9.6 oz (67 kg)  01/17/18 140 lb (63.5 kg)  01/15/18 140 lb (63.5 kg)    Hypothyroid, follow-up:  TSH  Date Value Ref Range Status  01/23/2018 0.861 0.450 - 4.500 uIU/mL Final  07/19/2017 0.75 0.40 - 4.50 mIU/L Final  01/18/2017 0.572 0.450 - 4.500 uIU/mL Final   Wt Readings from Last 3 Encounters:  07/19/18 147 lb 9.6 oz (67 kg)  01/17/18 140 lb (63.5 kg)  01/15/18 140 lb (63.5 kg)    She was last seen for hypothyroid 6 months ago.  Management since that visit includes no changes. She reports good compliance with treatment. She is not having side effects.  She is exercising. She is experiencing none She denies change in energy level, diarrhea, heat / cold intolerance, nervousness, palpitations and weight changes Weight trend: stable  -----------------------------------------------------------------------  Allergies  Allergen Reactions  . Codeine Nausea And Vomiting     Current Outpatient Medications:  .  aspirin 81 MG EC tablet, Take 81 mg by mouth daily.  , Disp: , Rfl:  .  Cyanocobalamin (B-12 COMPLIANCE INJECTION) 1000 MCG/ML KIT, Inject 1 mL as directed every 30 (thirty) days. (Patient taking differently: Inject 1 mL as directed. ), Disp: 1 kit, Rfl: 0 .  levothyroxine (SYNTHROID, LEVOTHROID) 125 MCG tablet, TAKE 1 TABLET(125 MCG) BY MOUTH DAILY, Disp: 90 tablet, Rfl: 3 .  lisinopril-hydrochlorothiazide (PRINZIDE,ZESTORETIC) 10-12.5 MG  tablet, TAKE 1 TABLET BY MOUTH EVERY DAY, Disp: 90 tablet, Rfl: 2 .  VITAMIN D, CHOLECALCIFEROL, PO, Take 2,000 Units by mouth daily. , Disp: , Rfl:   Review of Systems  Constitutional: Negative.   HENT: Negative.   Eyes: Negative.   Respiratory: Negative.   Cardiovascular: Negative.   Gastrointestinal: Negative.   Endocrine: Negative.   Genitourinary: Negative.   Musculoskeletal: Negative.   Skin: Negative.   Neurological: Negative.   Psychiatric/Behavioral: Negative.     Social History   Tobacco Use  . Smoking status: Never Smoker  . Smokeless tobacco: Never Used  . Tobacco comment: tobacco use - no  Substance Use Topics  . Alcohol use: No   Objective:   BP 135/80   Pulse 86   Temp 97.8 F (36.6 C) (Oral)   Resp 16   Wt 147 lb 9.6 oz (67 kg)   BMI 27.00 kg/m  Vitals:   07/19/18 0952 07/19/18 1009  BP: 140/80 135/80  Pulse: 86   Resp: 16   Temp: 97.8 F (36.6 C)   TempSrc: Oral   Weight: 147 lb 9.6 oz (67 kg)      Physical Exam  Constitutional: She is oriented to person, place, and time. She appears well-developed and well-nourished. No distress.  HENT:  Head: Normocephalic and atraumatic.  Right Ear: External ear normal.  Left Ear: External ear normal.  Nose: Nose normal.  Mouth/Throat: Oropharynx is clear and moist.  Eyes: Pupils are equal, round, and reactive to light. Conjunctivae and EOM are normal. No scleral icterus.  Neck: Neck supple. No thyromegaly present.  Cardiovascular: Normal rate, regular rhythm, normal heart sounds and intact distal pulses.  No murmur heard. Pulmonary/Chest: Effort normal and breath sounds normal. No respiratory distress. She has no wheezes. She has no rales.  Abdominal: Soft. She exhibits no distension. There is no tenderness.  Musculoskeletal: She exhibits no edema.  Lymphadenopathy:    She has no cervical adenopathy.  Neurological: She is alert and oriented to person, place, and time.  Skin: Skin is warm and dry.  Capillary refill takes less than 2 seconds. No rash noted.  Psychiatric: She has a normal mood and affect. Her behavior is normal.  Vitals reviewed.       Assessment & Plan:   Problem List Items Addressed This Visit      Cardiovascular and Mediastinum   Essential hypertension - Primary    Well-controlled Continue lisinopril HCTZ at current dose Check CMP Follow-up in 6 months      Relevant Orders   Comprehensive metabolic panel     Endocrine   Adult hypothyroidism    Previously well controlled Continue Synthroid at current dose Recheck TSH      Relevant Orders   TSH     Other   HLD (hyperlipidemia)    ASCVD was elevated at 11% at last check Patient is not on a statin Recheck lipid panel and CMP      Relevant Orders   Lipid panel   Comprehensive metabolic panel   Pre-diabetes    Discussed low-carb diet Recheck A1c      Relevant Orders   Hemoglobin A1c  Other Visit Diagnoses    Need for influenza vaccination       Relevant Orders   Flu vaccine HIGH DOSE PF (Completed)       Return in about 6 months (around 01/18/2019) for AWV/CPE.   The entirety of the information documented in the History of Present Illness, Review of Systems and Physical Exam were personally obtained by me. Portions of this information were initially documented by Tiburcio Pea and Lyndel Pleasure, CMA and reviewed by me for thoroughness and accuracy.    Virginia Crews, MD, MPH [facility] 07/19/2018 1:24 PM

## 2018-07-22 DIAGNOSIS — I10 Essential (primary) hypertension: Secondary | ICD-10-CM | POA: Diagnosis not present

## 2018-07-22 DIAGNOSIS — E039 Hypothyroidism, unspecified: Secondary | ICD-10-CM | POA: Diagnosis not present

## 2018-07-22 DIAGNOSIS — R7303 Prediabetes: Secondary | ICD-10-CM | POA: Diagnosis not present

## 2018-07-22 DIAGNOSIS — E7800 Pure hypercholesterolemia, unspecified: Secondary | ICD-10-CM | POA: Diagnosis not present

## 2018-07-23 ENCOUNTER — Telehealth: Payer: Self-pay

## 2018-07-23 LAB — LIPID PANEL
Chol/HDL Ratio: 3.4 ratio (ref 0.0–4.4)
Cholesterol, Total: 187 mg/dL (ref 100–199)
HDL: 55 mg/dL (ref >39)
LDL CALC: 112 mg/dL — AB (ref 0–99)
Triglycerides: 101 mg/dL (ref 0–149)
VLDL Cholesterol Cal: 20 mg/dL (ref 5–40)

## 2018-07-23 LAB — COMPREHENSIVE METABOLIC PANEL
A/G RATIO: 1.9 (ref 1.2–2.2)
ALT: 12 IU/L (ref 0–32)
AST: 18 IU/L (ref 0–40)
Albumin: 4.6 g/dL (ref 3.5–4.8)
Alkaline Phosphatase: 104 IU/L (ref 39–117)
BILIRUBIN TOTAL: 0.2 mg/dL (ref 0.0–1.2)
BUN/Creatinine Ratio: 18 (ref 12–28)
BUN: 13 mg/dL (ref 8–27)
CHLORIDE: 96 mmol/L (ref 96–106)
CO2: 25 mmol/L (ref 20–29)
Calcium: 9.9 mg/dL (ref 8.7–10.3)
Creatinine, Ser: 0.72 mg/dL (ref 0.57–1.00)
GFR calc non Af Amer: 84 mL/min/{1.73_m2} (ref >59)
GFR, EST AFRICAN AMERICAN: 97 mL/min/{1.73_m2} (ref >59)
GLOBULIN, TOTAL: 2.4 g/dL (ref 1.5–4.5)
Glucose: 98 mg/dL (ref 65–99)
POTASSIUM: 4.2 mmol/L (ref 3.5–5.2)
SODIUM: 141 mmol/L (ref 134–144)
TOTAL PROTEIN: 7 g/dL (ref 6.0–8.5)

## 2018-07-23 LAB — TSH: TSH: 0.583 u[IU]/mL (ref 0.450–4.500)

## 2018-07-23 LAB — HEMOGLOBIN A1C
ESTIMATED AVERAGE GLUCOSE: 126 mg/dL
Hgb A1c MFr Bld: 6 % — ABNORMAL HIGH (ref 4.8–5.6)

## 2018-07-23 NOTE — Telephone Encounter (Signed)
Patient advised.

## 2018-07-23 NOTE — Telephone Encounter (Signed)
-----   Message from Virginia Crews, MD sent at 07/23/2018  8:52 AM EDT ----- Cholesterol is slightly improved from 6 months ago, but roughly stable.  Hemoglobin A1c, 38-month average of blood sugars, is slightly better from 6 months ago, but remains in the prediabetic range at 6.  Normal thyroid function, kidney function, liver function, electrolytes.  Virginia Crews, MD, MPH [facility] 07/23/2018 8:52 AM

## 2018-07-28 ENCOUNTER — Other Ambulatory Visit: Payer: Self-pay | Admitting: Family Medicine

## 2018-07-28 DIAGNOSIS — E039 Hypothyroidism, unspecified: Secondary | ICD-10-CM

## 2018-07-31 ENCOUNTER — Ambulatory Visit (INDEPENDENT_AMBULATORY_CARE_PROVIDER_SITE_OTHER): Payer: Medicare Other

## 2018-07-31 DIAGNOSIS — E538 Deficiency of other specified B group vitamins: Secondary | ICD-10-CM

## 2018-07-31 MED ORDER — CYANOCOBALAMIN 1000 MCG/ML IJ SOLN
1000.0000 ug | Freq: Once | INTRAMUSCULAR | Status: AC
  Administered 2018-07-31: 1000 ug via INTRAMUSCULAR

## 2018-10-26 ENCOUNTER — Other Ambulatory Visit: Payer: Self-pay | Admitting: Family Medicine

## 2018-10-26 DIAGNOSIS — E039 Hypothyroidism, unspecified: Secondary | ICD-10-CM

## 2018-10-26 DIAGNOSIS — I10 Essential (primary) hypertension: Secondary | ICD-10-CM

## 2018-10-29 ENCOUNTER — Ambulatory Visit (INDEPENDENT_AMBULATORY_CARE_PROVIDER_SITE_OTHER): Payer: Medicare Other

## 2018-10-29 DIAGNOSIS — E538 Deficiency of other specified B group vitamins: Secondary | ICD-10-CM | POA: Diagnosis not present

## 2018-10-29 MED ORDER — CYANOCOBALAMIN 1000 MCG/ML IJ SOLN
1000.0000 ug | Freq: Once | INTRAMUSCULAR | Status: AC
  Administered 2018-10-29: 1000 ug via INTRAMUSCULAR

## 2019-01-20 ENCOUNTER — Ambulatory Visit: Payer: Self-pay

## 2019-01-24 ENCOUNTER — Other Ambulatory Visit: Payer: Self-pay | Admitting: Physician Assistant

## 2019-01-24 DIAGNOSIS — I10 Essential (primary) hypertension: Secondary | ICD-10-CM

## 2019-01-24 DIAGNOSIS — E039 Hypothyroidism, unspecified: Secondary | ICD-10-CM

## 2019-02-04 ENCOUNTER — Other Ambulatory Visit: Payer: Self-pay

## 2019-02-04 ENCOUNTER — Ambulatory Visit (INDEPENDENT_AMBULATORY_CARE_PROVIDER_SITE_OTHER): Payer: Medicare Other

## 2019-02-04 ENCOUNTER — Telehealth: Payer: Self-pay

## 2019-02-04 ENCOUNTER — Encounter: Payer: Medicare Other | Admitting: Family Medicine

## 2019-02-04 DIAGNOSIS — Z0000 Encounter for general adult medical examination without abnormal findings: Secondary | ICD-10-CM | POA: Diagnosis not present

## 2019-02-04 NOTE — Telephone Encounter (Signed)
Called pt to complete an AWV telephonically and she wanted to know if she can skip the B12 injection until her next apt on 06/15/18? Pt would currently would be due. Pt states she does not feel any different and normally does not after receiving these injections. Last B12 injection received 10/29/18. Please advise, thank you.  -MM

## 2019-02-04 NOTE — Progress Notes (Signed)
Subjective:   Michaela Weber is a 73 y.o. female who presents for Medicare Annual (Subsequent) preventive examination.    This visit is being conducted through telemedicine due to the COVID-19 pandemic. This patient has given me verbal consent via doximity to conduct this visit, patient states they are participating from their home address. Some vital signs may be absent or patient reported.    Patient identification: identified by name, DOB, and current address  Review of Systems:  N/A  Cardiac Risk Factors include: advanced age (>21mn, >>55women);dyslipidemia;hypertension     Objective:     Vitals: There were no vitals taken for this visit.  There is no height or weight on file to calculate BMI. Unable to obtain vitals due to visit being conducted via telephonically.   Advanced Directives 02/04/2019 01/15/2018 07/27/2017 05/16/2017 04/07/2016 02/16/2016  Does Patient Have a Medical Advance Directive? No No No No No No  Would patient like information on creating a medical advance directive? No - Patient declined Yes (MAU/Ambulatory/Procedural Areas - Information given) - Yes (ED - Information included in AVS) No - patient declined information -    Tobacco Social History   Tobacco Use  Smoking Status Never Smoker  Smokeless Tobacco Never Used  Tobacco Comment   tobacco use - no     Counseling given: Not Answered Comment: tobacco use - no   Clinical Intake:  Pre-visit preparation completed: Yes  Pain : No/denies pain Pain Score: 0-No pain     Nutritional Status: BMI 25 -29 Overweight Nutritional Risks: None  How often do you need to have someone help you when you read instructions, pamphlets, or other written materials from your doctor or pharmacy?: 1 - Never  Interpreter Needed?: No  Information entered by :: Michaela Weber  Past Medical History:  Diagnosis Date  . Anemia    in past  . Diastolic dysfunction   . Valvular disease    mild  . Wears contact  lenses    Past Surgical History:  Procedure Laterality Date  . CESAREAN SECTION     x2   . COLONOSCOPY  approx 2007  . COLONOSCOPY WITH PROPOFOL N/A 04/07/2016   Procedure: COLONOSCOPY WITH PROPOFOL;  Surgeon: Michaela Lame MD;  [facility]  Service: Endoscopy;  Laterality: N/A;  . GANGLION CYST EXCISION    . POLYPECTOMY  04/07/2016   Procedure: POLYPECTOMY;  Surgeon: Michaela Lame MD;  Location[facility]  Service: Endoscopy;;  . TONSILLECTOMY    . TUBAL LIGATION     with 2nd c-sect   Family History  Problem Relation Age of Onset  . Diabetes Father   . Alzheimer's disease Mother   . Healthy Brother   . Breast cancer Neg Hx   . Colon cancer Neg Hx   . Ovarian cancer Neg Hx    Social History   Socioeconomic History  . Marital status: Married    Spouse name: Michaela Weber . Number of children: 2  . Years of education: 140 . Highest education level: Associate degree: occupational, tHotel manager or vocational program  Occupational History    Employer: RETIRED  Social Needs  . Financial resource strain: Not hard at all  . Food insecurity:    Worry: Never true    Inability: Never true  . Transportation needs:    Medical: No    Non-medical: No  Tobacco Use  . Smoking status: Never Smoker  . Smokeless tobacco: Never Used  . Tobacco comment:  tobacco use - no  Substance and Sexual Activity  . Alcohol use: No  . Drug use: No  . Sexual activity: Not on file  Lifestyle  . Physical activity:    Days per week: 0 days    Minutes per session: 0 min  . Stress: To some extent  Relationships  . Social connections:    Talks on phone: Patient refused    Gets together: Patient refused    Attends religious service: Patient refused    Active member of club or organization: Patient refused    Attends meetings of clubs or organizations: Patient refused    Relationship status: Patient refused  Other Topics Concern  . Not on file  Social History Narrative   . Not on file    Outpatient Encounter Medications as of 02/04/2019  Medication Sig  . aspirin 81 MG EC tablet Take 81 mg by mouth daily.    . Cyanocobalamin (B-12 COMPLIANCE INJECTION) 1000 MCG/ML KIT Inject 1 mL as directed every 30 (thirty) days. (Patient taking differently: Inject 1 mL as directed. )  . levothyroxine (SYNTHROID) 125 MCG tablet TAKE 1 TABLET(125 MCG) BY MOUTH DAILY  . lisinopril-hydrochlorothiazide (ZESTORETIC) 10-12.5 MG tablet TAKE 1 TABLET BY MOUTH EVERY DAY  . VITAMIN D, CHOLECALCIFEROL, PO Take 2,000 Units by mouth daily.    No facility-administered encounter medications on file as of 02/04/2019.     Activities of Daily Living In your present state of health, do you have any difficulty performing the following activities: 02/04/2019  Hearing? N  Vision? N  Difficulty concentrating or making decisions? N  Walking or climbing stairs? N  Dressing or bathing? N  Doing errands, shopping? N  Preparing Food and eating ? N  Using the Toilet? N  In the past six months, have you accidently leaked urine? Y  Comment Occasionally with urges.  Do you have problems with loss of bowel control? N  Managing your Medications? N  Managing your Finances? N  Housekeeping or managing your Housekeeping? N  Some recent data might be hidden    Patient Care Team: Michaela Crews, MD as PCP - General (Family Medicine)    Assessment:   This is a routine wellness examination for Michaela Weber.  Exercise Activities and Dietary recommendations Current Exercise Habits: Home exercise routine, Type of exercise: Other - see comments(aerobics), Time (Minutes): 15, Frequency (Times/Week): 6, Weekly Exercise (Minutes/Week): 90, Exercise limited by: None identified  Goals    . DIET - INCREASE WATER INTAKE     Recommend increasing water intake to 4-6 glasses a day.     . Increase water intake     Recommend increasing water intake to 4-6 glasses a day.        Fall Risk: Fall Risk   02/04/2019 01/15/2018 05/16/2017 02/16/2016  Falls in the past year? 0 No No No    FALL RISK PREVENTION PERTAINING TO THE HOME:  Any stairs in or around the home? No If so, are there any without handrails? N/A  Home free of loose throw rugs in walkways, pet beds, electrical cords, etc? Yes  Adequate lighting in your home to reduce risk of falls? Yes   ASSISTIVE DEVICES UTILIZED TO PREVENT FALLS:  Life alert? No  Use of a cane, walker or w/c? No  Grab bars in the bathroom? Yes  Shower chair or bench in shower? No  Elevated toilet seat or a handicapped toilet? Yes    TIMED UP AND GO:  Was the test  performed? No .    Depression Screen PHQ 2/9 Scores 02/04/2019 02/04/2019 01/15/2018 05/16/2017  PHQ - 2 Score 0 0 0 0  PHQ- 9 Score 1 - - 0     Cognitive Function: Declined today.     6CIT Screen 05/16/2017  What Year? 0 points  What month? 0 points  What time? 0 points  Count back from 20 0 points  Months in reverse 0 points  Repeat phrase 0 points  Total Score 0    Immunization History  Administered Date(s) Administered  . Influenza, High Dose Seasonal PF 07/18/2016, 07/19/2018  . Influenza-Unspecified 06/22/2014  . Pneumococcal Conjugate-13 06/22/2014  . Pneumococcal Polysaccharide-23 11/24/2011    Qualifies for Shingles Vaccine? Yes . Due for Shingrix. Education has been provided regarding the importance of this vaccine. Pt has been advised to call insurance company to determine out of pocket expense. Advised may also receive vaccine at local pharmacy or Health Dept. Verbalized acceptance and understanding.  Tdap: Although this vaccine is not a covered service during a Wellness Exam, does the patient still wish to receive this vaccine today?  No . Advised may receive this vaccine at local pharmacy or Health Dept. Aware to provide a copy of the vaccination record if obtained from local pharmacy or Health Dept. Verbalized acceptance and understanding.  Flu Vaccine: Up to  date  Pneumococcal Vaccine: Up to date  Screening Tests Health Maintenance  Topic Date Due  . TETANUS/TDAP  09/25/2026 (Originally 03/20/1965)  . COLONOSCOPY  04/08/2019  . INFLUENZA VACCINE  04/26/2019  . DEXA SCAN  02/23/2020  . MAMMOGRAM  04/02/2020  . Hepatitis C Screening  Completed  . PNA vac Low Risk Adult  Completed    Cancer Screenings:  Colorectal Screening: Completed 04/07/16. Repeat every 3 years.  Mammogram: Completed 03/2018.   Bone Density: Completed 02/22/18. Results reflect OSTEOPOROSIS. Repeat every 2 years.   Lung Cancer Screening: (Low Dose CT Chest recommended if Age 31-80 years, 30 pack-year currently smoking OR have quit w/in 15years.) does not qualify.   Additional Screening:  Hepatitis C Screening: Up to date  Vision Screening: Recommended annual ophthalmology exams for early detection of glaucoma and other disorders of the eye.  Dental Screening: Recommended annual dental exams for proper oral hygiene  Community Resource Referral:  CRR required this visit?  No       Plan:  I have personally reviewed and addressed the Medicare Annual Wellness questionnaire and have noted the following in the patient's chart:  A. Medical and social history B. Use of alcohol, tobacco or illicit drugs  C. Current medications and supplements D. Functional ability and status E.  Nutritional status F.  Physical activity G. Advance directives H. List of other physicians I.  Hospitalizations, surgeries, and ER visits in previous 12 months J.  [place] such as hearing and vision if needed, cognitive and depression L. Referrals and appointments   In addition, I have reviewed and discussed with patient certain preventive protocols, quality metrics, and best practice recommendations. A written personalized care plan for preventive services as well as general preventive health recommendations were provided to patient. Nurse Health Advisor  Signed,     Kerriann Kamphuis [place], [place]  [id number]/41/9622 Nurse Health Advisor   Nurse Notes: Pt declined a future order for a tetanus vaccine.

## 2019-02-04 NOTE — Patient Instructions (Addendum)
Michaela Weber , Thank you for taking time to come for your Medicare Wellness Visit. I appreciate your ongoing commitment to your health goals. Please review the following plan we discussed and let me know if I can assist you in the future.   Screening recommendations/referrals: Colonoscopy: Up to date, due 03/2019 Mammogram: Up to date, due 03/2020 Bone Density: Up to date, due 01/2020 Recommended yearly ophthalmology/optometry visit for glaucoma screening and checkup Recommended yearly dental visit for hygiene and checkup  Vaccinations: Influenza vaccine: Up to date Pneumococcal vaccine: Completed series Tdap vaccine: Pt declines today.  Shingles vaccine: Pt declines today.     Advanced directives: Advance directive discussed with you today. Even though you declined this today please call our office should you change your mind and we can give you the proper paperwork for you to fill out.  Conditions/risks identified: Continue to increase water intake to 6-8 8 oz glasses a day.  Next appointment: 06/16/19 @ 9:00 AM with Dr Brita Romp. Declined scheduling an AWV for 2021 at this time.    Preventive Care 50 Years and Older, Female Preventive care refers to lifestyle choices and visits with your health care provider that can promote health and wellness. What does preventive care include?  A yearly physical exam. This is also called an annual well check.  Dental exams once or twice a year.  Routine eye exams. Ask your health care provider how often you should have your eyes checked.  Personal lifestyle choices, including:  Daily care of your teeth and gums.  Regular physical activity.  Eating a healthy diet.  Avoiding tobacco and drug use.  Limiting alcohol use.  Practicing safe sex.  Taking low-dose aspirin every day.  Taking vitamin and mineral supplements as recommended by your health care provider. What happens during an annual well check? The services and screenings  done by your health care provider during your annual well check will depend on your age, overall health, lifestyle risk factors, and family history of disease. Counseling  Your health care provider may ask you questions about your:  Alcohol use.  Tobacco use.  Drug use.  Emotional well-being.  Home and relationship well-being.  Sexual activity.  Eating habits.  History of falls.  Memory and ability to understand (cognition).  Work and work Statistician.  Reproductive health. Screening  You may have the following tests or measurements:  Height, weight, and BMI.  Blood pressure.  Lipid and cholesterol levels. These may be checked every 5 years, or more frequently if you are over 10 years old.  Skin check.  Lung cancer screening. You may have this screening every year starting at age 73 if you have a 30-pack-year history of smoking and currently smoke or have quit within the past 15 years.  Fecal occult blood test (FOBT) of the stool. You may have this test every year starting at age 67.  Flexible sigmoidoscopy or colonoscopy. You may have a sigmoidoscopy every 5 years or a colonoscopy every 10 years starting at age 18.  Hepatitis C blood test.  Hepatitis B blood test.  Sexually transmitted disease (STD) testing.  Diabetes screening. This is done by checking your blood sugar (glucose) after you have not eaten for a while (fasting). You may have this done every 1-3 years.  Bone density scan. This is done to screen for osteoporosis. You may have this done starting at age 13.  Mammogram. This may be done every 1-2 years. Talk to your health care provider about how  often you should have regular mammograms. Talk with your health care provider about your test results, treatment options, and if necessary, the need for more tests. Vaccines  Your health care provider may recommend certain vaccines, such as:  Influenza vaccine. This is recommended every year.  Tetanus,  diphtheria, and acellular pertussis (Tdap, Td) vaccine. You may need a Td booster every 10 years.  Zoster vaccine. You may need this after age 82.  Pneumococcal 13-valent conjugate (PCV13) vaccine. One dose is recommended after age 1.  Pneumococcal polysaccharide (PPSV23) vaccine. One dose is recommended after age 79. Talk to your health care provider about which screenings and vaccines you need and how often you need them. This information is not intended to replace advice given to you by your health care provider. Make sure you discuss any questions you have with your health care provider. Document Released: 10/08/2015 Document Revised: 05/31/2016 Document Reviewed: 07/13/2015 Elsevier Interactive Patient Education  2017 [place] Prevention in the Home Falls can cause injuries. They can happen to people of all ages. There are many things you can do to make your home safe and to help prevent falls. What can I do on the outside of my home?  Regularly fix the edges of walkways and driveways and fix any cracks.  Remove anything that might make you trip as you walk through a door, such as a raised step or threshold.  Trim any bushes or trees on the path to your home.  Use bright outdoor lighting.  Clear any walking paths of anything that might make someone trip, such as rocks or tools.  Regularly check to see if handrails are loose or broken. Make sure that both sides of any steps have handrails.  Any raised decks and porches should have guardrails on the edges.  Have any leaves, snow, or ice cleared regularly.  Use sand or salt on walking paths during winter.  Clean up any spills in your garage right away. This includes oil or grease spills. What can I do in the bathroom?  Use night lights.  Install grab bars by the toilet and in the tub and shower. Do not use towel bars as grab bars.  Use non-skid mats or decals in the tub or shower.  If you need to sit down in  the shower, use a plastic, non-slip stool.  Keep the floor dry. Clean up any water that spills on the floor as soon as it happens.  Remove soap buildup in the tub or shower regularly.  Attach bath mats securely with double-sided non-slip rug tape.  Do not have throw rugs and other things on the floor that can make you trip. What can I do in the bedroom?  Use night lights.  Make sure that you have a light by your bed that is easy to reach.  Do not use any sheets or blankets that are too big for your bed. They should not hang down onto the floor.  Have a firm chair that has side arms. You can use this for support while you get dressed.  Do not have throw rugs and other things on the floor that can make you trip. What can I do in the kitchen?  Clean up any spills right away.  Avoid walking on wet floors.  Keep items that you use a lot in easy-to-reach places.  If you need to reach something above you, use a strong step stool that has a grab bar.  Keep electrical  cords out of the way.  Do not use floor polish or wax that makes floors slippery. If you must use wax, use non-skid floor wax.  Do not have throw rugs and other things on the floor that can make you trip. What can I do with my stairs?  Do not leave any items on the stairs.  Make sure that there are handrails on both sides of the stairs and use them. Fix handrails that are broken or loose. Make sure that handrails are as long as the stairways.  Check any carpeting to make sure that it is firmly attached to the stairs. Fix any carpet that is loose or worn.  Avoid having throw rugs at the top or bottom of the stairs. If you do have throw rugs, attach them to the floor with carpet tape.  Make sure that you have a light switch at the top of the stairs and the bottom of the stairs. If you do not have them, ask someone to add them for you. What else can I do to help prevent falls?  Wear shoes that:  Do not have high  heels.  Have rubber bottoms.  Are comfortable and fit you well.  Are closed at the toe. Do not wear sandals.  If you use a stepladder:  Make sure that it is fully opened. Do not climb a closed stepladder.  Make sure that both sides of the stepladder are locked into place.  Ask someone to hold it for you, if possible.  Clearly mark and make sure that you can see:  Any grab bars or handrails.  First and last steps.  Where the edge of each step is.  Use tools that help you move around (mobility aids) if they are needed. These include:  Canes.  Walkers.  Scooters.  Crutches.  Turn on the lights when you go into a dark area. Replace any light bulbs as soon as they burn out.  Set up your furniture so you have a clear path. Avoid moving your furniture around.  If any of your floors are uneven, fix them.  If there are any pets around you, be aware of where they are.  Review your medicines with your doctor. Some medicines can make you feel dizzy. This can increase your chance of falling. Ask your doctor what other things that you can do to help prevent falls. This information is not intended to replace advice given to you by your health care provider. Make sure you discuss any questions you have with your health care provider. Document Released: 07/08/2009 Document Revised: 02/17/2016 Document Reviewed: 10/16/2014 Elsevier Interactive Patient Education  2017 [employer].

## 2019-02-05 NOTE — Telephone Encounter (Signed)
If she is feeling well and asymptomatic, it is ok for her to skip the injection if she'd rather

## 2019-02-05 NOTE — Telephone Encounter (Signed)
Patient states she is feeling well and asymptomatic. Patient advised it is okay to get B12 at next OV.

## 2019-03-04 ENCOUNTER — Other Ambulatory Visit: Payer: Self-pay | Admitting: Family Medicine

## 2019-03-04 DIAGNOSIS — Z1231 Encounter for screening mammogram for malignant neoplasm of breast: Secondary | ICD-10-CM

## 2019-04-15 ENCOUNTER — Ambulatory Visit
Admission: RE | Admit: 2019-04-15 | Discharge: 2019-04-15 | Disposition: A | Discharge status: Home / Self Care | Payer: Medicare Other | Source: Ambulatory Visit | Attending: Family Medicine | Admitting: Family Medicine

## 2019-04-15 ENCOUNTER — Other Ambulatory Visit: Payer: Self-pay

## 2019-04-15 DIAGNOSIS — Z1231 Encounter for screening mammogram for malignant neoplasm of breast: Secondary | ICD-10-CM | POA: Diagnosis not present

## 2019-04-16 ENCOUNTER — Telehealth: Payer: Self-pay

## 2019-04-16 NOTE — Telephone Encounter (Signed)
Patient advised as directed below. 

## 2019-04-16 NOTE — Telephone Encounter (Signed)
-----   Message from Virginia Crews, MD sent at 04/16/2019 11:06 AM EDT ----- Normal mammogram. Repeat in 1 yr

## 2019-04-24 ENCOUNTER — Other Ambulatory Visit: Payer: Self-pay | Admitting: Family Medicine

## 2019-04-24 DIAGNOSIS — E039 Hypothyroidism, unspecified: Secondary | ICD-10-CM

## 2019-04-24 MED ORDER — LEVOTHYROXINE SODIUM 125 MCG PO TABS: 125.0000 ug | ORAL_TABLET | Freq: Every day | ORAL | 0 refills | Status: DC

## 2019-04-24 MED ORDER — HYDROCHLOROTHIAZIDE 12.5 MG PO TABS: 12.5000 mg | ORAL_TABLET | Freq: Every day | ORAL | 0 refills | Status: DC

## 2019-04-24 MED ORDER — LISINOPRIL 10 MG PO TABS: 10.0000 mg | ORAL_TABLET | Freq: Every day | ORAL | 0 refills | Status: DC

## 2019-04-24 NOTE — Telephone Encounter (Signed)
Please review

## 2019-04-24 NOTE — Telephone Encounter (Signed)
[facility] faxed refill request for the following medications:  levothyroxine (SYNTHROID) 125 MCG tablet   Please advise.  

## 2019-04-24 NOTE — Telephone Encounter (Signed)
[facility] sent fax stating that lisinopril-hydrochlorothiazide (ZESTORETIC) 10-12.5 MG tablet is currently on back order and are requesting we send new Rx for both medications separately. Please advise. Thanks TNP

## 2019-04-24 NOTE — Telephone Encounter (Signed)
Rx for separate lisinopril and HCTZ tabs sent, along with refill of Synthroid

## 2019-06-16 ENCOUNTER — Ambulatory Visit (INDEPENDENT_AMBULATORY_CARE_PROVIDER_SITE_OTHER): Payer: Medicare Other | Admitting: Family Medicine

## 2019-06-16 ENCOUNTER — Encounter: Payer: Self-pay | Admitting: Family Medicine

## 2019-06-16 ENCOUNTER — Other Ambulatory Visit: Payer: Self-pay

## 2019-06-16 VITALS — BP 134/78 | HR 83 | Temp 96.8°F | Ht 62.0 in | Wt 142.2 lb

## 2019-06-16 DIAGNOSIS — E7800 Pure hypercholesterolemia, unspecified: Secondary | ICD-10-CM

## 2019-06-16 DIAGNOSIS — Z0000 Encounter for general adult medical examination without abnormal findings: Secondary | ICD-10-CM | POA: Diagnosis not present

## 2019-06-16 DIAGNOSIS — D531 Other megaloblastic anemias, not elsewhere classified: Secondary | ICD-10-CM

## 2019-06-16 DIAGNOSIS — Z86010 Personal history of colonic polyps: Secondary | ICD-10-CM

## 2019-06-16 DIAGNOSIS — E663 Overweight: Secondary | ICD-10-CM | POA: Insufficient documentation

## 2019-06-16 DIAGNOSIS — I10 Essential (primary) hypertension: Secondary | ICD-10-CM | POA: Diagnosis not present

## 2019-06-16 DIAGNOSIS — E559 Vitamin D deficiency, unspecified: Secondary | ICD-10-CM

## 2019-06-16 DIAGNOSIS — Z23 Encounter for immunization: Secondary | ICD-10-CM

## 2019-06-16 DIAGNOSIS — Z1211 Encounter for screening for malignant neoplasm of colon: Secondary | ICD-10-CM | POA: Diagnosis not present

## 2019-06-16 DIAGNOSIS — R7303 Prediabetes: Secondary | ICD-10-CM

## 2019-06-16 DIAGNOSIS — E039 Hypothyroidism, unspecified: Secondary | ICD-10-CM

## 2019-06-16 NOTE — Assessment & Plan Note (Signed)
Well controlled Continue current medications Recheck metabolic panel F/u in 6 months  

## 2019-06-16 NOTE — Patient Instructions (Signed)
Preventive Care 73 Years and Older, Female Preventive care refers to lifestyle choices and visits with your health care provider that can promote health and wellness. This includes:  A yearly physical exam. This is also called an annual well check.  Regular dental and eye exams.  Immunizations.  Screening for certain conditions.  Healthy lifestyle choices, such as diet and exercise. What can I expect for my preventive care visit? Physical exam Your health care provider will check:  Height and weight. These may be used to calculate body mass index (BMI), which is a measurement that tells if you are at a healthy weight.  Heart rate and blood pressure.  Your skin for abnormal spots. Counseling Your health care provider may ask you questions about:  Alcohol, tobacco, and drug use.  Emotional well-being.  Home and relationship well-being.  Sexual activity.  Eating habits.  History of falls.  Memory and ability to understand (cognition).  Work and work Statistician.  Pregnancy and menstrual history. What immunizations do I need?  Influenza (flu) vaccine  This is recommended every year. Tetanus, diphtheria, and pertussis (Tdap) vaccine  You may need a Td booster every 10 years. Varicella (chickenpox) vaccine  You may need this vaccine if you have not already been vaccinated. Zoster (shingles) vaccine  You may need this after age 73. Pneumococcal conjugate (PCV13) vaccine  One dose is recommended after age 73. Pneumococcal polysaccharide (PPSV23) vaccine  One dose is recommended after age 73. Measles, mumps, and rubella (MMR) vaccine  You may need at least one dose of MMR if you were born in 1957 or later. You may also need a second dose. Meningococcal conjugate (MenACWY) vaccine  You may need this if you have certain conditions. Hepatitis A vaccine  You may need this if you have certain conditions or if you travel or work in places where you may be exposed  to hepatitis A. Hepatitis B vaccine  You may need this if you have certain conditions or if you travel or work in places where you may be exposed to hepatitis B. Haemophilus influenzae type b (Hib) vaccine  You may need this if you have certain conditions. You may receive vaccines as individual doses or as more than one vaccine together in one shot (combination vaccines). Talk with your health care provider about the risks and benefits of combination vaccines. What tests do I need? Blood tests  Lipid and cholesterol levels. These may be checked every 5 years, or more frequently depending on your overall health.  Hepatitis C test.  Hepatitis B test. Screening  Lung cancer screening. You may have this screening every year starting at age 73 if you have a 30-pack-year history of smoking and currently smoke or have quit within the past 15 years.  Colorectal cancer screening. All adults should have this screening starting at age 73 and continuing until age 15. Your health care provider may recommend screening at age 73 if you are at increased risk. You will have tests every 1-10 years, depending on your results and the type of screening test.  Diabetes screening. This is done by checking your blood sugar (glucose) after you have not eaten for a while (fasting). You may have this done every 1-3 years.  Mammogram. This may be done every 1-2 years. Talk with your health care provider about how often you should have regular mammograms.  BRCA-related cancer screening. This may be done if you have a family history of breast, ovarian, tubal, or peritoneal cancers.  Other tests  Sexually transmitted disease (STD) testing.  Bone density scan. This is done to screen for osteoporosis. You may have this done starting at age 73. Follow these instructions at home: Eating and drinking  Eat a diet that includes fresh fruits and vegetables, whole grains, lean protein, and low-fat dairy products. Limit  your intake of foods with high amounts of sugar, saturated fats, and salt.  Take vitamin and mineral supplements as recommended by your health care provider.  Do not drink alcohol if your health care provider tells you not to drink.  If you drink alcohol: ? Limit how much you have to 0-1 drink a day. ? Be aware of how much alcohol is in your drink. In the [place] one drink equals one 12 oz bottle of beer (355 mL), one 5 oz glass of wine (148 mL), or one 1 oz glass of hard liquor (44 mL). Lifestyle  Take daily care of your teeth and gums.  Stay active. Exercise for at least 30 minutes on 5 or more days each week.  Do not use any products that contain nicotine or tobacco, such as cigarettes, e-cigarettes, and chewing tobacco. If you need help quitting, ask your health care provider.  If you are sexually active, practice safe sex. Use a condom or other form of protection in order to prevent STIs (sexually transmitted infections).  Talk with your health care provider about taking a low-dose aspirin or statin. What's next?  Go to your health care provider once a year for a well check visit.  Ask your health care provider how often you should have your eyes and teeth checked.  Stay up to date on all vaccines. This information is not intended to replace advice given to you by your health care provider. Make sure you discuss any questions you have with your health care provider. Document Released: 10/08/2015 Document Revised: 09/05/2018 Document Reviewed: 09/05/2018 Elsevier Patient Education  2020 [employer].

## 2019-06-16 NOTE — Progress Notes (Signed)
Patient: Michaela Weber, Female    DOB: 08/19/1946, 73 y.o.   MRN: [id number] Visit Date: 06/16/2019  Today's Provider: Lavon Paganini, MD   Chief Complaint  Patient presents with   Annual Exam   Subjective:    I, Porsha McClurkin CMA, am acting as a scribe for Lavon Paganini, MD.     AWE w/ [facility]enzie was on 02/04/2019.  Complete Physical Tierany CALIOPE Weber is a 73 y.o. female. She feels well. She reports exercising some per pt. She reports she is sleeping well.  It has stressful dealing with her husband's  ----------------------------------------------------------- Last mammogram:04/15/2019 Last colonoscopy:04/07/2016  Review of Systems  Constitutional: Negative.   HENT: Negative.   Eyes: Negative.   Respiratory: Negative.   Cardiovascular: Negative.   Gastrointestinal: Negative.   Endocrine: Negative.   Genitourinary: Negative.   Musculoskeletal: Negative.   Skin: Negative.   Allergic/Immunologic: Negative.   Neurological: Negative.   Hematological: Negative.   Psychiatric/Behavioral: Negative.     Social History   Socioeconomic History   Marital status: Married    Spouse name: Shine Scrogham   Number of children: 2   Years of education: 14   Highest education level: Associate degree: occupational, Hotel manager, or vocational program  Occupational History    Employer: RETIRED  Scientist, product/process development strain: Not hard at all   Food insecurity    Worry: Never true    Inability: Never true   Transportation needs    Medical: No    Non-medical: No  Tobacco Use   Smoking status: Never Smoker   Smokeless tobacco: Never Used   Tobacco comment: tobacco use - no  Substance and Sexual Activity   Alcohol use: No   Drug use: No   Sexual activity: Not on file  Lifestyle   Physical activity    Days per week: 0 days    Minutes per session: 0 min   Stress: To some extent  Relationships   Social connections    Talks on phone: Patient  refused    Gets together: Patient refused    Attends religious service: Patient refused    Active member of club or organization: Patient refused    Attends meetings of clubs or organizations: Patient refused    Relationship status: Patient refused   Intimate partner violence    Fear of current or ex partner: Patient refused    Emotionally abused: Patient refused    Physically abused: Patient refused    Forced sexual activity: Patient refused  Other Topics Concern   Not on file  Social History Narrative   Not on file    Past Medical History:  Diagnosis Date   Anemia    in past   Diastolic dysfunction    Valvular disease    mild   Wears contact lenses      Patient Active Problem List   Diagnosis Date Noted   Overweight 06/16/2019   Osteopenia 01/17/2018   History of adenomatous polyp of colon    Allergic rhinitis 02/16/2016   CD (contact dermatitis) 02/16/2016   Dyshidrotic eczema 02/16/2016   Fatigue 02/16/2016   Acid reflux 02/16/2016   Pre-diabetes 02/16/2016   Vitamin D deficiency 07/14/2015   Essential hypertension 07/14/2015   HLD (hyperlipidemia) 07/14/2015   MURMUR 06/16/2010   Disorder of mitral valve 05/15/2007   Megaloblastic anemia due to B12 deficiency 04/19/2006   Adult hypothyroidism 02/06/2006    Past Surgical History:  Procedure Laterality Date  CESAREAN SECTION     x2    COLONOSCOPY  approx 2007   COLONOSCOPY WITH PROPOFOL N/A 04/07/2016   Procedure: COLONOSCOPY WITH PROPOFOL;  Surgeon: Lucilla Lame, MD;  Location: [facility];  Service: Endoscopy;  Laterality: N/A;   GANGLION CYST EXCISION     POLYPECTOMY  04/07/2016   Procedure: POLYPECTOMY;  Surgeon: Lucilla Lame, MD;  Location: [facility];  Service: Endoscopy;;   TONSILLECTOMY     TUBAL LIGATION     with 2nd c-sect    Her family history includes Alzheimer's disease in her mother; Diabetes in her father; Healthy in her brother. There is no  history of Breast cancer, Colon cancer, or Ovarian cancer.   Current Outpatient Medications:    aspirin 81 MG EC tablet, Take 81 mg by mouth daily.  , Disp: , Rfl:    Cyanocobalamin (B-12 COMPLIANCE INJECTION) 1000 MCG/ML KIT, Inject 1 mL as directed every 30 (thirty) days. (Patient taking differently: Inject 1 mL as directed. ), Disp: 1 kit, Rfl: 0   hydrochlorothiazide (HYDRODIURIL) 12.5 MG tablet, Take 1 tablet (12.5 mg total) by mouth daily., Disp: 90 tablet, Rfl: 0   levothyroxine (SYNTHROID) 125 MCG tablet, Take 1 tablet (125 mcg total) by mouth daily before breakfast., Disp: 90 tablet, Rfl: 0   lisinopril (ZESTRIL) 10 MG tablet, Take 1 tablet (10 mg total) by mouth daily., Disp: 90 tablet, Rfl: 0   lisinopril-hydrochlorothiazide (ZESTORETIC) 10-12.5 MG tablet, TAKE 1 TABLET BY MOUTH EVERY DAY, Disp: 90 tablet, Rfl: 0   VITAMIN D, CHOLECALCIFEROL, PO, Take 2,000 Units by mouth daily. , Disp: , Rfl:   Patient Care Team: Virginia Crews, MD as PCP - General (Family Medicine)     Objective:    Vitals: BP 134/78 (BP Location: Left Arm, Patient Position: Sitting, Cuff Size: Normal)    Pulse 83    Temp (!) 96.8 F (36 C) (Temporal)    Ht '5\' 2"'  (1.575 m)    Wt 142 lb 3.2 oz (64.5 kg)    SpO2 97%    BMI 26.01 kg/m   Physical Exam Vitals signs reviewed.  Constitutional:      General: She is not in acute distress.    Appearance: Normal appearance. She is well-developed. She is not diaphoretic.  HENT:     Head: Normocephalic and atraumatic.     Right Ear: Tympanic membrane, ear canal and external ear normal.     Left Ear: Tympanic membrane, ear canal and external ear normal.  Eyes:     General: No scleral icterus.    Extraocular Movements: Extraocular movements intact.     Conjunctiva/sclera: Conjunctivae normal.     Pupils: Pupils are equal, round, and reactive to light.  Neck:     Musculoskeletal: Neck supple.     Thyroid: No thyromegaly.  Cardiovascular:     Rate and  Rhythm: Normal rate and regular rhythm.     Pulses: Normal pulses.     Heart sounds: Normal heart sounds. No murmur.  Pulmonary:     Effort: Pulmonary effort is normal. No respiratory distress.     Breath sounds: Normal breath sounds. No wheezing or rales.  Abdominal:     General: There is no distension.     Palpations: Abdomen is soft.     Tenderness: There is no abdominal tenderness.  Musculoskeletal:        General: No deformity.     Right lower leg: No edema.     Left lower  leg: No edema.  Lymphadenopathy:     Cervical: No cervical adenopathy.  Skin:    General: Skin is warm and dry.     Capillary Refill: Capillary refill takes less than 2 seconds.     Findings: No rash.  Neurological:     Mental Status: She is alert and oriented to person, place, and time. Mental status is at baseline.  Psychiatric:        Mood and Affect: Mood normal.        Behavior: Behavior normal.        Thought Content: Thought content normal.     Activities of Daily Living In your present state of health, do you have any difficulty performing the following activities: 06/16/2019 02/04/2019  Hearing? N N  Vision? N N  Difficulty concentrating or making decisions? N N  Walking or climbing stairs? N N  Dressing or bathing? N N  Doing errands, shopping? N N  Preparing Food and eating ? - N  Using the Toilet? - N  In the past six months, have you accidently leaked urine? - Y  Comment - Occasionally with urges.  Do you have problems with loss of bowel control? - N  Managing your Medications? - N  Managing your Finances? - N  Housekeeping or managing your Housekeeping? - N  Some recent data might be hidden    Fall Risk Assessment Fall Risk  06/16/2019 02/04/2019 01/15/2018 05/16/2017 02/16/2016  Falls in the past year? 0 0 No No No  Number falls in past yr: 0 - - - -  Injury with Fall? 0 - - - -     Depression Screen PHQ 2/9 Scores 06/16/2019 02/04/2019 02/04/2019 01/15/2018  PHQ - 2 Score 0 0 0 0    PHQ- 9 Score 0 1 - -    6CIT Screen 05/16/2017  What Year? 0 points  What month? 0 points  What time? 0 points  Count back from 20 0 points  Months in reverse 0 points  Repeat phrase 0 points  Total Score 0       Assessment & Plan:    Annual Physical Reviewed patient's Family Medical History Reviewed and updated list of patient's medical providers Assessment of cognitive impairment was done Assessed patient's functional ability Established a written schedule for health screening [place] Completed and Reviewed  Exercise Activities and Dietary recommendations Goals     DIET - INCREASE WATER INTAKE     Recommend increasing water intake to 4-6 glasses a day.      Increase water intake     Recommend increasing water intake to 4-6 glasses a day.        Immunization History  Administered Date(s) Administered   Fluad Quad(high Dose 65+) 06/16/2019   Influenza, High Dose Seasonal PF 07/18/2016, 07/19/2018   Influenza-Unspecified 06/22/2014   Pneumococcal Conjugate-13 06/22/2014   Pneumococcal Polysaccharide-23 11/24/2011    Health Maintenance  Topic Date Due   COLONOSCOPY  04/08/2019   INFLUENZA VACCINE  04/26/2019   TETANUS/TDAP  09/25/2026 (Originally 03/20/1965)   DEXA SCAN  02/23/2020   MAMMOGRAM  04/14/2021   Hepatitis C Screening  Completed   PNA vac Low Risk Adult  Completed     Discussed health benefits of physical activity, and encouraged her to engage in regular exercise appropriate for her age and condition.    ------------------------------------------------------------------------------------------------------------  Problem List Items Addressed This Visit      Cardiovascular and Mediastinum   Essential hypertension  Well controlled Continue current medications Recheck metabolic panel F/u in 6 months       Relevant Orders   Comprehensive metabolic panel     Endocrine   Adult hypothyroidism     Previously well controlled Patient is asymptomatic Continue Synthroid at current dose Recheck TSH      Relevant Orders   TSH     Other   Vitamin D deficiency    Recheck vitamin D level      Relevant Orders   VITAMIN D 25 Hydroxy (Vit-D Deficiency, Fractures)   HLD (hyperlipidemia)    Patient is not currently on a statin Recheck lipid panel and CMP Calculate ASCVD risk to determine need for primary prevention      Relevant Orders   Lipid panel   Comprehensive metabolic panel   Megaloblastic anemia due to B12 deficiency    Patient has not been getting B12 shots recently due to the pandemic She was previously getting injections every 2 to 3 months Recheck B12 level and CBC      Relevant Orders   B12   CBC w/Diff/Platelet   Pre-diabetes    Discussed low-carb diet Recheck A1c      Relevant Orders   Hemoglobin A1c   History of adenomatous polyp of colon   Relevant Orders   Ambulatory referral to Gastroenterology   Overweight    Discussed importance of healthy weight management Discussed diet and exercise       Other Visit Diagnoses    Encounter for annual physical exam    -  Primary   Screening for colon cancer       Relevant Orders   Ambulatory referral to Gastroenterology   Need for influenza vaccination       Relevant Orders   Flu Vaccine QUAD High Dose(Fluad) (Completed)       Return in about 6 months (around 12/14/2019) for chronic disease f/u.   The entirety of the information documented in the History of Present Illness, Review of Systems and Physical Exam were personally obtained by me. Portions of this information were initially documented by [facility], CMA and reviewed by me for thoroughness and accuracy.    Kairie Vangieson, Dionne Bucy, MD MPH [facility]

## 2019-06-16 NOTE — Assessment & Plan Note (Signed)
Patient has not been getting B12 shots recently due to the pandemic She was previously getting injections every 2 to 3 months Recheck B12 level and CBC

## 2019-06-16 NOTE — Assessment & Plan Note (Signed)
Recheck vitamin D level 

## 2019-06-16 NOTE — Assessment & Plan Note (Signed)
Discussed low-carb diet Recheck A1c 

## 2019-06-16 NOTE — Assessment & Plan Note (Signed)
Previously well controlled Patient is asymptomatic Continue Synthroid at current dose Recheck TSH

## 2019-06-16 NOTE — Assessment & Plan Note (Signed)
Patient is not currently on a statin Recheck lipid panel and CMP Calculate ASCVD risk to determine need for primary prevention

## 2019-06-16 NOTE — Assessment & Plan Note (Signed)
Discussed importance of healthy weight management Discussed diet and exercise  

## 2019-06-25 DIAGNOSIS — R7303 Prediabetes: Secondary | ICD-10-CM | POA: Diagnosis not present

## 2019-06-25 DIAGNOSIS — I10 Essential (primary) hypertension: Secondary | ICD-10-CM | POA: Diagnosis not present

## 2019-06-25 DIAGNOSIS — E7800 Pure hypercholesterolemia, unspecified: Secondary | ICD-10-CM | POA: Diagnosis not present

## 2019-06-25 DIAGNOSIS — E039 Hypothyroidism, unspecified: Secondary | ICD-10-CM | POA: Diagnosis not present

## 2019-06-25 DIAGNOSIS — D531 Other megaloblastic anemias, not elsewhere classified: Secondary | ICD-10-CM | POA: Diagnosis not present

## 2019-06-25 DIAGNOSIS — E559 Vitamin D deficiency, unspecified: Secondary | ICD-10-CM | POA: Diagnosis not present

## 2019-06-26 LAB — CBC WITH DIFFERENTIAL/PLATELET
Basophils Absolute: 0.1 10*3/uL (ref 0.0–0.2)
Basos: 1 %
EOS (ABSOLUTE): 0.3 10*3/uL (ref 0.0–0.4)
Eos: 4 %
Hematocrit: 39.4 % (ref 34.0–46.6)
Hemoglobin: 12.8 g/dL (ref 11.1–15.9)
Immature Grans (Abs): 0 10*3/uL (ref 0.0–0.1)
Immature Granulocytes: 0 %
Lymphocytes Absolute: 1.9 10*3/uL (ref 0.7–3.1)
Lymphs: 26 %
MCH: 25.2 pg — ABNORMAL LOW (ref 26.6–33.0)
MCHC: 32.5 g/dL (ref 31.5–35.7)
MCV: 78 fL — ABNORMAL LOW (ref 79–97)
Monocytes Absolute: 0.4 10*3/uL (ref 0.1–0.9)
Monocytes: 6 %
Neutrophils Absolute: 4.5 10*3/uL (ref 1.4–7.0)
Neutrophils: 63 %
Platelets: 338 10*3/uL (ref 150–450)
RBC: 5.07 x10E6/uL (ref 3.77–5.28)
RDW: 13.8 % (ref 11.7–15.4)
WBC: 7.2 10*3/uL (ref 3.4–10.8)

## 2019-06-26 LAB — VITAMIN D 25 HYDROXY (VIT D DEFICIENCY, FRACTURES): Vit D, 25-Hydroxy: 37.9 ng/mL (ref 30.0–100.0)

## 2019-06-26 LAB — COMPREHENSIVE METABOLIC PANEL
ALT: 9 IU/L (ref 0–32)
AST: 12 IU/L (ref 0–40)
Albumin/Globulin Ratio: 1.6 (ref 1.2–2.2)
Albumin: 4.2 g/dL (ref 3.7–4.7)
Alkaline Phosphatase: 112 IU/L (ref 39–117)
BUN/Creatinine Ratio: 15 (ref 12–28)
BUN: 10 mg/dL (ref 8–27)
Bilirubin Total: 0.5 mg/dL (ref 0.0–1.2)
CO2: 25 mmol/L (ref 20–29)
Calcium: 9.9 mg/dL (ref 8.7–10.3)
Chloride: 102 mmol/L (ref 96–106)
Creatinine, Ser: 0.65 mg/dL (ref 0.57–1.00)
GFR calc Af Amer: 102 mL/min/{1.73_m2} (ref >59)
GFR calc non Af Amer: 88 mL/min/{1.73_m2} (ref >59)
Globulin, Total: 2.7 g/dL (ref 1.5–4.5)
Glucose: 105 mg/dL — ABNORMAL HIGH (ref 65–99)
Potassium: 4.1 mmol/L (ref 3.5–5.2)
Sodium: 141 mmol/L (ref 134–144)
Total Protein: 6.9 g/dL (ref 6.0–8.5)

## 2019-06-26 LAB — HEMOGLOBIN A1C
Est. average glucose Bld gHb Est-mCnc: 131 mg/dL
Hgb A1c MFr Bld: 6.2 % — ABNORMAL HIGH (ref 4.8–5.6)

## 2019-06-26 LAB — LIPID PANEL
Chol/HDL Ratio: 4.1 ratio (ref 0.0–4.4)
Cholesterol, Total: 182 mg/dL (ref 100–199)
HDL: 44 mg/dL (ref >39)
LDL Chol Calc (NIH): 118 mg/dL — ABNORMAL HIGH (ref 0–99)
Triglycerides: 110 mg/dL (ref 0–149)
VLDL Cholesterol Cal: 20 mg/dL (ref 5–40)

## 2019-06-26 LAB — TSH: TSH: 0.279 u[IU]/mL — ABNORMAL LOW (ref 0.450–4.500)

## 2019-06-26 LAB — VITAMIN B12: Vitamin B-12: 169 pg/mL — ABNORMAL LOW (ref 232–1245)

## 2019-06-27 ENCOUNTER — Telehealth: Payer: Self-pay

## 2019-06-27 DIAGNOSIS — E039 Hypothyroidism, unspecified: Secondary | ICD-10-CM

## 2019-06-27 MED ORDER — LEVOTHYROXINE SODIUM 112 MCG PO TABS: 112.0000 ug | ORAL_TABLET | Freq: Every day | ORAL | 3 refills | Status: DC

## 2019-06-27 NOTE — Telephone Encounter (Signed)
Patient advised. B12 injection appointment scheduled.

## 2019-06-27 NOTE — Telephone Encounter (Signed)
Resume B12 injections monthly for 3 months and then q3 months.  Go ahead and send new synthroid Rx as below.

## 2019-06-27 NOTE — Telephone Encounter (Signed)
-----   Message from Virginia Crews, MD sent at 06/27/2019  1:22 PM EDT ----- Normal Vit D, Blood counts, kidney function, liver function, electrolytes. A1c is stable in prediabetic range. Encourage low carb diet. Cholesterol is stable.  I recommend diet low in saturated fat and regular exercise - 30 min at least 5 times per week.  TSH is low.  Need to decrease synthroid dose slightly. Can send e-rx for synthroid 112 mcg daily #30 r2 and plan to recheck TSH in 8 weeks. Vit B12 is also low.  Taking supplement?

## 2019-06-27 NOTE — Telephone Encounter (Signed)
Patient was advised of labs. Patient states that she is fine with changing her Synthroid. Patient states that she is taking her B-12 supplement and she use to get B-12 inject every three months but stopped in January since [facility].Please advise.

## 2019-07-08 ENCOUNTER — Other Ambulatory Visit: Payer: Self-pay

## 2019-07-08 ENCOUNTER — Ambulatory Visit (INDEPENDENT_AMBULATORY_CARE_PROVIDER_SITE_OTHER): Payer: Medicare Other

## 2019-07-08 DIAGNOSIS — D531 Other megaloblastic anemias, not elsewhere classified: Secondary | ICD-10-CM | POA: Diagnosis not present

## 2019-07-08 MED ORDER — CYANOCOBALAMIN 1000 MCG/ML IJ SOLN
1000.0000 ug | Freq: Once | INTRAMUSCULAR | Status: AC
  Administered 2019-07-08: 1000 ug via INTRAMUSCULAR

## 2019-07-15 ENCOUNTER — Encounter: Payer: Self-pay | Admitting: *Deleted

## 2019-07-23 ENCOUNTER — Other Ambulatory Visit: Payer: Self-pay | Admitting: Family Medicine

## 2019-07-23 NOTE — Telephone Encounter (Signed)
[facility] faxed refill request for the following medications:  levothyroxine (SYNTHROID) 125 MCG tablet  It looks like a prescription was supposed to be sent in for Synthroid 169mcg according to the phone message on 06/27/2019.  Please advise.

## 2019-07-23 NOTE — Telephone Encounter (Signed)
Pharmacy advised to cancel refill.

## 2019-07-30 ENCOUNTER — Telehealth: Payer: Self-pay | Admitting: Gastroenterology

## 2019-07-30 ENCOUNTER — Other Ambulatory Visit: Payer: Self-pay | Admitting: Family Medicine

## 2019-07-30 MED ORDER — LISINOPRIL 10 MG PO TABS: 10.0000 mg | ORAL_TABLET | Freq: Every day | ORAL | 0 refills | Status: DC

## 2019-07-30 MED ORDER — HYDROCHLOROTHIAZIDE 12.5 MG PO TABS: 12.5000 mg | ORAL_TABLET | Freq: Every day | ORAL | 0 refills | Status: DC

## 2019-07-30 NOTE — Telephone Encounter (Signed)
[facility] faxed refill request for the following medications:  1. lisinopril (ZESTRIL) 10 MG tablet 2. hydrochlorothiazide (HYDRODIURIL) 12.5 MG tablet   90 day supply Last Rx: 04/24/2019 LOV: 06/16/2019 Please advise. Thanks TNP

## 2019-07-30 NOTE — Telephone Encounter (Signed)
Patient received letter to schedule colonoscopy.

## 2019-07-30 NOTE — Telephone Encounter (Signed)
Received call from patient, she states her husband is going through some medical issues right now.  She would like to call us back in January to schedule her 3 year repeat with Dr. Allen Norris.  I told her this would be fine.  Thanks [employer]

## 2019-08-12 ENCOUNTER — Other Ambulatory Visit: Payer: Self-pay

## 2019-08-12 ENCOUNTER — Ambulatory Visit (INDEPENDENT_AMBULATORY_CARE_PROVIDER_SITE_OTHER): Payer: Medicare Other

## 2019-08-12 VITALS — Temp 97.3°F

## 2019-08-12 DIAGNOSIS — D531 Other megaloblastic anemias, not elsewhere classified: Secondary | ICD-10-CM

## 2019-08-12 MED ORDER — CYANOCOBALAMIN 1000 MCG/ML IJ SOLN
1000.0000 ug | Freq: Once | INTRAMUSCULAR | Status: AC
  Administered 2019-08-12: 1000 ug via INTRAMUSCULAR

## 2019-09-08 ENCOUNTER — Other Ambulatory Visit: Payer: Self-pay

## 2019-09-08 DIAGNOSIS — Z20822 Contact with and (suspected) exposure to covid-19: Secondary | ICD-10-CM

## 2019-09-10 LAB — NOVEL CORONAVIRUS, NAA: SARS-CoV-2, NAA: NOT DETECTED

## 2019-09-11 ENCOUNTER — Telehealth: Payer: Self-pay | Admitting: Family Medicine

## 2019-09-11 NOTE — Telephone Encounter (Signed)
Negative COVID results given. Patient results "NOT Detected." Caller expressed understanding. ° °

## 2019-09-16 ENCOUNTER — Other Ambulatory Visit: Payer: Self-pay

## 2019-09-16 ENCOUNTER — Ambulatory Visit (INDEPENDENT_AMBULATORY_CARE_PROVIDER_SITE_OTHER): Payer: Medicare Other

## 2019-09-16 DIAGNOSIS — E538 Deficiency of other specified B group vitamins: Secondary | ICD-10-CM | POA: Diagnosis not present

## 2019-09-16 DIAGNOSIS — D531 Other megaloblastic anemias, not elsewhere classified: Secondary | ICD-10-CM

## 2019-09-16 MED ORDER — CYANOCOBALAMIN 1000 MCG/ML IJ SOLN
1000.0000 ug | Freq: Once | INTRAMUSCULAR | Status: AC
  Administered 2019-09-16: 1000 ug via INTRAMUSCULAR

## 2019-09-16 NOTE — Progress Notes (Signed)
Patient: Michaela Weber Female    DOB: 12-Jul-1946   73 y.o.   MRN: [id number] Visit Date: 09/16/2019  Today's Provider: [facility] NURSE   Chief Complaint  Patient presents with  . B12 Injection   Subjective:     HPI  Allergies  Allergen Reactions  . Codeine Nausea And Vomiting     Current Outpatient Medications:  .  aspirin 81 MG EC tablet, Take 81 mg by mouth daily.  , Disp: , Rfl:  .  Cyanocobalamin (B-12 COMPLIANCE INJECTION) 1000 MCG/ML KIT, Inject 1 mL as directed every 30 (thirty) days. (Patient taking differently: Inject 1 mL as directed. ), Disp: 1 kit, Rfl: 0 .  hydrochlorothiazide (HYDRODIURIL) 12.5 MG tablet, Take 1 tablet (12.5 mg total) by mouth daily., Disp: 90 tablet, Rfl: 0 .  levothyroxine (SYNTHROID) 112 MCG tablet, Take 1 tablet (112 mcg total) by mouth daily., Disp: 90 tablet, Rfl: 3 .  levothyroxine (SYNTHROID) 125 MCG tablet, Take 1 tablet (125 mcg total) by mouth daily before breakfast., Disp: 90 tablet, Rfl: 0 .  lisinopril (ZESTRIL) 10 MG tablet, Take 1 tablet (10 mg total) by mouth daily., Disp: 90 tablet, Rfl: 0 .  lisinopril-hydrochlorothiazide (ZESTORETIC) 10-12.5 MG tablet, TAKE 1 TABLET BY MOUTH EVERY DAY, Disp: 90 tablet, Rfl: 0 .  VITAMIN D, CHOLECALCIFEROL, PO, Take 2,000 Units by mouth daily. , Disp: , Rfl:   Review of Systems  Social History   Tobacco Use  . Smoking status: Never Smoker  . Smokeless tobacco: Never Used  . Tobacco comment: tobacco use - no  Substance Use Topics  . Alcohol use: No      Objective:   There were no vitals taken for this visit. There were no vitals filed for this visit.There is no height or weight on file to calculate BMI.   Physical Exam   No results found for any visits on 09/16/19.     Assessment & Plan        [facility]

## 2019-10-07 ENCOUNTER — Other Ambulatory Visit: Payer: Self-pay

## 2019-10-07 ENCOUNTER — Ambulatory Visit (INDEPENDENT_AMBULATORY_CARE_PROVIDER_SITE_OTHER): Payer: Medicare Other

## 2019-10-07 DIAGNOSIS — E538 Deficiency of other specified B group vitamins: Secondary | ICD-10-CM | POA: Diagnosis not present

## 2019-10-07 MED ORDER — CYANOCOBALAMIN 1000 MCG/ML IJ SOLN
1000.0000 ug | Freq: Once | INTRAMUSCULAR | Status: AC
  Administered 2019-10-07: 1000 ug via INTRAMUSCULAR

## 2019-10-07 NOTE — Progress Notes (Signed)
Patient: Michaela Weber Female    DOB: March 14, 1946   74 y.o.   MRN: [id number] Visit Date: 10/07/2019  Today's Provider: [facility] NURSE   Chief Complaint  Patient presents with  . B12 Injection   Subjective:     HPI  Allergies  Allergen Reactions  . Codeine Nausea And Vomiting     Current Outpatient Medications:  .  aspirin 81 MG EC tablet, Take 81 mg by mouth daily.  , Disp: , Rfl:  .  Cyanocobalamin (B-12 COMPLIANCE INJECTION) 1000 MCG/ML KIT, Inject 1 mL as directed every 30 (thirty) days. (Patient taking differently: Inject 1 mL as directed. ), Disp: 1 kit, Rfl: 0 .  hydrochlorothiazide (HYDRODIURIL) 12.5 MG tablet, Take 1 tablet (12.5 mg total) by mouth daily., Disp: 90 tablet, Rfl: 0 .  levothyroxine (SYNTHROID) 112 MCG tablet, Take 1 tablet (112 mcg total) by mouth daily., Disp: 90 tablet, Rfl: 3 .  levothyroxine (SYNTHROID) 125 MCG tablet, Take 1 tablet (125 mcg total) by mouth daily before breakfast., Disp: 90 tablet, Rfl: 0 .  lisinopril (ZESTRIL) 10 MG tablet, Take 1 tablet (10 mg total) by mouth daily., Disp: 90 tablet, Rfl: 0 .  lisinopril-hydrochlorothiazide (ZESTORETIC) 10-12.5 MG tablet, TAKE 1 TABLET BY MOUTH EVERY DAY, Disp: 90 tablet, Rfl: 0 .  VITAMIN D, CHOLECALCIFEROL, PO, Take 2,000 Units by mouth daily. , Disp: , Rfl:   Review of Systems  Social History   Tobacco Use  . Smoking status: Never Smoker  . Smokeless tobacco: Never Used  . Tobacco comment: tobacco use - no  Substance Use Topics  . Alcohol use: No      Objective:   There were no vitals taken for this visit. There were no vitals filed for this visit.There is no height or weight on file to calculate BMI.   Physical Exam   No results found for any visits on 10/07/19.     Assessment & Plan        [facility]

## 2019-10-21 ENCOUNTER — Other Ambulatory Visit: Payer: Self-pay | Admitting: Family Medicine

## 2019-10-21 DIAGNOSIS — I10 Essential (primary) hypertension: Secondary | ICD-10-CM

## 2019-10-21 MED ORDER — LISINOPRIL-HYDROCHLOROTHIAZIDE 10-12.5 MG PO TABS: 1.0000 | ORAL_TABLET | Freq: Every day | ORAL | 1 refills | Status: DC

## 2019-10-21 NOTE — Telephone Encounter (Signed)
[facility] faxed refill request for the following medications:  lisinopril-hydrochlorothiazide (ZESTORETIC) 10-12.5 MG tablet   Please advise.

## 2019-10-30 DIAGNOSIS — E039 Hypothyroidism, unspecified: Secondary | ICD-10-CM | POA: Diagnosis not present

## 2019-10-31 ENCOUNTER — Telehealth: Payer: Self-pay

## 2019-10-31 LAB — TSH: TSH: 0.975 u[IU]/mL (ref 0.450–4.500)

## 2019-10-31 NOTE — Telephone Encounter (Signed)
Pt advised.   Thanks,   -Juanetta Negash  

## 2019-10-31 NOTE — Telephone Encounter (Signed)
-----   Message from Virginia Crews, MD sent at 10/31/2019 10:15 AM EST ----- Normal labs

## 2019-11-04 ENCOUNTER — Other Ambulatory Visit: Payer: Self-pay | Admitting: Family Medicine

## 2019-11-04 MED ORDER — LISINOPRIL 10 MG PO TABS: 10.0000 mg | ORAL_TABLET | Freq: Every day | ORAL | 0 refills | Status: DC

## 2019-11-04 MED ORDER — HYDROCHLOROTHIAZIDE 12.5 MG PO TABS: 12.5000 mg | ORAL_TABLET | Freq: Every day | ORAL | 0 refills | Status: DC

## 2019-11-04 NOTE — Telephone Encounter (Signed)
Patients las office visit to address HTN was 48months ago , she does have a follow up appt scheduled in March. KW

## 2019-11-04 NOTE — Telephone Encounter (Signed)
[facility] faxed refill request for the following medications:   lisinopril (ZESTRIL) 10 MG tablet   Please advise.  

## 2019-11-04 NOTE — Telephone Encounter (Signed)
[facility] faxed refill request for the following medications:  hydrochlorothiazide (HYDRODIURIL) 12.5 MG tablet 90 day supply Last Rx: 07/30/2019 LOV: 06/16/2019 Please advise. Thanks TNP

## 2019-11-11 ENCOUNTER — Ambulatory Visit (INDEPENDENT_AMBULATORY_CARE_PROVIDER_SITE_OTHER): Payer: Medicare Other

## 2019-11-11 ENCOUNTER — Other Ambulatory Visit: Payer: Self-pay

## 2019-11-11 VITALS — Temp 97.2°F

## 2019-11-11 DIAGNOSIS — E538 Deficiency of other specified B group vitamins: Secondary | ICD-10-CM

## 2019-11-11 MED ORDER — CYANOCOBALAMIN 1000 MCG/ML IJ SOLN
1000.0000 ug | Freq: Once | INTRAMUSCULAR | Status: AC
  Administered 2019-11-11: 1000 ug via INTRAMUSCULAR

## 2019-12-09 ENCOUNTER — Ambulatory Visit: Payer: Self-pay

## 2019-12-15 ENCOUNTER — Ambulatory Visit: Payer: Self-pay | Admitting: Family Medicine

## 2019-12-15 NOTE — Progress Notes (Deleted)
Patient: Michaela Weber Female    DOB: May 18, 1946   74 y.o.   MRN: [id number] Visit Date: 12/15/2019  Today's Provider: Lavon Paganini, MD   No chief complaint on file.  Subjective:     HPI  Prediabetes, Follow-up:   Lab Results  Component Value Date   HGBA1C 6.2 (H) 06/25/2019   HGBA1C 6.0 (H) 07/22/2018   HGBA1C 6.2 (H) 01/23/2018   GLUCOSE 105 (H) 06/25/2019   GLUCOSE 98 07/22/2018   GLUCOSE 106 (H) 01/23/2018    Last seen for for this6 months ago.  Management since that visit includes check lab. Current symptoms include {Symptoms; diabetes:14075} and have been {Desc; course:15616}.  Weight trend: {trend:16658} Prior visit with dietician: no Current diet: {diet habits:16563} Current exercise: {exercise types:16438}  Pertinent Labs:    Component Value Date/Time   CHOL 182 06/25/2019 0832   TRIG 110 06/25/2019 0832   CHOLHDL 4.1 06/25/2019 0832   CREATININE 0.65 06/25/2019 0832   CREATININE 0.63 07/19/2017 0903    Wt Readings from Last 3 Encounters:  06/16/19 142 lb 3.2 oz (64.5 kg)  07/19/18 147 lb 9.6 oz (67 kg)  01/17/18 140 lb (63.5 kg)    Hypertension, follow-up:  BP Readings from Last 3 Encounters:  06/16/19 134/78  07/19/18 135/80  01/17/18 116/72    She was last seen for hypertension 6 months ago.  BP at that visit was 134/78. Management changes since that visit include no changes. She reports excellent compliance with treatment. She is not having side effects.  She {is/is not:9024} exercising. She {is/is not:9024} adherent to low salt diet.   Outside blood pressures are ***. She is experiencing {Symptoms; cardiac:12860}.  Patient denies {Symptoms; cardiac:12860}.   Cardiovascular risk factors include {cv risk factors:510}.  Use of agents associated with hypertension: {bp agents assoc with hypertension:511::"none"}.     Weight trend: {trend:16658} Wt Readings from Last 3 Encounters:  06/16/19 142 lb 3.2 oz (64.5 kg)    07/19/18 147 lb 9.6 oz (67 kg)  01/17/18 140 lb (63.5 kg)    Current diet: {diet habits:16563}  ------------------------------------------------------------------------   Hypothyroid, follow-up:  TSH  Date Value Ref Range Status  10/30/2019 0.975 0.450 - 4.500 uIU/mL Final  06/25/2019 0.279 (L) 0.450 - 4.500 uIU/mL Final  07/22/2018 0.583 0.450 - 4.500 uIU/mL Final   Wt Readings from Last 3 Encounters:  06/16/19 142 lb 3.2 oz (64.5 kg)  07/19/18 147 lb 9.6 oz (67 kg)  01/17/18 140 lb (63.5 kg)    She was last seen for hypothyroid 6 months ago. Management since that visit includes checking lab. Patient's TSH was low. Patient to decrease Synthroid dose to 112 mcg daily. TSH rechecked on 10/30/2019 and was normal. She reports {excellent/good/fair/poor:19665} compliance with treatment. She {ACTION; IS/IS PVI:[id number]} having side effects.  She {is/is not:9024} exercising. She is experiencing {Symptoms; thyroid:13835} She denies {Symptoms; thyroid:13835} Weight trend: {trend:16658}  ------------------------------------------------------------------------  Follow up for Vitamin B12 deficiency  The patient was last seen for this 6 months ago. Changes made at last visit include check labs. Vitamin B12 was low. Patient's last B12 injection was on 11/11/2019. Patient no showed on 12/09/2019 for injection.  She reports {excellent/good/fair/poor:19665} compliance with treatment. She feels that condition is {improved/worse/unchanged:3041574}. She {ACTION; IS/IS CVG:86885207} having side effects. ***  ------------------------------------------------------------------------------------    Allergies  Allergen Reactions  . Codeine Nausea And Vomiting     Current Outpatient Medications:  .  aspirin 81 MG EC tablet, Take 81 mg  by mouth daily.  , Disp: , Rfl:  .  Cyanocobalamin (B-12 COMPLIANCE INJECTION) 1000 MCG/ML KIT, Inject 1 mL as directed every 30 (thirty) days. (Patient  taking differently: Inject 1 mL as directed. ), Disp: 1 kit, Rfl: 0 .  hydrochlorothiazide (HYDRODIURIL) 12.5 MG tablet, Take 1 tablet (12.5 mg total) by mouth daily., Disp: 90 tablet, Rfl: 0 .  levothyroxine (SYNTHROID) 112 MCG tablet, Take 1 tablet (112 mcg total) by mouth daily., Disp: 90 tablet, Rfl: 3 .  levothyroxine (SYNTHROID) 125 MCG tablet, Take 1 tablet (125 mcg total) by mouth daily before breakfast., Disp: 90 tablet, Rfl: 0 .  lisinopril (ZESTRIL) 10 MG tablet, Take 1 tablet (10 mg total) by mouth daily., Disp: 90 tablet, Rfl: 0 .  lisinopril-hydrochlorothiazide (ZESTORETIC) 10-12.5 MG tablet, Take 1 tablet by mouth daily., Disp: 90 tablet, Rfl: 1 .  VITAMIN D, CHOLECALCIFEROL, PO, Take 2,000 Units by mouth daily. , Disp: , Rfl:   Review of Systems  Social History   Tobacco Use  . Smoking status: Never Smoker  . Smokeless tobacco: Never Used  . Tobacco comment: tobacco use - no  Substance Use Topics  . Alcohol use: No      Objective:   There were no vitals taken for this visit. There were no vitals filed for this visit.There is no height or weight on file to calculate BMI.   Physical Exam   No results found for any visits on 12/15/19.     Assessment & Plan        Lavon Paganini, MD  [facility]

## 2020-01-06 ENCOUNTER — Ambulatory Visit (INDEPENDENT_AMBULATORY_CARE_PROVIDER_SITE_OTHER): Payer: Medicare Other

## 2020-01-06 ENCOUNTER — Other Ambulatory Visit: Payer: Self-pay

## 2020-01-06 DIAGNOSIS — E538 Deficiency of other specified B group vitamins: Secondary | ICD-10-CM | POA: Diagnosis not present

## 2020-01-06 MED ORDER — CYANOCOBALAMIN 1000 MCG/ML IJ SOLN
1000.0000 ug | Freq: Once | INTRAMUSCULAR | Status: AC
  Administered 2020-01-06: 1000 ug via INTRAMUSCULAR

## 2020-01-22 ENCOUNTER — Telehealth: Payer: Self-pay | Admitting: Family Medicine

## 2020-01-22 NOTE — Telephone Encounter (Signed)
Left message for patient to schedule Annual Wellness Visit.  Please schedule with Nurse Health Advisor Victoria Britt, RN at [facility]  

## 2020-02-05 ENCOUNTER — Encounter: Payer: Self-pay | Admitting: Family Medicine

## 2020-02-05 ENCOUNTER — Other Ambulatory Visit: Payer: Self-pay

## 2020-02-05 ENCOUNTER — Ambulatory Visit (INDEPENDENT_AMBULATORY_CARE_PROVIDER_SITE_OTHER): Payer: Medicare Other | Admitting: Family Medicine

## 2020-02-05 VITALS — BP 94/64 | HR 101 | Temp 97.3°F | Resp 16 | Ht 62.0 in | Wt 138.0 lb

## 2020-02-05 DIAGNOSIS — E039 Hypothyroidism, unspecified: Secondary | ICD-10-CM

## 2020-02-05 DIAGNOSIS — D531 Other megaloblastic anemias, not elsewhere classified: Secondary | ICD-10-CM

## 2020-02-05 DIAGNOSIS — I10 Essential (primary) hypertension: Secondary | ICD-10-CM | POA: Diagnosis not present

## 2020-02-05 DIAGNOSIS — R7303 Prediabetes: Secondary | ICD-10-CM | POA: Diagnosis not present

## 2020-02-05 MED ORDER — CYANOCOBALAMIN 1000 MCG/ML IJ SOLN
1000.0000 ug | Freq: Once | INTRAMUSCULAR | Status: AC
  Administered 2020-02-05: 1000 ug via INTRAMUSCULAR

## 2020-02-05 MED ORDER — LISINOPRIL-HYDROCHLOROTHIAZIDE 10-12.5 MG PO TABS: 1.0000 | ORAL_TABLET | Freq: Every day | ORAL | 1 refills | Status: DC

## 2020-02-05 NOTE — Assessment & Plan Note (Addendum)
B12 injection today Patient tolerated injection well Recheck B 12 prior to injection

## 2020-02-05 NOTE — Progress Notes (Signed)
Established patient visit   Patient: Michaela Weber   DOB: 07/18/1946   74 y.o. Female  MRN: [id number] Visit Date: 02/05/2020  I,Sulibeya S Dimas,acting as a scribe for Lavon Paganini, MD.,have documented all relevant documentation on the behalf of Lavon Paganini, MD,as directed by  Lavon Paganini, MD while in the presence of Lavon Paganini, MD.  Today's healthcare provider: Lavon Paganini, MD   Chief Complaint  Patient presents with  . Hypertension  . Hyperlipidemia  . Hypothyroidism  . Prediabetes   Subjective    HPI Prediabetes, Follow-up  Lab Results  Component Value Date   HGBA1C 6.2 (H) 06/25/2019   HGBA1C 6.0 (H) 07/22/2018   HGBA1C 6.2 (H) 01/23/2018   GLUCOSE 105 (H) 06/25/2019   GLUCOSE 98 07/22/2018   GLUCOSE 106 (H) 01/23/2018    Last seen for for this6 months ago.  Management since that visit includes check lab. Current symptoms include none and have been stable.  Prior visit with dietician: no Current diet: in general, a "healthy" diet   Current exercise: housecleaning, walking and yard work  Pertinent Labs:    Component Value Date/Time   CHOL 182 06/25/2019 0832   TRIG 110 06/25/2019 0832   CHOLHDL 4.1 06/25/2019 0832   CREATININE 0.65 06/25/2019 0832   CREATININE 0.63 07/19/2017 0903    Wt Readings from Last 3 Encounters:  02/05/20 138 lb (62.6 kg)  06/16/19 142 lb 3.2 oz (64.5 kg)  07/19/18 147 lb 9.6 oz (67 kg)    -----------------------------------------------------------------------------------------  Hypertension, follow-up  BP Readings from Last 3 Encounters:  02/05/20 94/64  06/16/19 134/78  07/19/18 135/80   Wt Readings from Last 3 Encounters:  02/05/20 138 lb (62.6 kg)  06/16/19 142 lb 3.2 oz (64.5 kg)  07/19/18 147 lb 9.6 oz (67 kg)     She was last seen for hypertension 6 months ago.  BP at that visit was 134/78. Management since that visit includes check labs.  She reports excellent  compliance with treatment. She is not having side effects.  She is following a Regular diet. She is exercising. She does not smoke.  Use of agents associated with hypertension: none.   Outside blood pressures are not being checked. Symptoms: No chest pain No chest pressure  No palpitations No syncope  No dyspnea No orthopnea  No paroxysmal nocturnal dyspnea No lower extremity edema   Pertinent labs: Lab Results  Component Value Date   CHOL 182 06/25/2019   HDL 44 06/25/2019   LDLCALC 118 (H) 06/25/2019   TRIG 110 06/25/2019   CHOLHDL 4.1 06/25/2019   Lab Results  Component Value Date   NA 141 06/25/2019   K 4.1 06/25/2019   CREATININE 0.65 06/25/2019   GFRNONAA 88 06/25/2019   GFRAA 102 06/25/2019   GLUCOSE 105 (H) 06/25/2019     The 10-year ASCVD risk score Mikey Bussing DC Jr., et al., 2013) is: 9.6%   --------------------------------------------------------------------------------------------------- Lipid/Cholesterol, Follow-up  Last lipid panel Other pertinent labs  Lab Results  Component Value Date   CHOL 182 06/25/2019   HDL 44 06/25/2019   LDLCALC 118 (H) 06/25/2019   TRIG 110 06/25/2019   CHOLHDL 4.1 06/25/2019   Lab Results  Component Value Date   ALT 9 06/25/2019   AST 12 06/25/2019   PLT 338 06/25/2019   TSH 0.975 10/30/2019     She was last seen for this 6 months ago.  Management since that visit includes no changes.  She reports excellent  compliance with treatment. She is not having side effects.   Symptoms: No chest pain No chest pressure/discomfort  No dyspnea No lower extremity edema  No numbness or tingling of extremity No orthopnea  No palpitations No paroxysmal nocturnal dyspnea  No speech difficulty No syncope   Current diet: in general, a "healthy" diet   Current exercise: gardening, housecleaning, walking and yard work  The 10-year ASCVD risk score Mikey Bussing DC Jr., et al., 2013) is:  9.6%  --------------------------------------------------------------------------------------------------- Hypothyroid, follow-up  Lab Results  Component Value Date   TSH 0.975 10/30/2019   TSH 0.279 (L) 06/25/2019   TSH 0.583 07/22/2018   T4TOTAL 8.7 01/18/2017   Wt Readings from Last 3 Encounters:  02/05/20 138 lb (62.6 kg)  06/16/19 142 lb 3.2 oz (64.5 kg)  07/19/18 147 lb 9.6 oz (67 kg)    She was last seen for hypothyroid 6 months ago.  Management since that visit includes synthroid 138mg daily. She reports excellent compliance with treatment. She is not having side effects.   Symptoms: No change in energy level No constipation No diarrhea No heat / cold intolerance No nervousness No palpitations No weight changes  -----------------------------------------------------------------------------------------  Patient's husband passed away about 4 to 5 months ago.  States she has good and bad days.  She has a very supportive family and support system.  She feels as though she is going through normal grieving process.   Patient Active Problem List   Diagnosis Date Noted  . Overweight 06/16/2019  . Osteopenia 01/17/2018  . History of adenomatous polyp of colon   . Allergic rhinitis 02/16/2016  . CD (contact dermatitis) 02/16/2016  . Dyshidrotic eczema 02/16/2016  . Fatigue 02/16/2016  . Acid reflux 02/16/2016  . Pre-diabetes 02/16/2016  . Vitamin D deficiency 07/14/2015  . Essential hypertension 07/14/2015  . HLD (hyperlipidemia) 07/14/2015  . MURMUR 06/16/2010  . Disorder of mitral valve 05/15/2007  . Megaloblastic anemia due to B12 deficiency 04/19/2006  . Adult hypothyroidism 02/06/2006   Social History   Tobacco Use  . Smoking status: Never Smoker  . Smokeless tobacco: Never Used  . Tobacco comment: tobacco use - no  Substance Use Topics  . Alcohol use: No  . Drug use: No       Medications: Outpatient Medications Prior to Visit  Medication Sig   . aspirin 81 MG EC tablet Take 81 mg by mouth daily.    . Cyanocobalamin (B-12 COMPLIANCE INJECTION) 1000 MCG/ML KIT Inject 1 mL as directed every 30 (thirty) days. (Patient taking differently: Inject 1 mL as directed. )  . levothyroxine (SYNTHROID) 112 MCG tablet Take 1 tablet (112 mcg total) by mouth daily.  .Marland KitchenVITAMIN D, CHOLECALCIFEROL, PO Take 2,000 Units by mouth daily.   . [DISCONTINUED] hydrochlorothiazide (HYDRODIURIL) 12.5 MG tablet Take 1 tablet (12.5 mg total) by mouth daily.  . [DISCONTINUED] lisinopril (ZESTRIL) 10 MG tablet Take 1 tablet (10 mg total) by mouth daily.  . [DISCONTINUED] levothyroxine (SYNTHROID) 125 MCG tablet Take 1 tablet (125 mcg total) by mouth daily before breakfast. (Patient not taking: Reported on 02/05/2020)  . [DISCONTINUED] lisinopril-hydrochlorothiazide (ZESTORETIC) 10-12.5 MG tablet Take 1 tablet by mouth daily. (Patient not taking: Reported on 02/05/2020)   No facility-administered medications prior to visit.    Review of Systems  Constitutional: Negative.   Respiratory: Negative.   Cardiovascular: Negative.   Endocrine: Negative.   Musculoskeletal: Negative.   Neurological: Negative.   Psychiatric/Behavioral: Negative.     Objective    BP 94/64 (  BP Location: Left Arm, Patient Position: Sitting, Cuff Size: Normal)   Pulse (!) 101   Temp (!) 97.3 F (36.3 C) (Temporal)   Resp 16   Ht '5\' 2"'$  (1.575 m)   Wt 138 lb (62.6 kg)   BMI 25.24 kg/m  BP Readings from Last 3 Encounters:  02/05/20 94/64  06/16/19 134/78  07/19/18 135/80   Wt Readings from Last 3 Encounters:  02/05/20 138 lb (62.6 kg)  06/16/19 142 lb 3.2 oz (64.5 kg)  07/19/18 147 lb 9.6 oz (67 kg)      Physical Exam Vitals reviewed.  Constitutional:      General: She is not in acute distress.    Appearance: Normal appearance. She is well-developed. She is not diaphoretic.  HENT:     Head: Normocephalic and atraumatic.  Eyes:     General: No scleral icterus.     Conjunctiva/sclera: Conjunctivae normal.  Neck:     Thyroid: No thyromegaly.  Cardiovascular:     Rate and Rhythm: Normal rate and regular rhythm.     Pulses: Normal pulses.     Heart sounds: Normal heart sounds. No murmur.  Pulmonary:     Effort: Pulmonary effort is normal. No respiratory distress.     Breath sounds: Normal breath sounds. No wheezing, rhonchi or rales.  Abdominal:     General: There is no distension.     Palpations: Abdomen is soft.     Tenderness: There is no abdominal tenderness.  Musculoskeletal:     Cervical back: Neck supple.     Right lower leg: No edema.     Left lower leg: No edema.  Lymphadenopathy:     Cervical: No cervical adenopathy.  Skin:    General: Skin is warm and dry.     Findings: No rash.  Neurological:     Mental Status: She is alert and oriented to person, place, and time. Mental status is at baseline.  Psychiatric:        Mood and Affect: Mood normal.        Behavior: Behavior normal.      No results found for any visits on 02/05/20.  Assessment & Plan     Problem List Items Addressed This Visit      Cardiovascular and Mediastinum   Essential hypertension - Primary    Well controlled BP is low normal, but patient is asymptomatic Continue current medications Recheck metabolic panel F/u in 6 months       Relevant Medications   lisinopril-hydrochlorothiazide (ZESTORETIC) 10-12.5 MG tablet   Other Relevant Orders   Basic metabolic panel     Endocrine   Adult hypothyroidism    Previously well controlled Continue Synthroid at current dose  Recheck TSH and adjust Synthroid as indicated        Relevant Orders   TSH     Other   Megaloblastic anemia due to B12 deficiency    B12 injection today Patient tolerated injection well Recheck B 12 prior to injection       Relevant Orders   Vitamin B12   Pre-diabetes    Discussed importance of healthy weight management Discussed diet and exercise Recheck A1c       Relevant Orders   Hemoglobin A1c      Follow up in 6 months     I, Lavon Paganini, MD, have reviewed all documentation for this visit. The documentation on 02/05/20 for the exam, diagnosis, procedures, and orders are all accurate and complete.  Ian Castagna, Dionne Bucy, MD, MPH [facility]

## 2020-02-05 NOTE — Assessment & Plan Note (Addendum)
Well controlled BP is low normal, but patient is asymptomatic Continue current medications Recheck metabolic panel F/u in 6 months

## 2020-02-05 NOTE — Assessment & Plan Note (Signed)
Discussed importance of healthy weight management Discussed diet and exercise Recheck A1c

## 2020-02-05 NOTE — Assessment & Plan Note (Signed)
Previously well controlled Continue Synthroid at current dose  Recheck TSH and adjust Synthroid as indicated   

## 2020-02-06 ENCOUNTER — Telehealth: Payer: Self-pay

## 2020-02-06 LAB — BASIC METABOLIC PANEL
BUN/Creatinine Ratio: 23 (ref 12–28)
BUN: 14 mg/dL (ref 8–27)
CO2: 29 mmol/L (ref 20–29)
Calcium: 10 mg/dL (ref 8.7–10.3)
Chloride: 99 mmol/L (ref 96–106)
Creatinine, Ser: 0.6 mg/dL (ref 0.57–1.00)
GFR calc Af Amer: 105 mL/min/{1.73_m2} (ref >59)
GFR calc non Af Amer: 91 mL/min/{1.73_m2} (ref >59)
Glucose: 107 mg/dL — ABNORMAL HIGH (ref 65–99)
Potassium: 4.5 mmol/L (ref 3.5–5.2)
Sodium: 141 mmol/L (ref 134–144)

## 2020-02-06 LAB — HEMOGLOBIN A1C
Est. average glucose Bld gHb Est-mCnc: 131 mg/dL
Hgb A1c MFr Bld: 6.2 % — ABNORMAL HIGH (ref 4.8–5.6)

## 2020-02-06 LAB — VITAMIN B12: Vitamin B-12: 274 pg/mL (ref 232–1245)

## 2020-02-06 LAB — TSH: TSH: 1.05 u[IU]/mL (ref 0.450–4.500)

## 2020-02-06 NOTE — Telephone Encounter (Signed)
Patient advised as below. Patient verbalizes understanding and is in agreement with treatment plan.  

## 2020-02-06 NOTE — Telephone Encounter (Signed)
-----   Message from Virginia Crews, MD sent at 02/06/2020 11:49 AM EDT ----- Normal/stable labs.  B12 is low normal right before getting injection, so continue monthly injections

## 2020-02-18 ENCOUNTER — Encounter: Payer: Medicare Other | Admitting: *Deleted

## 2020-03-09 ENCOUNTER — Ambulatory Visit (INDEPENDENT_AMBULATORY_CARE_PROVIDER_SITE_OTHER): Payer: Medicare Other

## 2020-03-09 ENCOUNTER — Other Ambulatory Visit: Payer: Self-pay

## 2020-03-09 ENCOUNTER — Other Ambulatory Visit: Payer: Self-pay | Admitting: Family Medicine

## 2020-03-09 VITALS — Temp 97.1°F

## 2020-03-09 DIAGNOSIS — E538 Deficiency of other specified B group vitamins: Secondary | ICD-10-CM

## 2020-03-09 DIAGNOSIS — Z1231 Encounter for screening mammogram for malignant neoplasm of breast: Secondary | ICD-10-CM

## 2020-03-09 MED ORDER — CYANOCOBALAMIN 1000 MCG/ML IJ SOLN
1000.0000 ug | Freq: Once | INTRAMUSCULAR | Status: AC
  Administered 2020-03-09: 1000 ug via INTRAMUSCULAR

## 2020-04-13 ENCOUNTER — Other Ambulatory Visit: Payer: Self-pay

## 2020-04-13 ENCOUNTER — Ambulatory Visit (INDEPENDENT_AMBULATORY_CARE_PROVIDER_SITE_OTHER): Payer: Medicare Other

## 2020-04-13 DIAGNOSIS — E538 Deficiency of other specified B group vitamins: Secondary | ICD-10-CM

## 2020-04-13 MED ORDER — CYANOCOBALAMIN 1000 MCG/ML IJ SOLN
1000.0000 ug | Freq: Once | INTRAMUSCULAR | Status: AC
Start: 2020-04-13 — End: 2020-04-13
  Administered 2020-04-13: 1000 ug via INTRAMUSCULAR

## 2020-04-16 ENCOUNTER — Ambulatory Visit
Admission: RE | Admit: 2020-04-16 | Discharge: 2020-04-16 | Disposition: A | Discharge status: Home / Self Care | Payer: Medicare Other | Source: Ambulatory Visit | Attending: Family Medicine | Admitting: Family Medicine

## 2020-04-16 DIAGNOSIS — Z1231 Encounter for screening mammogram for malignant neoplasm of breast: Secondary | ICD-10-CM | POA: Insufficient documentation

## 2020-04-23 ENCOUNTER — Telehealth: Payer: Self-pay | Admitting: Family Medicine

## 2020-04-23 NOTE — Telephone Encounter (Signed)
Pt would like someone to call her today to go over her mammogram results / please advise

## 2020-04-26 ENCOUNTER — Telehealth: Payer: Self-pay

## 2020-04-26 NOTE — Telephone Encounter (Signed)
Normal

## 2020-04-26 NOTE — Telephone Encounter (Signed)
Copied from [name] [phone]. Topic: General - Other >> Apr 26, 2020  3:10 PM Michaela Weber wrote: Reason for CRM: Pt returning Atiyas call for her Mammogram results / Pt to be called today

## 2020-04-26 NOTE — Telephone Encounter (Signed)
Called to advise patient as below, no answer LVMTCB.

## 2020-04-27 NOTE — Telephone Encounter (Addendum)
Patient was advised and states thanks for everything the office due but she was waiting 11 days to hear back from someone about her mammogram results. PAtinet states that she called in on Friday,04/23/2020 and stated the [facility] said they will send a message back to another provider to review, and I advised her that they message was send back. Then she states she called back multiple times and the told her that messages has been send back to the provider. Patient states that she did miss a call from the office and someone called her back again today because she told the [facility] that she was going to wait on hold until she was able to speak with someone in the office. Patient was advised that we are currently short on providers and the providers that are in office is currently doing their best with contacting the providers that is out patients with results. Also patient was advised that MyChart is a great app to use in situation like hers and she declined. Patient stated that she worked at [employer] and never went through anything like this before. Patient states that she understand and truly appreciate everything that the office does for her. Just a [place]

## 2020-04-27 NOTE — Telephone Encounter (Signed)
Patient advised and upset because she did not hear from her mammogram results as soon as they were resulted.

## 2020-05-11 ENCOUNTER — Other Ambulatory Visit: Payer: Self-pay

## 2020-05-11 ENCOUNTER — Ambulatory Visit (INDEPENDENT_AMBULATORY_CARE_PROVIDER_SITE_OTHER): Payer: Medicare Other

## 2020-05-11 DIAGNOSIS — E538 Deficiency of other specified B group vitamins: Secondary | ICD-10-CM

## 2020-05-11 MED ORDER — CYANOCOBALAMIN 1000 MCG/ML IJ SOLN
1000.0000 ug | Freq: Once | INTRAMUSCULAR | Status: AC
  Administered 2020-05-11: 1000 ug via INTRAMUSCULAR

## 2020-06-01 ENCOUNTER — Ambulatory Visit (INDEPENDENT_AMBULATORY_CARE_PROVIDER_SITE_OTHER): Payer: Medicare Other | Admitting: Family Medicine

## 2020-06-01 ENCOUNTER — Other Ambulatory Visit: Payer: Self-pay

## 2020-06-01 ENCOUNTER — Encounter: Payer: Self-pay | Admitting: Family Medicine

## 2020-06-01 VITALS — BP 127/79 | HR 96 | Temp 98.3°F | Wt 143.0 lb

## 2020-06-01 DIAGNOSIS — B372 Candidiasis of skin and nail: Secondary | ICD-10-CM | POA: Diagnosis not present

## 2020-06-01 MED ORDER — CLOTRIMAZOLE 1 % EX CREA: 1.0000 "application " | TOPICAL_CREAM | Freq: Two times a day (BID) | CUTANEOUS | 0 refills | Status: DC

## 2020-06-01 NOTE — Progress Notes (Signed)
I,Laura E Walsh,acting as a scribe for Lavon Paganini, MD.,have documented all relevant documentation on the behalf of Lavon Paganini, MD,as directed by  Lavon Paganini, MD while in the presence of Lavon Paganini, MD.   Established patient visit   Patient: Michaela Weber   DOB: November 27, 1945   74 y.o. Female  MRN: [id number] Visit Date: 06/01/2020  Today's healthcare provider: Lavon Paganini, MD   Chief Complaint  Patient presents with  . Rash    Started about two weeks ago.     Subjective    HPI HPI    Rash     Additional comments: Started about two weeks ago.         Last edited by Kizzie Furnish, CMA on 06/01/2020 10:45 AM. (History)      Reports red irritated rash under L breast x2 weeks.  Now spread to R side and down rib cage.  No itching.  Tried triple abx ointment without improvement.  Nevere had thi before  Social History   Tobacco Use  . Smoking status: Never Smoker  . Smokeless tobacco: Never Used  . Tobacco comment: tobacco use - no  Vaping Use  . Vaping Use: Never used  Substance Use Topics  . Alcohol use: No  . Drug use: No       Medications: Outpatient Medications Prior to Visit  Medication Sig  . aspirin 81 MG EC tablet Take 81 mg by mouth daily.    . Cyanocobalamin (B-12 COMPLIANCE INJECTION) 1000 MCG/ML KIT Inject 1 mL as directed every 30 (thirty) days. (Patient taking differently: Inject 1 mL as directed. )  . levothyroxine (SYNTHROID) 112 MCG tablet Take 1 tablet (112 mcg total) by mouth daily.  Marland Kitchen lisinopril-hydrochlorothiazide (ZESTORETIC) 10-12.5 MG tablet Take 1 tablet by mouth daily.  Marland Kitchen VITAMIN D, CHOLECALCIFEROL, PO Take 2,000 Units by mouth daily.    No facility-administered medications prior to visit.    Review of Systems  Constitutional: Negative.   Respiratory: Negative.   Cardiovascular: Negative.   Skin: Positive for color change and rash.  Neurological: Negative.        Objective    BP 127/79 (BP  Location: Left Arm, Patient Position: Sitting, Cuff Size: Normal)   Pulse 96   Temp 98.3 F (36.8 C) (Oral)   Wt 143 lb (64.9 kg)   BMI 26.16 kg/m     Physical Exam Vitals reviewed.  Constitutional:      General: She is not in acute distress.    Appearance: She is well-developed.  HENT:     Head: Normocephalic and atraumatic.  Eyes:     General: No scleral icterus.    Conjunctiva/sclera: Conjunctivae normal.  Cardiovascular:     Rate and Rhythm: Normal rate and regular rhythm.  Pulmonary:     Effort: Pulmonary effort is normal. No respiratory distress.  Skin:    General: Skin is warm and dry.     Findings: Rash (erythematous rash under L breast with satelite lesions over ribs on both sides under breasts) present.  Neurological:     Mental Status: She is alert and oriented to person, place, and time.  Psychiatric:        Behavior: Behavior normal.      No results found for any visits on 06/01/20.  Assessment & Plan     1. Candidal intertrigo - rash consistent with candidal intertrigo - discussed need to keep area clean and dry  - use barrier cream - clotrimazole BID  until resolution   Return if symptoms worsen or fail to improve.      I, Lavon Paganini, MD, have reviewed all documentation for this visit. The documentation on 06/01/20 for the exam, diagnosis, procedures, and orders are all accurate and complete.   Ivone Licht, Dionne Bucy, MD, MPH [facility]

## 2020-06-01 NOTE — Patient Instructions (Signed)
Intertrigo Intertrigo is skin irritation or inflammation (dermatitis) that occurs when folds of skin rub together. The irritation can cause a rash and make skin raw and itchy. This condition most commonly occurs in the skin folds of these areas:  Toes.  Armpits.  Groin.  Under the belly.  Under the breasts.  Buttocks. Intertrigo is not passed from person to person (is not contagious). What are the causes? This condition is caused by heat, moisture, rubbing (friction), and not enough air circulation. The condition can be made worse by:  Sweat.  Bacteria.  A fungus, such as yeast. What increases the risk? This condition is more likely to occur if you have moisture in your skin folds. You are more likely to develop this condition if you:  Have diabetes.  Are overweight.  Are not able to move around or are not active.  Live in a warm and moist climate.  Wear splints, braces, or other medical devices.  Are not able to control your bowels or bladder (have incontinence). What are the signs or symptoms? Symptoms of this condition include:  A pink or red skin rash in the skin fold or near the skin fold.  Raw or scaly skin.  Itchiness.  A burning feeling.  Bleeding.  Leaking fluid.  A bad smell. How is this diagnosed? This condition is diagnosed with a medical history and physical exam. You may also have a skin swab to test for bacteria or a fungus. How is this treated? This condition may be treated by:  Cleaning and drying your skin.  Taking an antibiotic medicine or using an antibiotic skin cream for a bacterial infection.  Using an antifungal cream on your skin or taking pills for an infection that was caused by a fungus, such as yeast.  Using a steroid ointment to relieve itchiness and irritation.  Separating the skin fold with a clean cotton cloth to absorb moisture and allow air to flow into the area. Follow these instructions at home:  Keep the  affected area clean and dry.  Do not scratch your skin.  Stay in a cool environment as much as possible. Use an air conditioner or fan, if available.  Apply over-the-counter and prescription medicines only as told by your health care provider.  If you were prescribed an antibiotic medicine, use it as told by your health care provider. Do not stop using the antibiotic even if your condition improves.  Keep all follow-up visits as told by your health care provider. This is important. How is this prevented?   Maintain a healthy weight.  Take care of your feet, especially if you have diabetes. Foot care includes: ? Wearing shoes that fit well. ? Keeping your feet dry. ? Wearing clean, breathable socks.  Protect the skin around your groin and buttocks, especially if you have incontinence. Skin protection includes: ? Following a regular cleaning routine. ? Using skin protectant creams, powders, or ointments. ? Changing protection pads frequently.  Do not wear tight clothes. Wear clothes that are loose, absorbent, and made of cotton.  Wear a bra that gives good support, if needed.  Shower and dry yourself well after activity or exercise. Use a hair dryer on a cool setting to dry between skin folds, especially after you bathe.  If you have diabetes, keep your blood sugar under control. Contact a health care provider if:  Your symptoms do not improve with treatment.  Your symptoms get worse or they spread.  You notice increased redness and   warmth.  You have a fever. Summary  Intertrigo is skin irritation or inflammation (dermatitis) that occurs when folds of skin rub together.  This condition is caused by heat, moisture, rubbing (friction), and not enough air circulation.  This condition may be treated by cleaning and drying your skin and with medicines.  Apply over-the-counter and prescription medicines only as told by your health care provider.  Keep all follow-up visits  as told by your health care provider. This is important. This information is not intended to replace advice given to you by your health care provider. Make sure you discuss any questions you have with your health care provider. Document Revised: 02/11/2018 Document Reviewed: 02/11/2018 Elsevier Patient Education  2020 Elsevier Inc.  

## 2020-06-03 ENCOUNTER — Ambulatory Visit: Payer: Medicare Other | Admitting: Family Medicine

## 2020-06-08 ENCOUNTER — Ambulatory Visit: Payer: Self-pay

## 2020-06-09 ENCOUNTER — Other Ambulatory Visit: Payer: Self-pay

## 2020-06-09 ENCOUNTER — Ambulatory Visit (INDEPENDENT_AMBULATORY_CARE_PROVIDER_SITE_OTHER): Payer: Medicare Other

## 2020-06-09 DIAGNOSIS — E538 Deficiency of other specified B group vitamins: Secondary | ICD-10-CM

## 2020-06-09 MED ORDER — CYANOCOBALAMIN 1000 MCG/ML IJ SOLN
1000.0000 ug | Freq: Once | INTRAMUSCULAR | Status: AC
  Administered 2020-06-09: 1000 ug via INTRAMUSCULAR

## 2020-06-16 ENCOUNTER — Other Ambulatory Visit: Payer: Medicare Other

## 2020-06-16 DIAGNOSIS — Z20822 Contact with and (suspected) exposure to covid-19: Secondary | ICD-10-CM | POA: Diagnosis not present

## 2020-06-17 LAB — NOVEL CORONAVIRUS, NAA: SARS-CoV-2, NAA: NOT DETECTED

## 2020-06-17 LAB — SARS-COV-2, NAA 2 DAY TAT

## 2020-06-24 ENCOUNTER — Telehealth: Payer: Self-pay | Admitting: *Deleted

## 2020-06-24 NOTE — Telephone Encounter (Signed)
Pt is schedule with the nurse health advisor at 9:40 07/20/2020  Thanks,   -Mickel Baas

## 2020-06-24 NOTE — Telephone Encounter (Signed)
Copied from [place] [id number]. Topic: Appointment Scheduling - Scheduling Inquiry for Clinic >> Jun 24, 2020  8:55 AM Oneta Rack wrote: Patient 07/07/2020 appointment with PCP had to be [facility] due to the provider. Patient would like her awv done on the same day and would like a follow up call.

## 2020-06-30 ENCOUNTER — Other Ambulatory Visit: Payer: Self-pay | Admitting: Family Medicine

## 2020-06-30 DIAGNOSIS — E039 Hypothyroidism, unspecified: Secondary | ICD-10-CM

## 2020-06-30 MED ORDER — LEVOTHYROXINE SODIUM 112 MCG PO TABS: 112.0000 ug | ORAL_TABLET | Freq: Every day | ORAL | 2 refills | Status: DC

## 2020-06-30 NOTE — Telephone Encounter (Signed)
Medication Refill - Medication: levothyroxine (SYNTHROID) 112 MCG tablet [[id number]]    Preferred Pharmacy (with phone number or street name):  [id number] - [address], [address]  [place] [address]  Phone: [phone] Fax: [phone]  Hours: Not open 24 hours     Agent: Please be advised that RX refills may take up to 3 business days. We ask that you follow-up with your pharmacy.

## 2020-07-07 ENCOUNTER — Ambulatory Visit: Payer: Self-pay | Admitting: Family Medicine

## 2020-07-19 NOTE — Progress Notes (Signed)
Subjective:   Michaela Weber is a 73 y.o. female who presents for Medicare Annual (Subsequent) preventive examination.  Review of Systems    N/A  Cardiac Risk Factors include: advanced age (>77mn, >>23women);hypertension     Objective:    Today's Vitals   07/20/20 0954  BP: 128/82  Pulse: 97  Temp: 98 F (36.7 C)  TempSrc: Oral  SpO2: 98%  Weight: 143 lb (64.9 kg)  Height: '5\' 2"'  (1.575 m)  PainSc: 0-No pain   Body mass index is 26.16 kg/m.  Advanced Directives 07/20/2020 02/04/2019 01/15/2018 07/27/2017 05/16/2017 04/07/2016 02/16/2016  Does Patient Have a Medical Advance Directive? No No No No No No No  Would patient like information on creating a medical advance directive? Yes (ED - Information included in AVS) No - Patient declined Yes (MAU/Ambulatory/Procedural Areas - Information given) - Yes (ED - Information included in AVS) No - patient declined information -    Current Medications (verified) Outpatient Encounter Medications as of 07/20/2020  Medication Sig  . aspirin 81 MG EC tablet Take 81 mg by mouth daily.    . clotrimazole (CLOTRIMAZOLE ANTI-FUNGAL) 1 % cream Apply 1 application topically 2 (two) times daily.  . Cyanocobalamin (B-12 COMPLIANCE INJECTION) 1000 MCG/ML KIT Inject 1 mL as directed every 30 (thirty) days. (Patient taking differently: Inject 1 mL as directed. )  . levothyroxine (SYNTHROID) 112 MCG tablet Take 1 tablet (112 mcg total) by mouth daily.  .Marland Kitchenlisinopril-hydrochlorothiazide (ZESTORETIC) 10-12.5 MG tablet Take 1 tablet by mouth daily.  .Marland KitchenVITAMIN D, CHOLECALCIFEROL, PO Take 2,000 Units by mouth daily.    No facility-administered encounter medications on file as of 07/20/2020.    Allergies (verified) Codeine   History: Past Medical History:  Diagnosis Date  . Anemia    in past  . Diastolic dysfunction   . Hypertension   . Valvular disease    mild  . Wears contact lenses    Past Surgical History:  Procedure Laterality Date  .  CESAREAN SECTION     x2   . COLONOSCOPY  approx 2007  . COLONOSCOPY WITH PROPOFOL N/A 04/07/2016   Procedure: COLONOSCOPY WITH PROPOFOL;  Surgeon: DLucilla Lame MD;  [facility]  Service: Endoscopy;  Laterality: N/A;  . GANGLION CYST EXCISION    . POLYPECTOMY  04/07/2016   Procedure: POLYPECTOMY;  Surgeon: DLucilla Lame MD;  [facility]  Service: Endoscopy;;  . TONSILLECTOMY    . TUBAL LIGATION     with 2nd c-sect   Family History  Problem Relation Age of Onset  . Diabetes Father   . Alzheimer's disease Mother   . Healthy Brother   . Breast cancer Neg Hx   . Colon cancer Neg Hx   . Ovarian cancer Neg Hx    Social History   Socioeconomic History  . Marital status: Widowed    Spouse name: RJeniffer Weber . Number of children: 2  . Years of education: 116 . Highest education level: Associate degree: occupational, tHotel manager or vocational program  Occupational History    Employer: RETIRED  Tobacco Use  . Smoking status: Never Smoker  . Smokeless tobacco: Never Used  . Tobacco comment: tobacco use - no  Vaping Use  . Vaping Use: Never used  Substance and Sexual Activity  . Alcohol use: No  . Drug use: No  . Sexual activity: Not on file  Other Topics Concern  . Not on file  Social History Narrative  .  Not on file   Social Determinants of Health   Financial Resource Strain: Low Risk   . Difficulty of Paying Living Expenses: Not hard at all  Food Insecurity: No Food Insecurity  . Worried About Charity fundraiser in the Last Year: Never true  . Ran Out of Food in the Last Year: Never true  Transportation Needs: No Transportation Needs  . Lack of Transportation (Medical): No  . Lack of Transportation (Non-Medical): No  Physical Activity: Insufficiently Active  . Days of Exercise per Week: 2 days  . Minutes of Exercise per Session: 10 min  Stress: No Stress Concern Present  . Feeling of Stress : Only a little  Social Connections:  Moderately Isolated  . Frequency of Communication with Friends and Family: More than three times a week  . Frequency of Social Gatherings with Friends and Family: More than three times a week  . Attends Religious Services: Never  . Active Member of Clubs or Organizations: Yes  . Attends Archivist Meetings: More than 4 times per year  . Marital Status: Widowed    Tobacco Counseling Counseling given: Not Answered Comment: tobacco use - no   Clinical Intake:  Pre-visit preparation completed: Yes  Pain Score: 0-No pain     Nutritional Status: BMI 25 -29 Overweight Nutritional Risks: None Diabetes: No  How often do you need to have someone help you when you read instructions, pamphlets, or other written materials from your doctor or pharmacy?: 1 - Never  Diabetic? No  Interpreter Needed?: No  Information entered by :: [facility], LPN   Activities of Daily Living In your present state of health, do you have any difficulty performing the following activities: 07/20/2020  Hearing? N  Vision? N  Difficulty concentrating or making decisions? N  Walking or climbing stairs? N  Dressing or bathing? N  Doing errands, shopping? N  Preparing Food and eating ? N  Using the Toilet? N  In the past six months, have you accidently leaked urine? N  Do you have problems with loss of bowel control? N  Managing your Medications? N  Managing your Finances? N  Housekeeping or managing your Housekeeping? N  Some recent data might be hidden    Patient Care Team: Virginia Crews, MD as PCP - [facility] ([facility], [place] any recent Medical Services you may have received from other than [facility] providers in the past year (date may be approximate).     Assessment:   This is a routine wellness examination for Michaela Weber.  Hearing/Vision screen No exam data present  Dietary issues and exercise activities discussed: Current  Exercise Habits: Home exercise routine, Type of exercise: Other - see comments (Jogging in place.), Time (Minutes): 15, Frequency (Times/Week): 2, Weekly Exercise (Minutes/Week): 30, Intensity: Mild, Exercise limited by: None identified  Goals    . DIET - INCREASE WATER INTAKE     Recommend increasing water intake to 4-6 glasses a day.     . Prevent falls     Recommend to remove any items from the home that may cause slips or trips.      Depression Screen PHQ 2/9 Scores 07/20/2020 06/16/2019 02/04/2019 02/04/2019 01/15/2018 05/16/2017 05/16/2017  PHQ - 2 Score 0 0 0 0 0 0 0  PHQ- 9 Score - 0 1 - - 0 -    Fall Risk Fall Risk  07/20/2020 06/16/2019 02/04/2019 01/15/2018 05/16/2017  Falls in the past year? 1  0 0 No No  Number falls in past yr: 0 0 - - -  Injury with Fall? 0 0 - - -  Follow up Falls prevention discussed - - - -    Any stairs in or around the home? No  If so, are there any without handrails? No  Home free of loose throw rugs in walkways, pet beds, electrical cords, etc? Yes  Adequate lighting in your home to reduce risk of falls? Yes   ASSISTIVE DEVICES UTILIZED TO PREVENT FALLS:  Life alert? No  Use of a cane, walker or w/c? No  Grab bars in the bathroom? No  Shower chair or bench in shower? No  Elevated toilet seat or a handicapped toilet? Yes    Cognitive Function: Declined today.      6CIT Screen 05/16/2017  What Year? 0 points  What month? 0 points  What time? 0 points  Count back from 20 0 points  Months in reverse 0 points  Repeat phrase 0 points  Total Score 0    Immunizations Immunization History  Administered Date(s) Administered  . Fluad Quad(high Dose 65+) 06/16/2019  . Influenza, High Dose Seasonal PF 07/18/2016, 07/19/2018  . Influenza-Unspecified 06/22/2014  . Pneumococcal Conjugate-13 06/22/2014  . Pneumococcal Polysaccharide-23 11/24/2011    TDAP status: Due, Education has been provided regarding the importance of this vaccine. Advised  may receive this vaccine at local pharmacy or Health Dept. Aware to provide a copy of the vaccination record if obtained from local pharmacy or Health Dept. Verbalized acceptance and understanding. Flu Vaccine status: Declined, Education has been provided regarding the importance of this vaccine but patient still declined. Advised may receive this vaccine at local pharmacy or Health Dept. Aware to provide a copy of the vaccination record if obtained from local pharmacy or Health Dept. Verbalized acceptance and understanding. Pneumococcal vaccine status: Up to date Covid-19 vaccine status: Declined, Education has been provided regarding the importance of this vaccine but patient still declined. Advised may receive this vaccine at local pharmacy or Health Dept.or vaccine clinic. Aware to provide a copy of the vaccination record if obtained from local pharmacy or Health Dept. Verbalized acceptance and understanding.  Qualifies for Shingles Vaccine? Yes   Zostavax completed No   Shingrix Completed?: No.    Education has been provided regarding the importance of this vaccine. Patient has been advised to call insurance company to determine out of pocket expense if they have not yet received this vaccine. Advised may also receive vaccine at local pharmacy or Health Dept. Verbalized acceptance and understanding.  Screening Tests Health Maintenance  Topic Date Due  . COLONOSCOPY  04/08/2019  . DEXA SCAN  02/23/2020  . COVID-19 Vaccine (1) 08/05/2020 (Originally 03/20/1958)  . INFLUENZA VACCINE  12/23/2020 (Originally 04/25/2020)  . TETANUS/TDAP  09/25/2026 (Originally 03/20/1965)  . MAMMOGRAM  04/16/2022  . Hepatitis C Screening  Completed  . PNA vac Low Risk Adult  Completed    Health Maintenance  Health Maintenance Due  Topic Date Due  . COLONOSCOPY  04/08/2019  . DEXA SCAN  02/23/2020    Colorectal cancer screening: Currently due. Declined a colonoscopy referral and cologuard order at this time.    Mammogram status: Completed 04/16/20. Repeat every year Bone Density status: Currently due. Declined referral at this time.   Lung Cancer Screening: (Low Dose CT Chest recommended if Age 70-80 years, 30 pack-year currently smoking OR have quit w/in 15years.) does not qualify.   Additional Screening:  Hepatitis C  Screening: Up to date  Vision Screening: Recommended annual ophthalmology exams for early detection of glaucoma and other disorders of the eye. Is the patient up to date with their annual eye exam?  Yes  Who is the provider or what is the name of the office in which the patient attends annual eye exams? [facility] If pt is not established with a provider, would they like to be referred to a provider to establish care? No .   Dental Screening: Recommended annual dental exams for proper oral hygiene  Community Resource Referral / Chronic Care Management: CRR required this visit?  No   CCM required this visit?  No      Plan:     I have personally reviewed and noted the following in the patient's chart:   . Medical and social history . Use of alcohol, tobacco or illicit drugs  . Current medications and supplements . Functional ability and status . Nutritional status . Physical activity . Advanced directives . List of other physicians . Hospitalizations, surgeries, and ER visits in previous 12 months . Vitals . Screenings to include cognitive, depression, and falls . Referrals and appointments  In addition, I have reviewed and discussed with patient certain preventive protocols, quality metrics, and best practice recommendations. A written personalized care plan for preventive services as well as general preventive health recommendations were provided to patient.     Blakley Michna [place], [place]   [id number]   Nurse Notes: Pt declined a colonoscopy referral, cologuard order, DEXA order, flu vaccine and a future Covid vaccine at this time.

## 2020-07-20 ENCOUNTER — Encounter: Payer: Self-pay | Admitting: Family Medicine

## 2020-07-20 ENCOUNTER — Ambulatory Visit (INDEPENDENT_AMBULATORY_CARE_PROVIDER_SITE_OTHER): Payer: Medicare Other | Admitting: Family Medicine

## 2020-07-20 ENCOUNTER — Ambulatory Visit (INDEPENDENT_AMBULATORY_CARE_PROVIDER_SITE_OTHER): Payer: Medicare Other

## 2020-07-20 ENCOUNTER — Other Ambulatory Visit: Payer: Self-pay

## 2020-07-20 VITALS — BP 128/82 | HR 97 | Temp 98.0°F | Ht 62.0 in | Wt 143.0 lb

## 2020-07-20 DIAGNOSIS — E663 Overweight: Secondary | ICD-10-CM

## 2020-07-20 DIAGNOSIS — R7303 Prediabetes: Secondary | ICD-10-CM | POA: Diagnosis not present

## 2020-07-20 DIAGNOSIS — E538 Deficiency of other specified B group vitamins: Secondary | ICD-10-CM

## 2020-07-20 DIAGNOSIS — D531 Other megaloblastic anemias, not elsewhere classified: Secondary | ICD-10-CM | POA: Diagnosis not present

## 2020-07-20 DIAGNOSIS — Z0000 Encounter for general adult medical examination without abnormal findings: Secondary | ICD-10-CM

## 2020-07-20 DIAGNOSIS — E7800 Pure hypercholesterolemia, unspecified: Secondary | ICD-10-CM

## 2020-07-20 DIAGNOSIS — E559 Vitamin D deficiency, unspecified: Secondary | ICD-10-CM

## 2020-07-20 DIAGNOSIS — E039 Hypothyroidism, unspecified: Secondary | ICD-10-CM

## 2020-07-20 DIAGNOSIS — I10 Essential (primary) hypertension: Secondary | ICD-10-CM

## 2020-07-20 DIAGNOSIS — M8580 Other specified disorders of bone density and structure, unspecified site: Secondary | ICD-10-CM

## 2020-07-20 MED ORDER — CYANOCOBALAMIN 1000 MCG/ML IJ SOLN
1000.0000 ug | Freq: Once | INTRAMUSCULAR | Status: AC
  Administered 2020-07-20: 1000 ug via INTRAMUSCULAR

## 2020-07-20 NOTE — Progress Notes (Signed)
Annual Wellness Visit     Patient: Michaela Weber, Female    DOB: 11-Mar-1946, 74 y.o.   MRN: [id number] Visit Date: 07/20/2020  Today's Provider: Lavon Paganini, MD   Chief Complaint  Patient presents with  . Annual Exam  . Rash   Subjective    Michaela Weber is a 74 y.o. female who presents today for her Annual Wellness Visit. She reports consuming a general diet.  She generally feels well. She reports sleeping well. She does have additional problems to discuss today.   HPI   Patient Active Problem List   Diagnosis Date Noted  . Overweight 06/16/2019  . Osteopenia 01/17/2018  . History of adenomatous polyp of colon   . Allergic rhinitis 02/16/2016  . CD (contact dermatitis) 02/16/2016  . Dyshidrotic eczema 02/16/2016  . Fatigue 02/16/2016  . Acid reflux 02/16/2016  . Pre-diabetes 02/16/2016  . Vitamin D deficiency 07/14/2015  . Essential hypertension 07/14/2015  . HLD (hyperlipidemia) 07/14/2015  . MURMUR 06/16/2010  . Disorder of mitral valve 05/15/2007  . Megaloblastic anemia due to B12 deficiency 04/19/2006  . Adult hypothyroidism 02/06/2006   Past Medical History:  Diagnosis Date  . Anemia    in past  . Diastolic dysfunction   . Hypertension   . Valvular disease    mild  . Wears contact lenses    Social History   Tobacco Use  . Smoking status: Never Smoker  . Smokeless tobacco: Never Used  . Tobacco comment: tobacco use - no  Vaping Use  . Vaping Use: Never used  Substance Use Topics  . Alcohol use: No  . Drug use: No   Allergies  Allergen Reactions  . Codeine Nausea And Vomiting     Medications: Outpatient Medications Prior to Visit  Medication Sig  . aspirin 81 MG EC tablet Take 81 mg by mouth daily.    . clotrimazole (CLOTRIMAZOLE ANTI-FUNGAL) 1 % cream Apply 1 application topically 2 (two) times daily.  . Cyanocobalamin (B-12 COMPLIANCE INJECTION) 1000 MCG/ML KIT Inject 1 mL as directed every 30 (thirty) days. (Patient  taking differently: Inject 1 mL as directed. )  . levothyroxine (SYNTHROID) 112 MCG tablet Take 1 tablet (112 mcg total) by mouth daily.  Marland Kitchen lisinopril-hydrochlorothiazide (ZESTORETIC) 10-12.5 MG tablet Take 1 tablet by mouth daily.  Marland Kitchen VITAMIN D, CHOLECALCIFEROL, PO Take 2,000 Units by mouth daily.    No facility-administered medications prior to visit.    Allergies  Allergen Reactions  . Codeine Nausea And Vomiting    Patient Care Team: Virginia Crews, MD as PCP - General ([facility], [place]  Review of Systems  Constitutional: Negative.   HENT: Negative.   Eyes: Negative.   Respiratory: Negative.   Cardiovascular: Negative.   Gastrointestinal: Negative.   Endocrine: Negative.   Genitourinary: Negative.   Musculoskeletal: Negative.   Skin: Positive for rash. Negative for color change, pallor and wound.  Allergic/Immunologic: Negative.   Neurological: Negative.   Hematological: Negative.   Psychiatric/Behavioral: Negative.       Objective    Vitals: BP 128/82   Pulse 97   Temp 98 F (36.7 C) (Oral)   Ht '5\' 2"'  (1.575 m)   Wt 143 lb (64.9 kg)   SpO2 98%   BMI 26.16 kg/m    Physical Exam Vitals reviewed.  Constitutional:      General: She is not in acute distress.    Appearance: Normal appearance. She is well-developed.  She is not diaphoretic.  HENT:     Head: Normocephalic and atraumatic.     Right Ear: Tympanic membrane, ear canal and external ear normal.     Left Ear: Tympanic membrane, ear canal and external ear normal.  Eyes:     General: No scleral icterus.    Conjunctiva/sclera: Conjunctivae normal.     Pupils: Pupils are equal, round, and reactive to light.  Neck:     Thyroid: No thyromegaly.  Cardiovascular:     Rate and Rhythm: Normal rate and regular rhythm.     Pulses: Normal pulses.     Heart sounds: Normal heart sounds. No murmur heard.   Pulmonary:     Effort: Pulmonary effort is normal. No  respiratory distress.     Breath sounds: Normal breath sounds. No wheezing or rales.  Abdominal:     General: There is no distension.     Palpations: Abdomen is soft.     Tenderness: There is no abdominal tenderness.  Musculoskeletal:        General: No deformity.     Cervical back: Neck supple.     Right lower leg: No edema.     Left lower leg: No edema.  Lymphadenopathy:     Cervical: No cervical adenopathy.  Skin:    General: Skin is warm and dry.     Findings: Rash (minimal residual candidal intertrigo under L breast remaining) present.  Neurological:     Mental Status: She is alert and oriented to person, place, and time. Mental status is at baseline.     Sensory: No sensory deficit.     Motor: No weakness.     Gait: Gait normal.  Psychiatric:        Mood and Affect: Mood normal.        Behavior: Behavior normal.        Thought Content: Thought content normal.      Most recent functional status assessment: In your present state of health, do you have any difficulty performing the following activities: 07/20/2020  Hearing? N  Vision? N  Difficulty concentrating or making decisions? N  Walking or climbing stairs? N  Dressing or bathing? N  Doing errands, shopping? N  Preparing Food and eating ? N  Using the Toilet? N  In the past six months, have you accidently leaked urine? N  Do you have problems with loss of bowel control? N  Managing your Medications? N  Managing your Finances? N  Housekeeping or managing your Housekeeping? N  Some recent data might be hidden   Most recent fall risk assessment: Fall Risk  07/20/2020  Falls in the past year? 1  Number falls in past yr: 0  Injury with Fall? 0  Follow up Falls prevention discussed    Most recent depression screenings: PHQ 2/9 Scores 07/20/2020 06/16/2019  PHQ - 2 Score 0 0  PHQ- 9 Score - 0   Most recent cognitive screening: 6CIT Screen 05/16/2017  What Year? 0 points  What month? 0 points  What time? 0  points  Count back from 20 0 points  Months in reverse 0 points  Repeat phrase 0 points  Total Score 0   Most recent Audit-C alcohol use screening Alcohol Use Disorder Test (AUDIT) 07/20/2020  1. How often do you have a drink containing alcohol? 0  2. How many drinks containing alcohol do you have on a typical day when you are drinking? 0  3. How often do you have  six or more drinks on one occasion? 0  AUDIT-C Score 0  Alcohol Brief Interventions/Follow-up AUDIT Score <7 follow-up not indicated   A score of 3 or more in women, and 4 or more in men indicates increased risk for alcohol abuse, EXCEPT if all of the points are from question 1   No results found for any visits on 07/20/20.  Assessment & Plan     Annual wellness visit done today including the all of the following: Reviewed patient's Family Medical History Reviewed and updated list of patient's medical providers Assessment of cognitive impairment was done Assessed patient's functional ability Established a written schedule for health screening [place] Completed and Reviewed  Exercise Activities and Dietary recommendations Goals    . DIET - INCREASE WATER INTAKE     Recommend increasing water intake to 4-6 glasses a day.     . Prevent falls     Recommend to remove any items from the home that may cause slips or trips.       Immunization History  Administered Date(s) Administered  . Fluad Quad(high Dose 65+) 06/16/2019  . Influenza, High Dose Seasonal PF 07/18/2016, 07/19/2018  . Influenza-Unspecified 06/22/2014  . Pneumococcal Conjugate-13 06/22/2014  . Pneumococcal Polysaccharide-23 11/24/2011    Health Maintenance  Topic Date Due  . COLONOSCOPY  04/08/2019  . DEXA SCAN  02/23/2020  . COVID-19 Vaccine (1) 08/05/2020 (Originally 03/20/1958)  . INFLUENZA VACCINE  12/23/2020 (Originally 04/25/2020)  . TETANUS/TDAP  09/25/2026 (Originally 03/20/1965)  . MAMMOGRAM  04/16/2022  . Hepatitis  C Screening  Completed  . PNA vac Low Risk Adult  Completed     Discussed health benefits of physical activity, and encouraged her to engage in regular exercise appropriate for her age and condition.   Declines flu and COVID vaccines   Problem List Items Addressed This Visit      Cardiovascular and Mediastinum   Essential hypertension    Well controlled Continue current medications Recheck metabolic panel F/u in 6 months       Relevant Orders   Comprehensive metabolic panel (Completed)     Endocrine   Adult hypothyroidism    Previously well controlled Continue Synthroid at current dose  Recheck TSH and adjust Synthroid as indicated        Relevant Orders   TSH (Completed)     Musculoskeletal and Integument   Osteopenia    Continue calcium and vitamin D screening Recheck DEXA      Relevant Orders   DG Bone Density     Other   Vitamin D deficiency    Continue supplement Recheck vitamin D level      Relevant Orders   VITAMIN D 25 Hydroxy (Vit-D Deficiency, Fractures) (Completed)   HLD (hyperlipidemia)    Reviewed last lipid panel Not currently on a statin Recheck FLP and CMP Discussed diet and exercise       Relevant Orders   Lipid panel (Completed)   Comprehensive metabolic panel (Completed)   Megaloblastic anemia due to B12 deficiency    B12 injection was given today, so will not recheck level today Would prefer to check B12 trough at next visit      Pre-diabetes    Encourage low-carb diet Recheck A1c      Relevant Orders   Hemoglobin A1c (Completed)   Overweight    Discussed importance of healthy weight management Discussed diet and exercise       Relevant Orders   Lipid panel (Completed)  VITAMIN D 25 Hydroxy (Vit-D Deficiency, Fractures) (Completed)   Comprehensive metabolic panel (Completed)   Hemoglobin A1c (Completed)   TSH (Completed)    Other Visit Diagnoses    Encounter for annual physical exam    -  Primary   Relevant  Orders   Lipid panel (Completed)   VITAMIN D 25 Hydroxy (Vit-D Deficiency, Fractures) (Completed)   Comprehensive metabolic panel (Completed)   Hemoglobin A1c (Completed)   TSH (Completed)   B12 deficiency       Relevant Medications   cyanocobalamin ((VITAMIN B-12)) injection 1,000 mcg (Completed)       No follow-ups on file.     I, Lavon Paganini, MD, have reviewed all documentation for this visit. The documentation on 07/21/20 for the exam, diagnosis, procedures, and orders are all accurate and complete.   Latiana Tomei, Dionne Bucy, MD, MPH [facility]

## 2020-07-20 NOTE — Patient Instructions (Signed)
Michaela Weber , Thank you for taking time to come for your Medicare Wellness Visit. I appreciate your ongoing commitment to your health goals. Please review the following plan we discussed and let me know if I can assist you in the future.   Screening recommendations/referrals: Colonoscopy: Currently due. Declined a referral or cologuard order at this time.  Mammogram: Up to date, due 03/2021 Bone Density: Currently due. Declined an order at this time.  Recommended yearly ophthalmology/optometry visit for glaucoma screening and checkup Recommended yearly dental visit for hygiene and checkup  Vaccinations: Influenza vaccine: Currently due, declined at this time.  Pneumococcal vaccine: Completed series Tdap vaccine: Currently due, declined at this time.  Shingles vaccine: Shingrix discussed. Please contact your pharmacy for coverage information.     Advanced directives: Advance directive discussed with you today. I have provided a copy for you to complete at home and have notarized. Once this is complete please bring a copy in to our office so we can scan it into your chart.  Conditions/risks identified: Fall risk preventatives discussed today. Recommend to increase water intake to 6-8 8 oz glasses a day.   Next appointment: 10:40 AM today with Dr Brita Romp. Declined scheduling an AWV for 2022 at this time.    Preventive Care 27 Years and Older, Female Preventive care refers to lifestyle choices and visits with your health care provider that can promote health and wellness. What does preventive care include?  A yearly physical exam. This is also called an annual well check.  Dental exams once or twice a year.  Routine eye exams. Ask your health care provider how often you should have your eyes checked.  Personal lifestyle choices, including:  Daily care of your teeth and gums.  Regular physical activity.  Eating a healthy diet.  Avoiding tobacco and drug use.  Limiting alcohol  use.  Practicing safe sex.  Taking low-dose aspirin every day.  Taking vitamin and mineral supplements as recommended by your health care provider. What happens during an annual well check? The services and screenings done by your health care provider during your annual well check will depend on your age, overall health, lifestyle risk factors, and family history of disease. Counseling  Your health care provider may ask you questions about your:  Alcohol use.  Tobacco use.  Drug use.  Emotional well-being.  Home and relationship well-being.  Sexual activity.  Eating habits.  History of falls.  Memory and ability to understand (cognition).  Work and work Statistician.  Reproductive health. Screening  You may have the following tests or measurements:  Height, weight, and BMI.  Blood pressure.  Lipid and cholesterol levels. These may be checked every 5 years, or more frequently if you are over 55 years old.  Skin check.  Lung cancer screening. You may have this screening every year starting at age 69 if you have a 30-pack-year history of smoking and currently smoke or have quit within the past 15 years.  Fecal occult blood test (FOBT) of the stool. You may have this test every year starting at age 57.  Flexible sigmoidoscopy or colonoscopy. You may have a sigmoidoscopy every 5 years or a colonoscopy every 10 years starting at age 69.  Hepatitis C blood test.  Hepatitis B blood test.  Sexually transmitted disease (STD) testing.  Diabetes screening. This is done by checking your blood sugar (glucose) after you have not eaten for a while (fasting). You may have this done every 1-3 years.  Bone  density scan. This is done to screen for osteoporosis. You may have this done starting at age 22.  Mammogram. This may be done every 1-2 years. Talk to your health care provider about how often you should have regular mammograms. Talk with your health care provider about  your test results, treatment options, and if necessary, the need for more tests. Vaccines  Your health care provider may recommend certain vaccines, such as:  Influenza vaccine. This is recommended every year.  Tetanus, diphtheria, and acellular pertussis (Tdap, Td) vaccine. You may need a Td booster every 10 years.  Zoster vaccine. You may need this after age 69.  Pneumococcal 13-valent conjugate (PCV13) vaccine. One dose is recommended after age 51.  Pneumococcal polysaccharide (PPSV23) vaccine. One dose is recommended after age 55. Talk to your health care provider about which screenings and vaccines you need and how often you need them. This information is not intended to replace advice given to you by your health care provider. Make sure you discuss any questions you have with your health care provider. Document Released: 10/08/2015 Document Revised: 05/31/2016 Document Reviewed: 07/13/2015 Elsevier Interactive Patient Education  2017 [employer] Prevention in the Home Falls can cause injuries. They can happen to people of all ages. There are many things you can do to make your home safe and to help prevent falls. What can I do on the outside of my home?  Regularly fix the edges of walkways and driveways and fix any cracks.  Remove anything that might make you trip as you walk through a door, such as a raised step or threshold.  Trim any bushes or trees on the path to your home.  Use bright outdoor lighting.  Clear any walking paths of anything that might make someone trip, such as rocks or tools.  Regularly check to see if handrails are loose or broken. Make sure that both sides of any steps have handrails.  Any raised decks and porches should have guardrails on the edges.  Have any leaves, snow, or ice cleared regularly.  Use sand or salt on walking paths during winter.  Clean up any spills in your garage right away. This includes oil or grease spills. What  can I do in the bathroom?  Use night lights.  Install grab bars by the toilet and in the tub and shower. Do not use towel bars as grab bars.  Use non-skid mats or decals in the tub or shower.  If you need to sit down in the shower, use a plastic, non-slip stool.  Keep the floor dry. Clean up any water that spills on the floor as soon as it happens.  Remove soap buildup in the tub or shower regularly.  Attach bath mats securely with double-sided non-slip rug tape.  Do not have throw rugs and other things on the floor that can make you trip. What can I do in the bedroom?  Use night lights.  Make sure that you have a light by your bed that is easy to reach.  Do not use any sheets or blankets that are too big for your bed. They should not hang down onto the floor.  Have a firm chair that has side arms. You can use this for support while you get dressed.  Do not have throw rugs and other things on the floor that can make you trip. What can I do in the kitchen?  Clean up any spills right away.  Avoid walking on  wet floors.  Keep items that you use a lot in easy-to-reach places.  If you need to reach something above you, use a strong step stool that has a grab bar.  Keep electrical cords out of the way.  Do not use floor polish or wax that makes floors slippery. If you must use wax, use non-skid floor wax.  Do not have throw rugs and other things on the floor that can make you trip. What can I do with my stairs?  Do not leave any items on the stairs.  Make sure that there are handrails on both sides of the stairs and use them. Fix handrails that are broken or loose. Make sure that handrails are as long as the stairways.  Check any carpeting to make sure that it is firmly attached to the stairs. Fix any carpet that is loose or worn.  Avoid having throw rugs at the top or bottom of the stairs. If you do have throw rugs, attach them to the floor with carpet tape.  Make sure  that you have a light switch at the top of the stairs and the bottom of the stairs. If you do not have them, ask someone to add them for you. What else can I do to help prevent falls?  Wear shoes that:  Do not have high heels.  Have rubber bottoms.  Are comfortable and fit you well.  Are closed at the toe. Do not wear sandals.  If you use a stepladder:  Make sure that it is fully opened. Do not climb a closed stepladder.  Make sure that both sides of the stepladder are locked into place.  Ask someone to hold it for you, if possible.  Clearly mark and make sure that you can see:  Any grab bars or handrails.  First and last steps.  Where the edge of each step is.  Use tools that help you move around (mobility aids) if they are needed. These include:  Canes.  Walkers.  Scooters.  Crutches.  Turn on the lights when you go into a dark area. Replace any light bulbs as soon as they burn out.  Set up your furniture so you have a clear path. Avoid moving your furniture around.  If any of your floors are uneven, fix them.  If there are any pets around you, be aware of where they are.  Review your medicines with your doctor. Some medicines can make you feel dizzy. This can increase your chance of falling. Ask your doctor what other things that you can do to help prevent falls. This information is not intended to replace advice given to you by your health care provider. Make sure you discuss any questions you have with your health care provider. Document Released: 07/08/2009 Document Revised: 02/17/2016 Document Reviewed: 10/16/2014 Elsevier Interactive Patient Education  2017 [employer].

## 2020-07-20 NOTE — Patient Instructions (Signed)
The CDC recommends two doses of Shingrix (the shingles vaccine) separated by 2 to 6 months for adults age 74 years and older. I recommend checking with your insurance plan regarding coverage for this vaccine.      Preventive Care 52 Years and Older, Female Preventive care refers to lifestyle choices and visits with your health care provider that can promote health and wellness. This includes:  A yearly physical exam. This is also called an annual well check.  Regular dental and eye exams.  Immunizations.  Screening for certain conditions.  Healthy lifestyle choices, such as diet and exercise. What can I expect for my preventive care visit? Physical exam Your health care provider will check:  Height and weight. These may be used to calculate body mass index (BMI), which is a measurement that tells if you are at a healthy weight.  Heart rate and blood pressure.  Your skin for abnormal spots. Counseling Your health care provider may ask you questions about:  Alcohol, tobacco, and drug use.  Emotional well-being.  Home and relationship well-being.  Sexual activity.  Eating habits.  History of falls.  Memory and ability to understand (cognition).  Work and work Statistician.  Pregnancy and menstrual history. What immunizations do I need?  Influenza (flu) vaccine  This is recommended every year. Tetanus, diphtheria, and pertussis (Tdap) vaccine  You may need a Td booster every 10 years. Varicella (chickenpox) vaccine  You may need this vaccine if you have not already been vaccinated. Zoster (shingles) vaccine  You may need this after age 30. Pneumococcal conjugate (PCV13) vaccine  One dose is recommended after age 101. Pneumococcal polysaccharide (PPSV23) vaccine  One dose is recommended after age 47. Measles, mumps, and rubella (MMR) vaccine  You may need at least one dose of MMR if you were born in 1957 or later. You may also need a second  dose. Meningococcal conjugate (MenACWY) vaccine  You may need this if you have certain conditions. Hepatitis A vaccine  You may need this if you have certain conditions or if you travel or work in places where you may be exposed to hepatitis A. Hepatitis B vaccine  You may need this if you have certain conditions or if you travel or work in places where you may be exposed to hepatitis B. Haemophilus influenzae type b (Hib) vaccine  You may need this if you have certain conditions. You may receive vaccines as individual doses or as more than one vaccine together in one shot (combination vaccines). Talk with your health care provider about the risks and benefits of combination vaccines. What tests do I need? Blood tests  Lipid and cholesterol levels. These may be checked every 5 years, or more frequently depending on your overall health.  Hepatitis C test.  Hepatitis B test. Screening  Lung cancer screening. You may have this screening every year starting at age 63 if you have a 30-pack-year history of smoking and currently smoke or have quit within the past 15 years.  Colorectal cancer screening. All adults should have this screening starting at age 40 and continuing until age 4. Your health care provider may recommend screening at age 67 if you are at increased risk. You will have tests every 1-10 years, depending on your results and the type of screening test.  Diabetes screening. This is done by checking your blood sugar (glucose) after you have not eaten for a while (fasting). You may have this done every 1-3 years.  Mammogram. This  may be done every 1-2 years. Talk with your health care provider about how often you should have regular mammograms.  BRCA-related cancer screening. This may be done if you have a family history of breast, ovarian, tubal, or peritoneal cancers. Other tests  Sexually transmitted disease (STD) testing.  Bone density scan. This is done to screen for  osteoporosis. You may have this done starting at age 86. Follow these instructions at home: Eating and drinking  Eat a diet that includes fresh fruits and vegetables, whole grains, lean protein, and low-fat dairy products. Limit your intake of foods with high amounts of sugar, saturated fats, and salt.  Take vitamin and mineral supplements as recommended by your health care provider.  Do not drink alcohol if your health care provider tells you not to drink.  If you drink alcohol: ? Limit how much you have to 0-1 drink a day. ? Be aware of how much alcohol is in your drink. In the [place] one drink equals one 12 oz bottle of beer (355 mL), one 5 oz glass of wine (148 mL), or one 1 oz glass of hard liquor (44 mL). Lifestyle  Take daily care of your teeth and gums.  Stay active. Exercise for at least 30 minutes on 5 or more days each week.  Do not use any products that contain nicotine or tobacco, such as cigarettes, e-cigarettes, and chewing tobacco. If you need help quitting, ask your health care provider.  If you are sexually active, practice safe sex. Use a condom or other form of protection in order to prevent STIs (sexually transmitted infections).  Talk with your health care provider about taking a low-dose aspirin or statin. What's next?  Go to your health care provider once a year for a well check visit.  Ask your health care provider how often you should have your eyes and teeth checked.  Stay up to date on all vaccines. This information is not intended to replace advice given to you by your health care provider. Make sure you discuss any questions you have with your health care provider. Document Revised: 09/05/2018 Document Reviewed: 09/05/2018 Elsevier Patient Education  2020 [employer].

## 2020-07-21 LAB — COMPREHENSIVE METABOLIC PANEL
ALT: 12 IU/L (ref 0–32)
AST: 15 IU/L (ref 0–40)
Albumin/Globulin Ratio: 1.8 (ref 1.2–2.2)
Albumin: 4.5 g/dL (ref 3.7–4.7)
Alkaline Phosphatase: 113 IU/L (ref 44–121)
BUN/Creatinine Ratio: 13 (ref 12–28)
BUN: 8 mg/dL (ref 8–27)
Bilirubin Total: 0.4 mg/dL (ref 0.0–1.2)
CO2: 27 mmol/L (ref 20–29)
Calcium: 10.2 mg/dL (ref 8.7–10.3)
Chloride: 98 mmol/L (ref 96–106)
Creatinine, Ser: 0.63 mg/dL (ref 0.57–1.00)
GFR calc Af Amer: 102 mL/min/{1.73_m2} (ref >59)
GFR calc non Af Amer: 89 mL/min/{1.73_m2} (ref >59)
Globulin, Total: 2.5 g/dL (ref 1.5–4.5)
Glucose: 93 mg/dL (ref 65–99)
Potassium: 4.4 mmol/L (ref 3.5–5.2)
Sodium: 139 mmol/L (ref 134–144)
Total Protein: 7 g/dL (ref 6.0–8.5)

## 2020-07-21 LAB — LIPID PANEL
Chol/HDL Ratio: 3.6 ratio (ref 0.0–4.4)
Cholesterol, Total: 213 mg/dL — ABNORMAL HIGH (ref 100–199)
HDL: 59 mg/dL (ref >39)
LDL Chol Calc (NIH): 136 mg/dL — ABNORMAL HIGH (ref 0–99)
Triglycerides: 103 mg/dL (ref 0–149)
VLDL Cholesterol Cal: 18 mg/dL (ref 5–40)

## 2020-07-21 LAB — TSH: TSH: 0.897 u[IU]/mL (ref 0.450–4.500)

## 2020-07-21 LAB — HEMOGLOBIN A1C
Est. average glucose Bld gHb Est-mCnc: 131 mg/dL
Hgb A1c MFr Bld: 6.2 % — ABNORMAL HIGH (ref 4.8–5.6)

## 2020-07-21 LAB — VITAMIN D 25 HYDROXY (VIT D DEFICIENCY, FRACTURES): Vit D, 25-Hydroxy: 41.5 ng/mL (ref 30.0–100.0)

## 2020-07-21 NOTE — Assessment & Plan Note (Signed)
Well controlled Continue current medications Recheck metabolic panel F/u in 6 months  

## 2020-07-21 NOTE — Assessment & Plan Note (Signed)
Discussed importance of healthy weight management Discussed diet and exercise  

## 2020-07-21 NOTE — Assessment & Plan Note (Signed)
Continue supplement Recheck vitamin D level 

## 2020-07-21 NOTE — Assessment & Plan Note (Signed)
Encourage low carb diet Recheck A1c 

## 2020-07-21 NOTE — Assessment & Plan Note (Signed)
Continue calcium and vitamin D screening Recheck DEXA

## 2020-07-21 NOTE — Assessment & Plan Note (Signed)
Reviewed last lipid panel Not currently on a statin Recheck FLP and CMP Discussed diet and exercise  

## 2020-07-21 NOTE — Assessment & Plan Note (Signed)
B12 injection was given today, so will not recheck level today Would prefer to check B12 trough at next visit

## 2020-07-21 NOTE — Assessment & Plan Note (Signed)
Previously well controlled Continue Synthroid at current dose  Recheck TSH and adjust Synthroid as indicated   

## 2020-08-18 ENCOUNTER — Ambulatory Visit (INDEPENDENT_AMBULATORY_CARE_PROVIDER_SITE_OTHER): Payer: Medicare Other

## 2020-08-18 ENCOUNTER — Other Ambulatory Visit: Payer: Self-pay

## 2020-08-18 DIAGNOSIS — E538 Deficiency of other specified B group vitamins: Secondary | ICD-10-CM

## 2020-08-18 MED ORDER — CYANOCOBALAMIN 1000 MCG/ML IJ SOLN
1000.0000 ug | Freq: Once | INTRAMUSCULAR | Status: AC
  Administered 2020-08-18: 1000 ug via INTRAMUSCULAR

## 2020-09-15 ENCOUNTER — Other Ambulatory Visit: Payer: Self-pay

## 2020-09-15 ENCOUNTER — Ambulatory Visit (INDEPENDENT_AMBULATORY_CARE_PROVIDER_SITE_OTHER): Payer: Medicare Other

## 2020-09-15 DIAGNOSIS — E538 Deficiency of other specified B group vitamins: Secondary | ICD-10-CM | POA: Diagnosis not present

## 2020-09-15 MED ORDER — CYANOCOBALAMIN 1000 MCG/ML IJ SOLN
1000.0000 ug | Freq: Once | INTRAMUSCULAR | Status: AC
  Administered 2020-09-15: 1000 ug via INTRAMUSCULAR

## 2020-09-27 ENCOUNTER — Telehealth: Payer: Self-pay | Admitting: Family Medicine

## 2020-09-27 DIAGNOSIS — I10 Essential (primary) hypertension: Secondary | ICD-10-CM

## 2020-09-27 NOTE — Telephone Encounter (Signed)
[facility] faxed refill request for the following medications:  lisinopril-hydrochlorothiazide (ZESTORETIC) 10-12.5 MG tablet   Please advise.

## 2020-09-28 MED ORDER — LISINOPRIL-HYDROCHLOROTHIAZIDE 10-12.5 MG PO TABS: 1.0000 | ORAL_TABLET | Freq: Every day | ORAL | 1 refills | Status: DC

## 2020-10-20 ENCOUNTER — Ambulatory Visit: Payer: Self-pay

## 2020-11-03 ENCOUNTER — Other Ambulatory Visit: Payer: Self-pay

## 2020-11-03 ENCOUNTER — Ambulatory Visit (INDEPENDENT_AMBULATORY_CARE_PROVIDER_SITE_OTHER): Payer: Medicare Other

## 2020-11-03 DIAGNOSIS — E538 Deficiency of other specified B group vitamins: Secondary | ICD-10-CM | POA: Diagnosis not present

## 2020-11-03 MED ORDER — CYANOCOBALAMIN 1000 MCG/ML IJ SOLN
1000.0000 ug | Freq: Once | INTRAMUSCULAR | Status: AC
  Administered 2020-11-03: 1000 ug via INTRAMUSCULAR

## 2020-12-01 ENCOUNTER — Ambulatory Visit (INDEPENDENT_AMBULATORY_CARE_PROVIDER_SITE_OTHER): Payer: Medicare Other

## 2020-12-01 ENCOUNTER — Other Ambulatory Visit: Payer: Self-pay

## 2020-12-01 DIAGNOSIS — E538 Deficiency of other specified B group vitamins: Secondary | ICD-10-CM

## 2020-12-01 MED ORDER — CYANOCOBALAMIN 1000 MCG/ML IJ SOLN
1000.0000 ug | Freq: Once | INTRAMUSCULAR | Status: AC
  Administered 2020-12-01: 1000 ug via INTRAMUSCULAR

## 2020-12-16 ENCOUNTER — Other Ambulatory Visit: Payer: Self-pay | Admitting: Family Medicine

## 2020-12-29 ENCOUNTER — Other Ambulatory Visit: Payer: Self-pay

## 2020-12-29 ENCOUNTER — Ambulatory Visit (INDEPENDENT_AMBULATORY_CARE_PROVIDER_SITE_OTHER): Payer: Medicare Other | Admitting: *Deleted

## 2020-12-29 DIAGNOSIS — E538 Deficiency of other specified B group vitamins: Secondary | ICD-10-CM

## 2020-12-29 MED ORDER — CYANOCOBALAMIN 1000 MCG/ML IJ SOLN
1000.0000 ug | Freq: Once | INTRAMUSCULAR | Status: AC
  Administered 2020-12-29: 1000 ug via INTRAMUSCULAR

## 2020-12-29 NOTE — Progress Notes (Signed)
Patient was was given B12 injection in left ventrogluteal. Patient tolerated well.

## 2021-01-20 ENCOUNTER — Ambulatory Visit (INDEPENDENT_AMBULATORY_CARE_PROVIDER_SITE_OTHER): Payer: Medicare Other | Admitting: Family Medicine

## 2021-01-20 ENCOUNTER — Encounter: Payer: Self-pay | Admitting: Family Medicine

## 2021-01-20 ENCOUNTER — Other Ambulatory Visit: Payer: Self-pay

## 2021-01-20 VITALS — BP 121/78 | HR 86 | Temp 98.3°F | Wt 137.0 lb

## 2021-01-20 DIAGNOSIS — E039 Hypothyroidism, unspecified: Secondary | ICD-10-CM | POA: Diagnosis not present

## 2021-01-20 DIAGNOSIS — R7303 Prediabetes: Secondary | ICD-10-CM

## 2021-01-20 DIAGNOSIS — I10 Essential (primary) hypertension: Secondary | ICD-10-CM | POA: Diagnosis not present

## 2021-01-20 DIAGNOSIS — B372 Candidiasis of skin and nail: Secondary | ICD-10-CM | POA: Diagnosis not present

## 2021-01-20 DIAGNOSIS — E559 Vitamin D deficiency, unspecified: Secondary | ICD-10-CM

## 2021-01-20 MED ORDER — CLOTRIMAZOLE-BETAMETHASONE 1-0.05 % EX CREA: 1.0000 "application " | TOPICAL_CREAM | Freq: Every day | CUTANEOUS | 1 refills | Status: DC

## 2021-01-20 NOTE — Assessment & Plan Note (Signed)
Well controlled Continue current medications Recheck metabolic panel F/u in 6 months  

## 2021-01-20 NOTE — Assessment & Plan Note (Signed)
Previously well controlled Continue Synthroid at current dose  Recheck TSH and adjust Synthroid as indicated   

## 2021-01-20 NOTE — Assessment & Plan Note (Signed)
Recheck level Continue supplement

## 2021-01-20 NOTE — Assessment & Plan Note (Signed)
Recommend low carb diet °Recheck A1c  °

## 2021-01-20 NOTE — Progress Notes (Signed)
Established patient visit   Patient: Michaela Weber   DOB: 1945/10/03   75 y.o. Female  MRN: [id number] Visit Date: 01/20/2021  Today's healthcare provider: Lavon Paganini, MD   Chief Complaint  Patient presents with  . Rash   Subjective    Rash This is a recurrent problem. The current episode started more than 1 month ago. The problem has been waxing and waning since onset. The rash is characterized by redness and itchiness. She was exposed to nothing. Pertinent negatives include no congestion, diarrhea, eye pain, fatigue, fever, shortness of breath, sore throat or vomiting.  She is currently taking a antifungal cream.  She is experiencing some redness after applying the cream.   Hypothyroid, follow-up  Lab Results  Component Value Date   TSH 0.897 07/20/2020   TSH 1.050 02/05/2020   TSH 0.975 10/30/2019   T4TOTAL 8.7 01/18/2017   Wt Readings from Last 3 Encounters:  01/20/21 137 lb (62.1 kg)  07/20/20 143 lb (64.9 kg)  07/20/20 143 lb (64.9 kg)    She was last seen for hypothyroid 6 months ago.  Management since that visit includes no changes. She reports excellent compliance with treatment. She is not having side effects.   Symptoms: No change in energy level No constipation  No diarrhea No heat / cold intolerance  No nervousness No palpitations  No weight changes    ----------------------------------------------------------------------------------------- Prediabetes, Follow-up  Lab Results  Component Value Date   HGBA1C 6.2 (H) 07/20/2020   HGBA1C 6.2 (H) 02/05/2020   HGBA1C 6.2 (H) 06/25/2019   GLUCOSE 93 07/20/2020   GLUCOSE 107 (H) 02/05/2020   GLUCOSE 105 (H) 06/25/2019    Last seen for for this6 months ago.  Management since that visit includes no changes. Current symptoms include none and have been stable.  Pertinent Labs:    Component Value Date/Time   CHOL 213 (H) 07/20/2020 1150   TRIG 103 07/20/2020 1150   CHOLHDL 3.6  07/20/2020 1150   CREATININE 0.63 07/20/2020 1150   CREATININE 0.63 07/19/2017 0903    Wt Readings from Last 3 Encounters:  01/20/21 137 lb (62.1 kg)  07/20/20 143 lb (64.9 kg)  07/20/20 143 lb (64.9 kg)    ----------------------------------------------------------------------------------------- Hypertension, follow-up  BP Readings from Last 3 Encounters:  01/20/21 121/78  07/20/20 128/82  07/20/20 128/82   Wt Readings from Last 3 Encounters:  01/20/21 137 lb (62.1 kg)  07/20/20 143 lb (64.9 kg)  07/20/20 143 lb (64.9 kg)     She was last seen for hypertension 6 months ago.  BP at that visit was 128/82. Management since that visit includes no changes.  She reports excellent compliance with treatment. She is not having side effects.  She is following a Regular diet. She is exercising. She does not smoke.  Use of agents associated with hypertension: none.   Outside blood pressures are normal at home.   Symptoms: No chest pain No chest pressure  No palpitations No syncope  No dyspnea No orthopnea  No paroxysmal nocturnal dyspnea No lower extremity edema   Pertinent labs: Lab Results  Component Value Date   CHOL 213 (H) 07/20/2020   HDL 59 07/20/2020   LDLCALC 136 (H) 07/20/2020   TRIG 103 07/20/2020   CHOLHDL 3.6 07/20/2020   Lab Results  Component Value Date   NA 139 07/20/2020   K 4.4 07/20/2020   CREATININE 0.63 07/20/2020   GFRNONAA 89 07/20/2020   GFRAA 102 07/20/2020  GLUCOSE 93 07/20/2020     The 10-year ASCVD risk score Mikey Bussing DC Brooke Bonito., et al., 2013) is: 17.3%   ---------------------------------------------------------------------------------------------------   Patient Active Problem List   Diagnosis Date Noted  . Overweight 06/16/2019  . Osteopenia 01/17/2018  . History of adenomatous polyp of colon   . Allergic rhinitis 02/16/2016  . CD (contact dermatitis) 02/16/2016  . Dyshidrotic eczema 02/16/2016  . Fatigue 02/16/2016  . Acid reflux  02/16/2016  . Pre-diabetes 02/16/2016  . Vitamin D deficiency 07/14/2015  . Essential hypertension 07/14/2015  . HLD (hyperlipidemia) 07/14/2015  . MURMUR 06/16/2010  . Disorder of mitral valve 05/15/2007  . Megaloblastic anemia due to B12 deficiency 04/19/2006  . Adult hypothyroidism 02/06/2006   Past Medical History:  Diagnosis Date  . Anemia    in past  . Diastolic dysfunction   . Hypertension   . Valvular disease    mild  . Wears contact lenses    Social History   Tobacco Use  . Smoking status: Never Smoker  . Smokeless tobacco: Never Used  . Tobacco comment: tobacco use - no  Vaping Use  . Vaping Use: Never used  Substance Use Topics  . Alcohol use: No  . Drug use: No   Allergies  Allergen Reactions  . Codeine Nausea And Vomiting     Medications: Outpatient Medications Prior to Visit  Medication Sig  . aspirin 81 MG EC tablet Take 81 mg by mouth daily.  . Cyanocobalamin (B-12 COMPLIANCE INJECTION) 1000 MCG/ML KIT Inject 1 mL as directed every 30 (thirty) days. (Patient taking differently: Inject 1 mL as directed.)  . levothyroxine (SYNTHROID) 112 MCG tablet Take 1 tablet (112 mcg total) by mouth daily.  Marland Kitchen lisinopril-hydrochlorothiazide (ZESTORETIC) 10-12.5 MG tablet Take 1 tablet by mouth daily.  Marland Kitchen VITAMIN D, CHOLECALCIFEROL, PO Take 2,000 Units by mouth daily.  . [DISCONTINUED] clotrimazole (LOTRIMIN) 1 % cream APPLY TOPICALLY TO THE AFFECTED AREA TWICE DAILY   No facility-administered medications prior to visit.    Review of Systems  Constitutional: Negative.  Negative for chills, fatigue and fever.  HENT: Negative for congestion, ear pain, sinus pain and sore throat.   Eyes: Negative for pain.  Respiratory: Negative.  Negative for shortness of breath and wheezing.   Cardiovascular: Negative.  Negative for chest pain, palpitations and leg swelling.  Gastrointestinal: Negative.  Negative for abdominal pain, blood in stool, diarrhea, nausea and vomiting.   Genitourinary: Negative for dysuria and flank pain.  Skin: Positive for rash (right breast and anterior elbow ).  Neurological: Negative for dizziness, light-headedness and headaches.        Objective    BP 121/78 (BP Location: Left Arm, Patient Position: Sitting, Cuff Size: Large)   Pulse 86   Temp 98.3 F (36.8 C) (Oral)   Wt 137 lb (62.1 kg)   BMI 25.06 kg/m    Physical Exam Vitals reviewed.  Constitutional:      General: She is not in acute distress.    Appearance: Normal appearance. She is well-developed. She is not diaphoretic.  HENT:     Head: Normocephalic and atraumatic.  Eyes:     General: No scleral icterus.    Conjunctiva/sclera: Conjunctivae normal.  Neck:     Thyroid: No thyromegaly.  Cardiovascular:     Rate and Rhythm: Normal rate and regular rhythm.     Pulses: Normal pulses.     Heart sounds: Normal heart sounds. No murmur heard.   Pulmonary:     Effort: Pulmonary  effort is normal. No respiratory distress.     Breath sounds: Normal breath sounds. No wheezing, rhonchi or rales.  Musculoskeletal:     Cervical back: Neck supple.     Right lower leg: No edema.     Left lower leg: No edema.  Lymphadenopathy:     Cervical: No cervical adenopathy.  Skin:    General: Skin is warm and dry.     Findings: No rash.  Neurological:     Mental Status: She is alert and oriented to person, place, and time. Mental status is at baseline.  Psychiatric:        Mood and Affect: Mood normal.        Behavior: Behavior normal.       No results found for any visits on 01/20/21.  Assessment & Plan     Problem List Items Addressed This Visit      Cardiovascular and Mediastinum   Essential hypertension - Primary    Well controlled Continue current medications Recheck metabolic panel F/u in 6 months       Relevant Orders   Comprehensive metabolic panel     Endocrine   Adult hypothyroidism    Previously well controlled Continue Synthroid at current  dose  Recheck TSH and adjust Synthroid as indicated        Relevant Orders   TSH     Other   Vitamin D deficiency    Recheck level Continue supplement      Relevant Orders   VITAMIN D 25 Hydroxy (Vit-D Deficiency, Fractures)   Pre-diabetes    Recommend low carb diet Recheck A1c      Relevant Orders   Hemoglobin A1c    Other Visit Diagnoses    Candidal intertrigo       Relevant Medications   clotrimazole-betamethasone (LOTRISONE) cream    - failed clotrimazole - change to lotrisone -return precautions discussed   Return in about 6 months (around 07/22/2021) for AWV, CPE.       I,Essence Turner,acting as a scribe for Lavon Paganini, MD.,have documented all relevant documentation on the behalf of Lavon Paganini, MD,as directed by  Lavon Paganini, MD while in the presence of Lavon Paganini, MD.  I, Lavon Paganini, MD, have reviewed all documentation for this visit. The documentation on 01/20/21 for the exam, diagnosis, procedures, and orders are all accurate and complete.   Melvern Ramone, Dionne Bucy, MD, MPH [facility]

## 2021-01-21 LAB — COMPREHENSIVE METABOLIC PANEL
ALT: 9 IU/L (ref 0–32)
AST: 11 IU/L (ref 0–40)
Albumin/Globulin Ratio: 1.8 (ref 1.2–2.2)
Albumin: 4.8 g/dL — ABNORMAL HIGH (ref 3.7–4.7)
Alkaline Phosphatase: 117 IU/L (ref 44–121)
BUN/Creatinine Ratio: 14 (ref 12–28)
BUN: 10 mg/dL (ref 8–27)
Bilirubin Total: 0.4 mg/dL (ref 0.0–1.2)
CO2: 26 mmol/L (ref 20–29)
Calcium: 10.1 mg/dL (ref 8.7–10.3)
Chloride: 99 mmol/L (ref 96–106)
Creatinine, Ser: 0.7 mg/dL (ref 0.57–1.00)
Globulin, Total: 2.6 g/dL (ref 1.5–4.5)
Glucose: 113 mg/dL — ABNORMAL HIGH (ref 65–99)
Potassium: 4.2 mmol/L (ref 3.5–5.2)
Sodium: 142 mmol/L (ref 134–144)
Total Protein: 7.4 g/dL (ref 6.0–8.5)
eGFR: 91 mL/min/{1.73_m2} (ref >59)

## 2021-01-21 LAB — TSH: TSH: 0.732 u[IU]/mL (ref 0.450–4.500)

## 2021-01-21 LAB — HEMOGLOBIN A1C
Est. average glucose Bld gHb Est-mCnc: 134 mg/dL
Hgb A1c MFr Bld: 6.3 % — ABNORMAL HIGH (ref 4.8–5.6)

## 2021-01-21 LAB — VITAMIN D 25 HYDROXY (VIT D DEFICIENCY, FRACTURES): Vit D, 25-Hydroxy: 47.1 ng/mL (ref 30.0–100.0)

## 2021-01-26 ENCOUNTER — Ambulatory Visit (INDEPENDENT_AMBULATORY_CARE_PROVIDER_SITE_OTHER): Payer: Medicare Other

## 2021-01-26 ENCOUNTER — Other Ambulatory Visit: Payer: Self-pay

## 2021-01-26 VITALS — BP 121/78 | Temp 98.6°F | Ht 62.0 in | Wt 137.4 lb

## 2021-01-26 DIAGNOSIS — E538 Deficiency of other specified B group vitamins: Secondary | ICD-10-CM | POA: Diagnosis not present

## 2021-01-26 MED ORDER — CYANOCOBALAMIN 1000 MCG/ML IJ SOLN
1000.0000 ug | Freq: Once | INTRAMUSCULAR | Status: AC
  Administered 2021-01-26: 1000 ug via INTRAMUSCULAR

## 2021-02-23 ENCOUNTER — Ambulatory Visit (INDEPENDENT_AMBULATORY_CARE_PROVIDER_SITE_OTHER): Payer: Medicare Other | Admitting: *Deleted

## 2021-02-23 ENCOUNTER — Other Ambulatory Visit: Payer: Self-pay

## 2021-02-23 DIAGNOSIS — E538 Deficiency of other specified B group vitamins: Secondary | ICD-10-CM

## 2021-02-23 MED ORDER — CYANOCOBALAMIN 1000 MCG/ML IJ SOLN
1000.0000 ug | Freq: Once | INTRAMUSCULAR | Status: AC
  Administered 2021-02-23: 1000 ug via INTRAMUSCULAR

## 2021-02-23 NOTE — Progress Notes (Signed)
Patient was given B12 injection in Left Ventrogluteal. Patient tolerated well.

## 2021-03-09 ENCOUNTER — Other Ambulatory Visit: Payer: Self-pay | Admitting: Family Medicine

## 2021-03-09 DIAGNOSIS — Z1231 Encounter for screening mammogram for malignant neoplasm of breast: Secondary | ICD-10-CM

## 2021-03-19 ENCOUNTER — Other Ambulatory Visit: Payer: Self-pay | Admitting: Family Medicine

## 2021-03-19 DIAGNOSIS — I10 Essential (primary) hypertension: Secondary | ICD-10-CM

## 2021-03-19 DIAGNOSIS — E039 Hypothyroidism, unspecified: Secondary | ICD-10-CM

## 2021-03-19 NOTE — Telephone Encounter (Signed)
Requested Prescriptions  Pending Prescriptions Disp Refills  . levothyroxine (SYNTHROID) 112 MCG tablet [Pharmacy Med Name: LEVOTHYROXINE 0.112MG  (112MCG) TABS] 90 tablet 2    Sig: TAKE 1 TABLET(112 MCG) BY MOUTH DAILY     Endocrinology:  Hypothyroid Agents Failed - 03/19/2021  8:39 AM      Failed - TSH needs to be rechecked within 3 months after an abnormal result. Refill until TSH is due.      Passed - TSH in normal range and within 360 days    TSH  Date Value Ref Range Status  01/20/2021 0.732 0.450 - 4.500 uIU/mL Final         Passed - Valid encounter within last 12 months    Recent Outpatient Visits          1 month ago Essential hypertension   [place], Dionne Bucy, MD   8 months ago Encounter for annual physical exam   [facility] [place], Dionne Bucy, MD   9 months ago Candidal intertrigo   [facility] [place], Dionne Bucy, MD   1 year ago Essential hypertension   [facility], Dionne Bucy, MD   1 year ago Encounter for annual physical exam   [facility], Dionne Bucy, MD

## 2021-03-19 NOTE — Telephone Encounter (Signed)
Requested Prescriptions  Pending Prescriptions Disp Refills  . lisinopril-hydrochlorothiazide (ZESTORETIC) 10-12.5 MG tablet [Pharmacy Med Name: LISINOPRIL-HCTZ 10/12.5MG  TABLETS] 90 tablet 1    Sig: TAKE 1 TABLET BY MOUTH DAILY     Cardiovascular:  ACEI + Diuretic Combos Passed - 03/19/2021  9:32 AM      Passed - Na in normal range and within 180 days    Sodium  Date Value Ref Range Status  01/20/2021 142 134 - 144 mmol/L Final         Passed - K in normal range and within 180 days    Potassium  Date Value Ref Range Status  01/20/2021 4.2 3.5 - 5.2 mmol/L Final         Passed - Cr in normal range and within 180 days    Creat  Date Value Ref Range Status  07/19/2017 0.63 0.60 - 0.93 mg/dL Final    Comment:    For patients >30 years of age, the reference limit for Creatinine is approximately 13% higher for people identified as African-American. .    Creatinine, Ser  Date Value Ref Range Status  01/20/2021 0.70 0.57 - 1.00 mg/dL Final         Passed - Ca in normal range and within 180 days    Calcium  Date Value Ref Range Status  01/20/2021 10.1 8.7 - 10.3 mg/dL Final         Passed - Patient is not pregnant      Passed - Last BP in normal range    BP Readings from Last 1 Encounters:  01/26/21 121/78         Passed - Valid encounter within last 6 months    Recent Outpatient Visits          1 month ago Essential hypertension   [place], Dionne Bucy, MD   8 months ago Encounter for annual physical exam   [facility] [place], Dionne Bucy, MD   9 months ago Candidal intertrigo   [facility] [place], Dionne Bucy, MD   1 year ago Essential hypertension   [facility] [place], Dionne Bucy, MD   1 year ago Encounter for annual physical exam   [facility], Dionne Bucy, MD

## 2021-03-23 ENCOUNTER — Other Ambulatory Visit: Payer: Self-pay

## 2021-03-23 ENCOUNTER — Ambulatory Visit (INDEPENDENT_AMBULATORY_CARE_PROVIDER_SITE_OTHER): Payer: Medicare Other

## 2021-03-23 DIAGNOSIS — E538 Deficiency of other specified B group vitamins: Secondary | ICD-10-CM

## 2021-03-23 MED ORDER — CYANOCOBALAMIN 1000 MCG/ML IJ SOLN
1000.0000 ug | Freq: Once | INTRAMUSCULAR | Status: AC
  Administered 2021-03-23: 1000 ug via INTRAMUSCULAR

## 2021-03-23 NOTE — Progress Notes (Signed)
Administered B12 1,000mg /mL 24mL in right ventrogluteal with 25g 1.5in needle. Patient tolerated injection well. Appt scheduled for 4 weeks for next injection.

## 2021-04-14 ENCOUNTER — Telehealth: Payer: Self-pay

## 2021-04-14 NOTE — Telephone Encounter (Signed)
Lmtcb please have [place] triage speak and triage patient with symptoms. I need to know when symptoms began? When was she tested? What symptoms does she currently have? Has she tried taking any otc medication to relieve symptoms?  What pharmacy does patient use? KW

## 2021-04-14 NOTE — Telephone Encounter (Signed)
Covid positive on 04/11/21. Symptoms began 04/10/21 fatigue/weakness/HA. Denies SOB/CP/fever. Reviewed care advice including isolate period, tylenol/advil for discomfort, plenty of fluids daily and deep breathing exercises often. If any changes with your breathing such as wheezing or SOB while resting call back or seek treatment at the emergency room. Patient verbalized understanding.

## 2021-04-14 NOTE — Telephone Encounter (Signed)
Copied from [place] [id number]. Topic: General - Other >> Apr 14, 2021  8:15 AM Leward Quan A wrote: Reason for CRM: Patient called in to tell Dr B that she tested positive for Covid and wanted to inform that she was weak, had a headache since Sunday 04/10/21. Wanted to know if there is anything that she need to be doing Please call Ph# [phone]

## 2021-04-14 NOTE — Telephone Encounter (Signed)
FYI see message below. KW

## 2021-04-20 ENCOUNTER — Ambulatory Visit: Payer: Self-pay

## 2021-04-27 ENCOUNTER — Ambulatory Visit (INDEPENDENT_AMBULATORY_CARE_PROVIDER_SITE_OTHER): Payer: Medicare Other

## 2021-04-27 ENCOUNTER — Other Ambulatory Visit: Payer: Self-pay

## 2021-04-27 DIAGNOSIS — E538 Deficiency of other specified B group vitamins: Secondary | ICD-10-CM

## 2021-04-27 MED ORDER — CYANOCOBALAMIN 1000 MCG/ML IJ SOLN
1000.0000 ug | Freq: Once | INTRAMUSCULAR | Status: AC
  Administered 2021-04-27: 1000 ug via INTRAMUSCULAR

## 2021-05-12 ENCOUNTER — Other Ambulatory Visit: Payer: Self-pay

## 2021-05-12 ENCOUNTER — Ambulatory Visit
Admission: RE | Admit: 2021-05-12 | Discharge: 2021-05-12 | Disposition: A | Discharge status: Home / Self Care | Payer: Medicare Other | Source: Ambulatory Visit | Attending: Family Medicine | Admitting: Family Medicine

## 2021-05-12 DIAGNOSIS — Z1231 Encounter for screening mammogram for malignant neoplasm of breast: Secondary | ICD-10-CM | POA: Diagnosis not present

## 2021-05-25 ENCOUNTER — Ambulatory Visit (INDEPENDENT_AMBULATORY_CARE_PROVIDER_SITE_OTHER): Payer: Medicare Other

## 2021-05-25 ENCOUNTER — Other Ambulatory Visit: Payer: Self-pay

## 2021-05-25 DIAGNOSIS — E538 Deficiency of other specified B group vitamins: Secondary | ICD-10-CM | POA: Diagnosis not present

## 2021-05-25 MED ORDER — CYANOCOBALAMIN 1000 MCG/ML IJ SOLN
1000.0000 ug | Freq: Once | INTRAMUSCULAR | Status: AC
  Administered 2021-05-25: 1000 ug via INTRAMUSCULAR

## 2021-06-22 ENCOUNTER — Ambulatory Visit (INDEPENDENT_AMBULATORY_CARE_PROVIDER_SITE_OTHER): Payer: Medicare Other

## 2021-06-22 ENCOUNTER — Other Ambulatory Visit: Payer: Self-pay

## 2021-06-22 DIAGNOSIS — E538 Deficiency of other specified B group vitamins: Secondary | ICD-10-CM | POA: Diagnosis not present

## 2021-06-22 MED ORDER — CYANOCOBALAMIN 1000 MCG/ML IJ SOLN
1000.0000 ug | Freq: Once | INTRAMUSCULAR | Status: AC
  Administered 2021-06-22: 1000 ug via INTRAMUSCULAR

## 2021-07-12 ENCOUNTER — Telehealth: Payer: Self-pay | Admitting: Family Medicine

## 2021-07-12 NOTE — Telephone Encounter (Signed)
Copied from [place] [phone]. Topic: Medicare AWV >> Jul 12, 2021 12:02 PM Cher Nakai R wrote: Reason for CRM:  LM re: new AWV appt information -rescheduled due to [facility] not in office this day-srs

## 2021-07-26 ENCOUNTER — Other Ambulatory Visit: Payer: Self-pay

## 2021-07-26 ENCOUNTER — Encounter: Payer: Self-pay | Admitting: Family Medicine

## 2021-07-26 ENCOUNTER — Ambulatory Visit (INDEPENDENT_AMBULATORY_CARE_PROVIDER_SITE_OTHER): Payer: Medicare Other | Admitting: Family Medicine

## 2021-07-26 VITALS — BP 131/71 | HR 90 | Temp 97.7°F | Resp 16 | Ht 62.0 in | Wt 139.9 lb

## 2021-07-26 DIAGNOSIS — E039 Hypothyroidism, unspecified: Secondary | ICD-10-CM

## 2021-07-26 DIAGNOSIS — Z86010 Personal history of colonic polyps: Secondary | ICD-10-CM

## 2021-07-26 DIAGNOSIS — M8580 Other specified disorders of bone density and structure, unspecified site: Secondary | ICD-10-CM | POA: Diagnosis not present

## 2021-07-26 DIAGNOSIS — E559 Vitamin D deficiency, unspecified: Secondary | ICD-10-CM | POA: Diagnosis not present

## 2021-07-26 DIAGNOSIS — D531 Other megaloblastic anemias, not elsewhere classified: Secondary | ICD-10-CM

## 2021-07-26 DIAGNOSIS — R7303 Prediabetes: Secondary | ICD-10-CM | POA: Diagnosis not present

## 2021-07-26 DIAGNOSIS — I10 Essential (primary) hypertension: Secondary | ICD-10-CM

## 2021-07-26 DIAGNOSIS — Z0000 Encounter for general adult medical examination without abnormal findings: Secondary | ICD-10-CM | POA: Insufficient documentation

## 2021-07-26 DIAGNOSIS — E538 Deficiency of other specified B group vitamins: Secondary | ICD-10-CM

## 2021-07-26 DIAGNOSIS — E7800 Pure hypercholesterolemia, unspecified: Secondary | ICD-10-CM | POA: Diagnosis not present

## 2021-07-26 DIAGNOSIS — D519 Vitamin B12 deficiency anemia, unspecified: Secondary | ICD-10-CM | POA: Diagnosis not present

## 2021-07-26 MED ORDER — CYANOCOBALAMIN 1000 MCG/ML IJ SOLN
1000.0000 ug | Freq: Once | INTRAMUSCULAR | Status: AC
  Administered 2021-07-26: 1000 ug via INTRAMUSCULAR

## 2021-07-26 NOTE — Assessment & Plan Note (Signed)
Check Vit D level 

## 2021-07-26 NOTE — Assessment & Plan Note (Signed)
Well controlled Continue zestoretic 10-12.5 mg Recheck CMP F/u in 6 months

## 2021-07-26 NOTE — Progress Notes (Signed)
Annual Wellness Visit     Patient: Michaela Weber, Female    DOB: 1946-01-05, 75 y.o.   MRN: [id number] Visit Date: 07/26/2021  Today's Provider: Lavon Paganini, MD   Chief Complaint  Patient presents with   Medicare Wellness   Subjective    Michaela Weber is a 75 y.o. female who presents today for her Annual Wellness Visit. She reports consuming a general diet. The patient does not participate in regular exercise at present. She generally feels well. She reports sleeping fairly well. She does not have additional problems to discuss today.    Medications: Outpatient Medications Prior to Visit  Medication Sig   aspirin 81 MG EC tablet Take 81 mg by mouth daily.   clotrimazole-betamethasone (LOTRISONE) cream Apply 1 application topically daily.   Cyanocobalamin (B-12 COMPLIANCE INJECTION) 1000 MCG/ML KIT Inject 1 mL as directed every 30 (thirty) days. (Patient taking differently: Inject 1 mL as directed.)   levothyroxine (SYNTHROID) 112 MCG tablet TAKE 1 TABLET(112 MCG) BY MOUTH DAILY   lisinopril-hydrochlorothiazide (ZESTORETIC) 10-12.5 MG tablet TAKE 1 TABLET BY MOUTH DAILY   VITAMIN D, CHOLECALCIFEROL, PO Take 2,000 Units by mouth daily.   No facility-administered medications prior to visit.    Allergies  Allergen Reactions   Codeine Nausea And Vomiting    Patient Care Team: Virginia Crews, MD as PCP - [facility] [place]  Review of Systems  Constitutional: Negative.   HENT: Negative.    Eyes: Negative.   Respiratory: Negative.    Cardiovascular: Negative.   Gastrointestinal: Negative.   Endocrine: Negative.   Genitourinary: Negative.   Musculoskeletal: Negative.   Skin: Negative.   Allergic/Immunologic: Negative.   Neurological: Negative.   Hematological: Negative.   Psychiatric/Behavioral: Negative.     Last metabolic panel Lab Results  Component Value Date   GLUCOSE 113 (H) 01/20/2021    NA 142 01/20/2021   K 4.2 01/20/2021   CL 99 01/20/2021   CO2 26 01/20/2021   BUN 10 01/20/2021   CREATININE 0.70 01/20/2021   EGFR 91 01/20/2021   CALCIUM 10.1 01/20/2021   PROT 7.4 01/20/2021   ALBUMIN 4.8 (H) 01/20/2021   LABGLOB 2.6 01/20/2021   AGRATIO 1.8 01/20/2021   BILITOT 0.4 01/20/2021   ALKPHOS 117 01/20/2021   AST 11 01/20/2021   ALT 9 01/20/2021   Last lipids Lab Results  Component Value Date   CHOL 213 (H) 07/20/2020   HDL 59 07/20/2020   LDLCALC 136 (H) 07/20/2020   TRIG 103 07/20/2020   CHOLHDL 3.6 07/20/2020   Last vitamin D Lab Results  Component Value Date   VD25OH 47.1 01/20/2021   Last vitamin B12 and Folate Lab Results  Component Value Date   VITAMINB12 274 02/05/2020        Objective    Vitals: BP 131/71 (BP Location: Right Arm, Patient Position: Sitting, Cuff Size: Large)   Pulse 90   Temp 97.7 F (36.5 C) (Temporal)   Resp 16   Ht 5' 2" (1.575 m)   Wt 139 lb 14.4 oz (63.5 kg)   SpO2 98%   BMI 25.59 kg/m     Physical Exam Vitals reviewed.  Constitutional:      General: She is not in acute distress.    Appearance: Normal appearance. She is not ill-appearing or toxic-appearing.  HENT:     Head: Normocephalic and atraumatic.     Right Ear: Tympanic membrane, ear canal and external  ear normal.     Left Ear: Tympanic membrane, ear canal and external ear normal.     Nose: Nose normal.     Mouth/Throat:     Mouth: Mucous membranes are moist.     Pharynx: Oropharynx is clear. No oropharyngeal exudate or posterior oropharyngeal erythema.  Eyes:     General: No scleral icterus.    Extraocular Movements: Extraocular movements intact.     Conjunctiva/sclera: Conjunctivae normal.     Pupils: Pupils are equal, round, and reactive to light.  Cardiovascular:     Rate and Rhythm: Normal rate and regular rhythm.     Pulses: Normal pulses.     Heart sounds: Normal heart sounds. No murmur heard.   No friction rub. No gallop.   Pulmonary:     Effort: Pulmonary effort is normal. No respiratory distress.     Breath sounds: Normal breath sounds. No wheezing or rhonchi.  Chest:     Chest wall: No tenderness.  Abdominal:     General: Abdomen is flat. Bowel sounds are normal. There is no distension.     Palpations: Abdomen is soft.     Tenderness: There is no abdominal tenderness.  Musculoskeletal:        General: Normal range of motion.     Cervical back: Normal range of motion and neck supple.     Right lower leg: No edema.     Left lower leg: No edema.  Skin:    General: Skin is warm and dry.     Capillary Refill: Capillary refill takes less than 2 seconds.     Findings: No lesion or rash.  Neurological:     General: No focal deficit present.     Mental Status: She is alert and oriented to person, place, and time. Mental status is at baseline.  Psychiatric:        Mood and Affect: Mood normal.     Most recent functional status assessment: In your present state of health, do you have any difficulty performing the following activities: 07/26/2021  Hearing? N  Vision? N  Difficulty concentrating or making decisions? N  Walking or climbing stairs? N  Dressing or bathing? N  Doing errands, shopping? N  Some recent data might be hidden   Most recent fall risk assessment: Fall Risk  07/26/2021  Falls in the past year? 0  Number falls in past yr: 0  Injury with Fall? 0  Risk for fall due to : No Fall Risks  Follow up Falls evaluation completed    Most recent depression screenings: PHQ 2/9 Scores 07/26/2021 07/20/2020  PHQ - 2 Score 0 0  PHQ- 9 Score 0 -   Most recent cognitive screening: 6CIT Screen 07/26/2021  What Year? 0 points  What month? 0 points  What time? 0 points  Count back from 20 0 points  Months in reverse 0 points  Repeat phrase 0 points  Total Score 0   Most recent Audit-C alcohol use screening Alcohol Use Disorder Test (AUDIT) 07/26/2021  1. How often do you have a drink  containing alcohol? 0  2. How many drinks containing alcohol do you have on a typical day when you are drinking? 0  3. How often do you have six or more drinks on one occasion? 0  AUDIT-C Score 0  Alcohol Brief Interventions/Follow-up -   A score of 3 or more in women, and 4 or more in men indicates increased risk for alcohol abuse, EXCEPT if  all of the points are from question 1   No results found for any visits on 07/26/21.  Assessment & Plan     Annual wellness visit done today including the all of the following: Reviewed patient's Family Medical History Reviewed and updated list of patient's medical providers Assessment of cognitive impairment was done Assessed patient's functional ability Established a written schedule for health screening [place] Completed and Reviewed  Exercise Activities and Dietary recommendations  Goals      DIET - INCREASE WATER INTAKE     Recommend increasing water intake to 4-6 glasses a day.      Prevent falls     Recommend to remove any items from the home that may cause slips or trips.        Immunization History  Administered Date(s) Administered   Fluad Quad(high Dose 65+) 06/16/2019   Influenza, High Dose Seasonal PF 07/18/2016, 07/19/2018   Influenza-Unspecified 06/22/2014   Pneumococcal Conjugate-13 06/22/2014   Pneumococcal Polysaccharide-23 11/24/2011    Health Maintenance  Topic Date Due   Zoster Vaccines- Shingrix (1 of 2) Never done   DEXA SCAN  02/23/2020   COLONOSCOPY (Pts 45-37yr Insurance coverage will need to be confirmed)  04/07/2021   COVID-19 Vaccine (1) 08/11/2021 (Originally 09/19/1946)   INFLUENZA VACCINE  12/23/2021 (Originally 04/25/2021)   TETANUS/TDAP  09/25/2026 (Originally 03/20/1965)   Pneumonia Vaccine 75 Years old  Completed   Hepatitis C Screening  Completed   HPV VACCINES  Aged Out     Discussed health benefits of physical activity, and encouraged her to engage in regular  exercise appropriate for her age and condition.    Problem List Items Addressed This Visit       Cardiovascular and Mediastinum   Essential hypertension    Well controlled Continue zestoretic 10-12.5 mg Recheck CMP F/u in 6 months      Relevant Orders   Comprehensive metabolic panel     Endocrine   Adult hypothyroidism    Well controlled Continue synthroid 112 mcg Recheck TSH      Relevant Orders   TSH     Musculoskeletal and Integument   Osteopenia    Check Vit D level      Relevant Orders   VITAMIN D 25 Hydroxy (Vit-D Deficiency, Fractures)     Other   Vitamin D deficiency   Relevant Orders   VITAMIN D 25 Hydroxy (Vit-D Deficiency, Fractures)   HLD (hyperlipidemia)   Relevant Orders   Comprehensive metabolic panel   Lipid panel   Megaloblastic anemia due to B12 deficiency   Relevant Orders   B12   CBC   Pre-diabetes   Relevant Orders   Hemoglobin A1c   History of adenomatous polyp of colon   Relevant Orders   Ambulatory referral to Gastroenterology   B12 deficiency   Relevant Orders   B12   Encounter for Medicare annual wellness exam - Primary   Other Visit Diagnoses     Encounter for annual physical exam       Relevant Orders   Comprehensive metabolic panel   Lipid panel   VITAMIN D 25 Hydroxy (Vit-D Deficiency, Fractures)   B12   TSH   Hemoglobin A1c   CBC        Return in about 6 months (around 01/23/2022) for chronic disease f/u.    ENelva Nay Medical Student 07/26/2021, 10:19 AM   Patient seen along with MS3 student ENelva Nay I personally evaluated this patient along  with the student, and verified all aspects of the history, physical exam, and medical decision making as documented by the student. I agree with the student's documentation and have made all necessary edits.  Bac[place]alupo, Dionne Bucy, MD, MPH [facility]

## 2021-07-26 NOTE — Assessment & Plan Note (Signed)
Well controlled Continue synthroid 112 mcg Recheck TSH

## 2021-07-27 LAB — LIPID PANEL
Chol/HDL Ratio: 3.8 ratio (ref 0.0–4.4)
Cholesterol, Total: 225 mg/dL — ABNORMAL HIGH (ref 100–199)
HDL: 59 mg/dL (ref >39)
LDL Chol Calc (NIH): 147 mg/dL — ABNORMAL HIGH (ref 0–99)
Triglycerides: 105 mg/dL (ref 0–149)
VLDL Cholesterol Cal: 19 mg/dL (ref 5–40)

## 2021-07-27 LAB — CBC
Hematocrit: 42.3 % (ref 34.0–46.6)
Hemoglobin: 13.7 g/dL (ref 11.1–15.9)
MCH: 25.5 pg — ABNORMAL LOW (ref 26.6–33.0)
MCHC: 32.4 g/dL (ref 31.5–35.7)
MCV: 79 fL (ref 79–97)
Platelets: 366 10*3/uL (ref 150–450)
RBC: 5.37 x10E6/uL — ABNORMAL HIGH (ref 3.77–5.28)
RDW: 13.8 % (ref 11.7–15.4)
WBC: 8.7 10*3/uL (ref 3.4–10.8)

## 2021-07-27 LAB — COMPREHENSIVE METABOLIC PANEL
ALT: 12 IU/L (ref 0–32)
AST: 16 IU/L (ref 0–40)
Albumin/Globulin Ratio: 1.8 (ref 1.2–2.2)
Albumin: 4.7 g/dL (ref 3.7–4.7)
Alkaline Phosphatase: 117 IU/L (ref 44–121)
BUN/Creatinine Ratio: 24 (ref 12–28)
BUN: 17 mg/dL (ref 8–27)
Bilirubin Total: 0.3 mg/dL (ref 0.0–1.2)
CO2: 28 mmol/L (ref 20–29)
Calcium: 10 mg/dL (ref 8.7–10.3)
Chloride: 100 mmol/L (ref 96–106)
Creatinine, Ser: 0.7 mg/dL (ref 0.57–1.00)
Globulin, Total: 2.6 g/dL (ref 1.5–4.5)
Glucose: 109 mg/dL — ABNORMAL HIGH (ref 70–99)
Potassium: 4.1 mmol/L (ref 3.5–5.2)
Sodium: 142 mmol/L (ref 134–144)
Total Protein: 7.3 g/dL (ref 6.0–8.5)
eGFR: 90 mL/min/{1.73_m2} (ref >59)

## 2021-07-27 LAB — VITAMIN B12: Vitamin B-12: >2000 pg/mL — ABNORMAL HIGH (ref 232–1245)

## 2021-07-27 LAB — HEMOGLOBIN A1C
Est. average glucose Bld gHb Est-mCnc: 137 mg/dL
Hgb A1c MFr Bld: 6.4 % — ABNORMAL HIGH (ref 4.8–5.6)

## 2021-07-27 LAB — VITAMIN D 25 HYDROXY (VIT D DEFICIENCY, FRACTURES): Vit D, 25-Hydroxy: 46.3 ng/mL (ref 30.0–100.0)

## 2021-07-27 LAB — TSH: TSH: 1.32 u[IU]/mL (ref 0.450–4.500)

## 2021-08-10 ENCOUNTER — Telehealth: Payer: Self-pay

## 2021-08-10 NOTE — Telephone Encounter (Signed)
Referral is in the workque.

## 2021-08-10 NOTE — Telephone Encounter (Signed)
Patient is ready to schedule procedure. Clinical staff will follow up with patient. Call patient back on 08/11/2021, per patient

## 2021-08-15 ENCOUNTER — Telehealth: Payer: Self-pay

## 2021-08-15 NOTE — Telephone Encounter (Signed)
Patient is ready to schedule procedure. Clinical staff will follow up with patient. °

## 2021-08-15 NOTE — Telephone Encounter (Signed)
Procedure has been scheduled per Almyra Free.

## 2021-08-21 ENCOUNTER — Other Ambulatory Visit: Payer: Self-pay

## 2021-08-21 MED ORDER — NA SULFATE-K SULFATE-MG SULF 17.5-3.13-1.6 GM/177ML PO SOLN: 1.0000 | ORAL | 0 refills | Status: DC

## 2021-08-24 ENCOUNTER — Other Ambulatory Visit: Payer: Self-pay

## 2021-08-24 ENCOUNTER — Ambulatory Visit (INDEPENDENT_AMBULATORY_CARE_PROVIDER_SITE_OTHER): Payer: Medicare Other

## 2021-08-24 DIAGNOSIS — E538 Deficiency of other specified B group vitamins: Secondary | ICD-10-CM | POA: Diagnosis not present

## 2021-08-24 MED ORDER — CYANOCOBALAMIN 1000 MCG/ML IJ SOLN
1000.0000 ug | Freq: Once | INTRAMUSCULAR | Status: AC
Start: 2021-08-24 — End: 2021-08-24
  Administered 2021-08-24: 1000 ug via INTRAMUSCULAR

## 2021-09-21 ENCOUNTER — Other Ambulatory Visit: Payer: Self-pay

## 2021-09-21 ENCOUNTER — Ambulatory Visit (INDEPENDENT_AMBULATORY_CARE_PROVIDER_SITE_OTHER): Payer: Medicare Other

## 2021-09-21 DIAGNOSIS — E538 Deficiency of other specified B group vitamins: Secondary | ICD-10-CM | POA: Diagnosis not present

## 2021-09-21 MED ORDER — CYANOCOBALAMIN 1000 MCG/ML IJ SOLN
1000.0000 ug | Freq: Once | INTRAMUSCULAR | Status: AC
Start: 2021-09-21 — End: 2021-09-21
  Administered 2021-09-21: 1000 ug via INTRAMUSCULAR

## 2021-09-22 ENCOUNTER — Encounter: Payer: Self-pay | Admitting: Gastroenterology

## 2021-10-03 ENCOUNTER — Other Ambulatory Visit: Payer: Self-pay | Admitting: Family Medicine

## 2021-10-03 DIAGNOSIS — I10 Essential (primary) hypertension: Secondary | ICD-10-CM

## 2021-10-03 NOTE — Telephone Encounter (Signed)
Requested Prescriptions  Pending Prescriptions Disp Refills   lisinopril-hydrochlorothiazide (ZESTORETIC) 10-12.5 MG tablet [Pharmacy Med Name: LISINOPRIL-HCTZ 10/12.5MG  TABLETS] 90 tablet 1    Sig: TAKE 1 TABLET BY MOUTH DAILY     Cardiovascular:  ACEI + Diuretic Combos Passed - 10/03/2021  8:31 AM      Passed - Na in normal range and within 180 days    Sodium  Date Value Ref Range Status  07/26/2021 142 134 - 144 mmol/L Final         Passed - K in normal range and within 180 days    Potassium  Date Value Ref Range Status  07/26/2021 4.1 3.5 - 5.2 mmol/L Final         Passed - Cr in normal range and within 180 days    Creat  Date Value Ref Range Status  07/19/2017 0.63 0.60 - 0.93 mg/dL Final    Comment:    For patients >29 years of age, the reference limit for Creatinine is approximately 13% higher for people identified as African-American. .    Creatinine, Ser  Date Value Ref Range Status  07/26/2021 0.70 0.57 - 1.00 mg/dL Final         Passed - Ca in normal range and within 180 days    Calcium  Date Value Ref Range Status  07/26/2021 10.0 8.7 - 10.3 mg/dL Final         Passed - Patient is not pregnant      Passed - Last BP in normal range    BP Readings from Last 1 Encounters:  07/26/21 131/71         Passed - Valid encounter within last 6 months    Recent Outpatient Visits          2 months ago Encounter for [employer] annual wellness exam   [facility], Dionne Bucy, MD   8 months ago Essential hypertension   [facility] [place], Dionne Bucy, MD   1 year ago Encounter for annual physical exam   [facility], Dionne Bucy, MD   1 year ago Candidal intertrigo   [facility] [place], Dionne Bucy, MD   1 year ago Essential hypertension   [place], Dionne Bucy, MD      Future Appointments            In 3 months Bacigalupo, Dionne Bucy, MD [facility] [place]

## 2021-10-07 ENCOUNTER — Ambulatory Visit: Payer: Medicare Other | Admitting: Anesthesiology

## 2021-10-07 ENCOUNTER — Encounter
Admission: RE | Disposition: A | Discharge status: Home / Self Care | Payer: Self-pay | Source: Ambulatory Visit | Attending: Gastroenterology

## 2021-10-07 ENCOUNTER — Ambulatory Visit
Admission: RE | Admit: 2021-10-07 | Discharge: 2021-10-07 | Disposition: A | Discharge status: Home / Self Care | Payer: Medicare Other | Source: Ambulatory Visit | Attending: Gastroenterology | Admitting: Gastroenterology

## 2021-10-07 ENCOUNTER — Encounter: Payer: Self-pay | Admitting: Gastroenterology

## 2021-10-07 ENCOUNTER — Other Ambulatory Visit: Payer: Self-pay

## 2021-10-07 DIAGNOSIS — I10 Essential (primary) hypertension: Secondary | ICD-10-CM | POA: Insufficient documentation

## 2021-10-07 DIAGNOSIS — E039 Hypothyroidism, unspecified: Secondary | ICD-10-CM | POA: Insufficient documentation

## 2021-10-07 DIAGNOSIS — K640 First degree hemorrhoids: Secondary | ICD-10-CM | POA: Insufficient documentation

## 2021-10-07 DIAGNOSIS — Z1211 Encounter for screening for malignant neoplasm of colon: Secondary | ICD-10-CM | POA: Diagnosis not present

## 2021-10-07 DIAGNOSIS — K219 Gastro-esophageal reflux disease without esophagitis: Secondary | ICD-10-CM | POA: Insufficient documentation

## 2021-10-07 DIAGNOSIS — Z86010 Personal history of colonic polyps: Secondary | ICD-10-CM | POA: Diagnosis not present

## 2021-10-07 HISTORY — PX: COLONOSCOPY: SHX5424

## 2021-10-07 SURGERY — COLONOSCOPY
Anesthesia: General | Site: Rectum

## 2021-10-07 MED ORDER — LIDOCAINE HCL (CARDIAC) PF 100 MG/5ML IV SOSY
PREFILLED_SYRINGE | INTRAVENOUS | Status: DC | PRN
  Administered 2021-10-07: 40 mg via INTRAVENOUS

## 2021-10-07 MED ORDER — SODIUM CHLORIDE 0.9 % IV SOLN: INTRAVENOUS | Status: DC

## 2021-10-07 MED ORDER — LACTATED RINGERS IV SOLN: INTRAVENOUS | Status: DC

## 2021-10-07 MED ORDER — STERILE WATER FOR IRRIGATION IR SOLN
Status: DC | PRN
  Administered 2021-10-07: 1

## 2021-10-07 MED ORDER — PROPOFOL 10 MG/ML IV BOLUS
INTRAVENOUS | Status: DC | PRN
  Administered 2021-10-07: 70 mg via INTRAVENOUS
  Administered 2021-10-07: 50 mg via INTRAVENOUS
  Administered 2021-10-07: 70 mg via INTRAVENOUS

## 2021-10-07 SURGICAL SUPPLY — 22 items

## 2021-10-07 NOTE — Anesthesia Preprocedure Evaluation (Signed)
Anesthesia Evaluation    Airway Mallampati: II   Neck ROM: Full    Dental   Pulmonary neg pulmonary ROS,    Pulmonary exam normal        Cardiovascular hypertension, Normal cardiovascular exam     Neuro/Psych    GI/Hepatic GERD  ,  Endo/Other  Hypothyroidism   Renal/GU      Musculoskeletal   Abdominal   Peds  Hematology   Anesthesia Other Findings   Reproductive/Obstetrics                             Anesthesia Physical Anesthesia Plan  ASA: 2  Anesthesia Plan: General   Post-op Pain Management:    Induction:   PONV Risk Score and Plan:   Airway Management Planned: Natural Airway  Additional Equipment:   Intra-op Plan:   Post-operative Plan:   Informed Consent: I have reviewed the patients History and Physical, chart, labs and discussed the procedure including the risks, benefits and alternatives for the proposed anesthesia with the patient or authorized representative who has indicated his/her understanding and acceptance.       Plan Discussed with: CRNA and Anesthesiologist  Anesthesia Plan Comments:         Anesthesia Quick Evaluation

## 2021-10-07 NOTE — Anesthesia Postprocedure Evaluation (Signed)
Anesthesia Post Note  Patient: Michaela Weber  Procedure(s) Performed: COLONOSCOPY (Rectum)     Patient location during evaluation: PACU Anesthesia Type: General Level of consciousness: awake and alert Pain management: pain level controlled Vital Signs Assessment: post-procedure vital signs reviewed and stable Respiratory status: spontaneous breathing, nonlabored ventilation and respiratory function stable Cardiovascular status: blood pressure returned to baseline and stable Postop Assessment: no apparent nausea or vomiting Anesthetic complications: no   No notable events documented.  Wanda Plump Latasia Silberstein

## 2021-10-07 NOTE — H&P (Signed)
Michaela Lame, MD [address] Michaela Weber, [place] [address] Phone:[phone] Fax : [phone]  Primary Care Physician:  Michaela Crews, MD Primary Gastroenterologist:  Dr. Allen Weber  Pre-Procedure History & Physical: HPI:  Michaela Weber is a 76 y.o. female is here for an colonoscopy.   Past Medical History:  Diagnosis Date   Anemia    in past   Diastolic dysfunction    Hypertension    Hypothyroidism    Valvular disease    mild   Wears contact lenses     Past Surgical History:  Procedure Laterality Date   CESAREAN SECTION     x2    COLONOSCOPY  approx 2007   COLONOSCOPY WITH PROPOFOL N/A 04/07/2016   Procedure: COLONOSCOPY WITH PROPOFOL;  Surgeon: Michaela Lame, MD;  Location: [place];  Service: Endoscopy;  Laterality: N/A;   GANGLION CYST EXCISION     POLYPECTOMY  04/07/2016   Procedure: POLYPECTOMY;  Surgeon: Michaela Lame, MD;  Location: [place];  Service: Endoscopy;;   TONSILLECTOMY     TUBAL LIGATION     with 2nd c-sect    Prior to Admission medications   Medication Sig Start Date End Date Taking? Authorizing Provider  aspirin 81 MG EC tablet Take 81 mg by mouth daily.   Yes [provider]  Cyanocobalamin (B-12 COMPLIANCE INJECTION) 1000 MCG/ML KIT Inject 1 mL as directed every 30 (thirty) days. Patient taking differently: Inject 1 mL as directed. 09/13/16  Yes Birdie Sons, MD  levothyroxine (SYNTHROID) 112 MCG tablet TAKE 1 TABLET(112 MCG) BY MOUTH DAILY 03/19/21  Yes Bacigalupo, Dionne Bucy, MD  lisinopril-hydrochlorothiazide (ZESTORETIC) 10-12.5 MG tablet TAKE 1 TABLET BY MOUTH DAILY 10/03/21  Yes Bacigalupo, Dionne Bucy, MD  VITAMIN D, CHOLECALCIFEROL, PO Take 2,000 Units by mouth daily.   Yes [provider]  clotrimazole-betamethasone (LOTRISONE) cream Apply 1 application topically daily. Patient not taking: Reported on 09/22/2021 01/20/21   Michaela Crews, MD  Na Sulfate-K Sulfate-Mg Sulf (SUPREP  BOWEL PREP KIT) 17.5-3.13-1.6 GM/177ML SOLN Take 1 kit by mouth as directed. 08/21/21   Michaela Lame, MD    Allergies as of 08/15/2021 - Review Complete 07/26/2021  Allergen Reaction Noted   Codeine Nausea And Vomiting 06/16/2010    Family History  Problem Relation Age of Onset   Diabetes Father    Alzheimer's disease Mother    Healthy Brother    Breast cancer Neg Hx    Colon cancer Neg Hx    Ovarian cancer Neg Hx     Social History   Socioeconomic History   Marital status: Widowed    Spouse name: Michaela Weber   Number of children: 2   Years of education: 14   Highest education level: Associate degree: occupational, Hotel manager, or vocational program  Occupational History    Employer: RETIRED  Tobacco Use   Smoking status: Never   Smokeless tobacco: Never   Tobacco comments:    tobacco use - no  Vaping Use   Vaping Use: Never used  Substance and Sexual Activity   Alcohol use: No   Drug use: No   Sexual activity: Not on file  Other Topics Concern   Not on file  Social History Narrative   Not on file   Social Determinants of Health   Financial Resource Strain: Not on file  Food Insecurity: Not on file  Transportation Needs: Not on file  Physical Activity: Not on file  Stress: Not on file  Social Connections: Not on file  Intimate Partner Violence: Not on file    Review of Systems: See HPI, otherwise negative ROS  Physical Exam: BP (!) 157/95    Pulse (!) 121    Temp 97.7 F (36.5 C) (Temporal)    Ht _0  (1.575 m)    Wt 61.7 kg    SpO2 96%    BMI 24.87 kg/m  General:   Alert,  pleasant and cooperative in NAD Head:  Normocephalic and atraumatic. Neck:  Supple; no masses or thyromegaly. Lungs:  Clear throughout to auscultation.    Heart:  Regular rate and rhythm. Abdomen:  Soft, nontender and nondistended. Normal bowel sounds, without guarding, and without rebound.   Neurologic:  Alert and  oriented x4;  grossly normal  neurologically.  Impression/Plan: Michaela Weber is here for an colonoscopy to be performed for a history of adenomatous polyps on 2017   Risks, benefits, limitations, and alternatives regarding  colonoscopy have been reviewed with the patient.  Questions have been answered.  All parties agreeable.   Michaela Lame, MD  10/07/2021, 8:43 AM

## 2021-10-07 NOTE — Transfer of Care (Signed)
Immediate Anesthesia Transfer of Care Note  Patient: Michaela Weber  Procedure(s) Performed: COLONOSCOPY (Rectum)  Patient Location: PACU  Anesthesia Type: General  Level of Consciousness: awake, alert  and patient cooperative  Airway and Oxygen Therapy: Patient Spontanous Breathing and Patient connected to supplemental oxygen  Post-op Assessment: Post-op Vital signs reviewed, Patient's Cardiovascular Status Stable, Respiratory Function Stable, Patent Airway and No signs of Nausea or vomiting  Post-op Vital Signs: Reviewed and stable  Complications: No notable events documented.

## 2021-10-07 NOTE — Op Note (Signed)
[facility] Patient Name: Michaela Weber Procedure Date: 10/07/2021 8:43 AM MRN: [id number] Account #: [id number] Date of Birth: 02-26-1946 Admit Type: Outpatient Age: 76 Room: [facility] OR ROOM 01 Gender: Female Note Status: Finalized Instrument Name: [id number] Procedure:             Colonoscopy Indications:           High risk colon cancer surveillance: Personal history                         of colonic polyps Providers:             Lucilla Lame MD, MD Referring MD:          Dionne Bucy. Bacigalupo (Referring MD) Medicines:             Propofol per Anesthesia Complications:         No immediate complications. Procedure:             Pre-Anesthesia Assessment:                        - Prior to the procedure, a History and Physical was                         performed, and patient medications and allergies were                         reviewed. The patient's tolerance of previous                         anesthesia was also reviewed. The risks and benefits                         of the procedure and the sedation options and risks                         were discussed with the patient. All questions were                         answered, and informed consent was obtained. Prior                         Anticoagulants: The patient has taken no previous                         anticoagulant or antiplatelet agents. ASA Grade                         Assessment: II - A patient with mild systemic disease.                         After reviewing the risks and benefits, the patient                         was deemed in satisfactory condition to undergo the                         procedure.  After obtaining informed consent, the colonoscope was                         passed under direct vision. Throughout the procedure,                         the patient's blood pressure, pulse, and oxygen                         saturations were monitored  continuously. The                         Colonoscope was introduced through the anus and                         advanced to the the cecum, identified by appendiceal                         orifice and ileocecal valve. The colonoscopy was                         performed without difficulty. The patient tolerated                         the procedure well. The quality of the bowel                         preparation was excellent. Findings:      The perianal and digital rectal examinations were normal.      Non-bleeding internal hemorrhoids were found during retroflexion. The       hemorrhoids were Grade I (internal hemorrhoids that do not prolapse).      The exam was otherwise without abnormality. Impression:            - Non-bleeding internal hemorrhoids.                        - The examination was otherwise normal.                        - No specimens collected. Recommendation:        - Discharge patient to home.                        - Resume previous diet.                        - Continue present medications.                        - Repeat colonoscopy is not recommended for                         surveillance. Procedure Code(s):     --- Professional ---                        [phone], Colonoscopy, flexible; diagnostic, including                         collection of specimen(s) by brushing or washing, when  performed (separate procedure) Diagnosis Code(s):     --- Professional ---                        Z86.010, Personal history of colonic polyps                        K64.0, First degree hemorrhoids CPT copyright 2019 American Medical Association. All rights reserved. The codes documented in this report are preliminary and upon coder review may  be revised to meet current compliance requirements. Lucilla Lame MD, MD 10/07/2021 9:09:36 AM This report has been signed electronically. Number of Addenda: 0 Note Initiated On: 10/07/2021 8:43 AM Scope  Withdrawal Time: 0 hours 7 minutes 23 seconds  Total Procedure Duration: 0 hours 11 minutes 8 seconds  Estimated Blood Loss:  Estimated blood loss: none.      [facility]

## 2021-10-11 ENCOUNTER — Encounter: Payer: Self-pay | Admitting: Gastroenterology

## 2021-10-19 ENCOUNTER — Ambulatory Visit (INDEPENDENT_AMBULATORY_CARE_PROVIDER_SITE_OTHER): Payer: Medicare Other

## 2021-10-19 ENCOUNTER — Other Ambulatory Visit: Payer: Self-pay

## 2021-10-19 DIAGNOSIS — E538 Deficiency of other specified B group vitamins: Secondary | ICD-10-CM

## 2021-10-19 MED ORDER — CYANOCOBALAMIN 1000 MCG/ML IJ SOLN
1000.0000 ug | Freq: Once | INTRAMUSCULAR | Status: AC
  Administered 2021-10-19: 1000 ug via INTRAMUSCULAR

## 2021-11-16 ENCOUNTER — Ambulatory Visit (INDEPENDENT_AMBULATORY_CARE_PROVIDER_SITE_OTHER): Payer: Medicare Other

## 2021-11-16 ENCOUNTER — Other Ambulatory Visit: Payer: Self-pay

## 2021-11-16 DIAGNOSIS — E538 Deficiency of other specified B group vitamins: Secondary | ICD-10-CM

## 2021-11-16 MED ORDER — CYANOCOBALAMIN 1000 MCG/ML IJ SOLN
1000.0000 ug | Freq: Once | INTRAMUSCULAR | Status: AC
  Administered 2021-11-16: 1000 ug via INTRAMUSCULAR

## 2021-12-14 ENCOUNTER — Other Ambulatory Visit: Payer: Self-pay

## 2021-12-14 ENCOUNTER — Ambulatory Visit (INDEPENDENT_AMBULATORY_CARE_PROVIDER_SITE_OTHER): Payer: Medicare Other

## 2021-12-14 DIAGNOSIS — E538 Deficiency of other specified B group vitamins: Secondary | ICD-10-CM

## 2021-12-14 MED ORDER — CYANOCOBALAMIN 1000 MCG/ML IJ SOLN
1000.0000 ug | Freq: Once | INTRAMUSCULAR | Status: AC
  Administered 2021-12-14: 1000 ug via INTRAMUSCULAR

## 2021-12-22 ENCOUNTER — Other Ambulatory Visit: Payer: Self-pay | Admitting: Family Medicine

## 2021-12-22 DIAGNOSIS — E039 Hypothyroidism, unspecified: Secondary | ICD-10-CM

## 2022-01-18 ENCOUNTER — Ambulatory Visit (INDEPENDENT_AMBULATORY_CARE_PROVIDER_SITE_OTHER): Payer: Medicare Other

## 2022-01-18 DIAGNOSIS — E538 Deficiency of other specified B group vitamins: Secondary | ICD-10-CM

## 2022-01-18 MED ORDER — CYANOCOBALAMIN 1000 MCG/ML IJ SOLN
1000.0000 ug | Freq: Once | INTRAMUSCULAR | Status: AC
  Administered 2022-01-18: 1000 ug via INTRAMUSCULAR

## 2022-01-26 ENCOUNTER — Ambulatory Visit: Payer: Medicare Other | Admitting: Family Medicine

## 2022-01-30 ENCOUNTER — Ambulatory Visit: Payer: Medicare Other | Admitting: Family Medicine

## 2022-02-03 NOTE — Progress Notes (Signed)
? ?I,Sulibeya S Dimas,acting as a scribe for Lavon Paganini, MD.,have documented all relevant documentation on the behalf of Lavon Paganini, MD,as directed by  Lavon Paganini, MD while in the presence of Lavon Paganini, MD. ?  ? ? ?Established patient visit ? ? ?Patient: Michaela Weber   DOB: [birth year]   76 y.o. Female  MRN: [id number] ?Visit Date: 02/06/2022 ? ?Today's healthcare provider: Lavon Paganini, MD  ? ?Chief Complaint  ?Patient presents with  ? Hyperlipidemia  ? Prediabetes  ? Hypothyroidism  ? ?Subjective  ?  ?HPI  ?Hypertension, follow-up ? ?BP Readings from Last 3 Encounters:  ?02/06/22 121/74  ?10/07/21 127/88  ?07/26/21 131/71  ? Wt Readings from Last 3 Encounters:  ?02/06/22 137 lb (62.1 kg)  ?10/07/21 136 lb (61.7 kg)  ?07/26/21 139 lb 14.4 oz (63.5 kg)  ?  ? ?She was last seen for hypertension 6 months ago.  ?BP at that visit was 131/71. ?Management since that visit includes no changes. ?She reports excellent compliance with treatment. ?She is not having side effects.  ?She is exercising. ?She is not adherent to low salt diet.   ?Outside blood pressures are stable. ? ?She does not smoke. ? ?Use of agents associated with hypertension: none.  ? ?--------------------------------------------------------------------------------------------------- ?Hypothyroid, follow-up ? ?Lab Results  ?Component Value Date  ? TSH 1.320 07/26/2021  ? TSH 0.732 01/20/2021  ? TSH 0.897 07/20/2020  ? T4TOTAL 8.7 01/18/2017  ? ? ?Wt Readings from Last 3 Encounters:  ?02/06/22 137 lb (62.1 kg)  ?10/07/21 136 lb (61.7 kg)  ?07/26/21 139 lb 14.4 oz (63.5 kg)  ? ? ?She was last seen for hypothyroid 6 months ago.  ?Management since that visit includes no changes. ?She reports excellent compliance with treatment. ?She is not having side effects.  ? ?Symptoms: ?No change in energy level No constipation  ?No diarrhea Yes heat / cold intolerance  ?No nervousness No palpitations  ?No weight changes    ? ?----------------------------------------------------------------------------------------- ?Prediabetes, Follow-up ? ?Lab Results  ?Component Value Date  ? HGBA1C 6.4 (H) 07/26/2021  ? HGBA1C 6.3 (H) 01/20/2021  ? HGBA1C 6.2 (H) 07/20/2020  ? GLUCOSE 109 (H) 07/26/2021  ? GLUCOSE 113 (H) 01/20/2021  ? GLUCOSE 93 07/20/2020  ? ? ?Last seen for for this6 months ago.  ?Management since that visit includes no changes. ?Current symptoms include none and have been stable. ? ?Prior visit with dietician: no ?Current diet: in general, a "healthy" diet   ?Current exercise: aerobics ? ?Pertinent Labs: ?   ?Component Value Date/Time  ? CHOL 225 (H) 07/26/2021 1031  ? TRIG 105 07/26/2021 1031  ? CHOLHDL 3.8 07/26/2021 1031  ? CREATININE 0.70 07/26/2021 1031  ? CREATININE 0.63 07/19/2017 0903  ? ? ?Wt Readings from Last 3 Encounters:  ?02/06/22 137 lb (62.1 kg)  ?10/07/21 136 lb (61.7 kg)  ?07/26/21 139 lb 14.4 oz (63.5 kg)  ? ? ?----------------------------------------------------------------------------------------- ? ? ?Medications: ?Outpatient Medications Prior to Visit  ?Medication Sig  ? aspirin 81 MG EC tablet Take 81 mg by mouth daily.  ? levothyroxine (SYNTHROID) 112 MCG tablet TAKE 1 TABLET(112 MCG) BY MOUTH DAILY  ? lisinopril-hydrochlorothiazide (ZESTORETIC) 10-12.5 MG tablet TAKE 1 TABLET BY MOUTH DAILY  ? Na Sulfate-K Sulfate-Mg Sulf (SUPREP BOWEL PREP KIT) 17.5-3.13-1.6 GM/177ML SOLN Take 1 kit by mouth as directed.  ? VITAMIN D, CHOLECALCIFEROL, PO Take 2,000 Units by mouth daily.  ? [DISCONTINUED] clotrimazole-betamethasone (LOTRISONE) cream Apply 1 application topically daily. (Patient not taking: Reported on 09/22/2021)  ? [  DISCONTINUED] Cyanocobalamin (B-12 COMPLIANCE INJECTION) 1000 MCG/ML KIT Inject 1 mL as directed every 30 (thirty) days. (Patient taking differently: Inject 1 mL as directed.)  ? ?No facility-administered medications prior to visit.  ? ? ?Review of Systems  ?Constitutional:  Negative for  appetite change and fatigue.  ?Respiratory:  Negative for shortness of breath.   ?Cardiovascular:  Negative for chest pain, palpitations and leg swelling.  ?Endocrine: Positive for cold intolerance.  ? ?  ?  Objective  ?  ?BP 121/74 (BP Location: Left Arm, Patient Position: Sitting, Cuff Size: Large)   Pulse 81   Temp 97.8 ?F (36.6 ?C) (Oral)   Resp 16   Ht _0  (1.575 m)   Wt 137 lb (62.1 kg)   BMI 25.06 kg/m?  ?BP Readings from Last 3 Encounters:  ?02/06/22 121/74  ?10/07/21 127/88  ?07/26/21 131/71  ? ?Wt Readings from Last 3 Encounters:  ?02/06/22 137 lb (62.1 kg)  ?10/07/21 136 lb (61.7 kg)  ?07/26/21 139 lb 14.4 oz (63.5 kg)  ? ?  ? ?Physical Exam ?Vitals reviewed.  ?Constitutional:   ?   General: She is not in acute distress. ?   Appearance: Normal appearance. She is well-developed. She is not diaphoretic.  ?HENT:  ?   Head: Normocephalic and atraumatic.  ?Eyes:  ?   General: No scleral icterus. ?   Conjunctiva/sclera: Conjunctivae normal.  ?Neck:  ?   Thyroid: No thyromegaly.  ?Cardiovascular:  ?   Rate and Rhythm: Normal rate and regular rhythm.  ?   Pulses: Normal pulses.  ?   Heart sounds: Normal heart sounds. No murmur heard. ?Pulmonary:  ?   Effort: Pulmonary effort is normal. No respiratory distress.  ?   Breath sounds: Normal breath sounds. No wheezing, rhonchi or rales.  ?Musculoskeletal:  ?   Cervical back: Neck supple.  ?   Right lower leg: No edema.  ?   Left lower leg: No edema.  ?Lymphadenopathy:  ?   Cervical: No cervical adenopathy.  ?Skin: ?   General: Skin is warm and dry.  ?   Findings: No rash.  ?Neurological:  ?   Mental Status: She is alert and oriented to person, place, and time. Mental status is at baseline.  ?Psychiatric:     ?   Mood and Affect: Mood normal.     ?   Behavior: Behavior normal.  ?  ? ? ?No results found for any visits on 02/06/22. ? Assessment & Plan  ?  ? ?Problem List Items Addressed This Visit   ? ?  ? Cardiovascular and Mediastinum  ? Essential hypertension  - Primary  ?  Well controlled ?Continue current medications ?Recheck metabolic panel ?F/u in 6 months  ?  ?  ? Relevant Orders  ? Comprehensive metabolic panel  ?  ? Endocrine  ? Adult hypothyroidism  ?  Previously well controlled ?Continue Synthroid at current dose  ?Recheck TSH and adjust Synthroid as indicated   ?  ?  ? Relevant Orders  ? TSH  ?  ? Other  ? HLD (hyperlipidemia)  ?  Reviewed last lipid panel ?Not currently on a statin ?Recheck FLP and CMP ?Discussed diet and exercise  ?  ?  ? Relevant Orders  ? Lipid panel  ? Pre-diabetes  ?  Recommend low carb diet ?Recheck A1c  ?  ?  ? Relevant Orders  ? Hemoglobin A1c  ? Overweight  ?  Discussed importance of healthy weight management ?Discussed diet  and exercise  ?  ?  ? B12 deficiency  ? Relevant Orders  ? B12  ?  ? ?Return in about 6 months (around 08/09/2022) for CPE.  ?   ? ?I, Lavon Paganini, MD, have reviewed all documentation for this visit. The documentation on 02/06/22 for the exam, diagnosis, procedures, and orders are all accurate and complete. ? ? ?Virginia Crews, MD, MPH ?[place] ?[facility]   ?

## 2022-02-06 ENCOUNTER — Ambulatory Visit (INDEPENDENT_AMBULATORY_CARE_PROVIDER_SITE_OTHER): Payer: Medicare Other | Admitting: Family Medicine

## 2022-02-06 ENCOUNTER — Encounter: Payer: Self-pay | Admitting: Family Medicine

## 2022-02-06 VITALS — BP 121/74 | HR 81 | Temp 97.8°F | Resp 16 | Ht 62.0 in | Wt 137.0 lb

## 2022-02-06 DIAGNOSIS — E663 Overweight: Secondary | ICD-10-CM

## 2022-02-06 DIAGNOSIS — E7800 Pure hypercholesterolemia, unspecified: Secondary | ICD-10-CM

## 2022-02-06 DIAGNOSIS — E039 Hypothyroidism, unspecified: Secondary | ICD-10-CM | POA: Diagnosis not present

## 2022-02-06 DIAGNOSIS — R7303 Prediabetes: Secondary | ICD-10-CM

## 2022-02-06 DIAGNOSIS — E538 Deficiency of other specified B group vitamins: Secondary | ICD-10-CM

## 2022-02-06 DIAGNOSIS — I10 Essential (primary) hypertension: Secondary | ICD-10-CM | POA: Diagnosis not present

## 2022-02-06 NOTE — Assessment & Plan Note (Signed)
Discussed importance of healthy weight management Discussed diet and exercise  

## 2022-02-06 NOTE — Assessment & Plan Note (Signed)
Well controlled Continue current medications Recheck metabolic panel F/u in 6 months  

## 2022-02-06 NOTE — Assessment & Plan Note (Signed)
Previously well controlled Continue Synthroid at current dose  Recheck TSH and adjust Synthroid as indicated   

## 2022-02-06 NOTE — Assessment & Plan Note (Signed)
Reviewed last lipid panel Not currently on a statin Recheck FLP and CMP Discussed diet and exercise  

## 2022-02-06 NOTE — Assessment & Plan Note (Signed)
Recommend low carb diet °Recheck A1c  °

## 2022-02-07 LAB — COMPREHENSIVE METABOLIC PANEL
ALT: 13 IU/L (ref 0–32)
AST: 16 IU/L (ref 0–40)
Albumin/Globulin Ratio: 1.9 (ref 1.2–2.2)
Albumin: 4.7 g/dL (ref 3.7–4.7)
Alkaline Phosphatase: 118 IU/L (ref 44–121)
BUN/Creatinine Ratio: 26 (ref 12–28)
BUN: 17 mg/dL (ref 8–27)
Bilirubin Total: 0.3 mg/dL (ref 0.0–1.2)
CO2: 25 mmol/L (ref 20–29)
Calcium: 10.1 mg/dL (ref 8.7–10.3)
Chloride: 99 mmol/L (ref 96–106)
Creatinine, Ser: 0.66 mg/dL (ref 0.57–1.00)
Globulin, Total: 2.5 g/dL (ref 1.5–4.5)
Glucose: 112 mg/dL — ABNORMAL HIGH (ref 70–99)
Potassium: 4.3 mmol/L (ref 3.5–5.2)
Sodium: 140 mmol/L (ref 134–144)
Total Protein: 7.2 g/dL (ref 6.0–8.5)
eGFR: 91 mL/min/{1.73_m2} (ref >59)

## 2022-02-07 LAB — TSH: TSH: 0.526 u[IU]/mL (ref 0.450–4.500)

## 2022-02-07 LAB — LIPID PANEL
Chol/HDL Ratio: 3.6 ratio (ref 0.0–4.4)
Cholesterol, Total: 222 mg/dL — ABNORMAL HIGH (ref 100–199)
HDL: 61 mg/dL (ref >39)
LDL Chol Calc (NIH): 147 mg/dL — ABNORMAL HIGH (ref 0–99)
Triglycerides: 77 mg/dL (ref 0–149)
VLDL Cholesterol Cal: 14 mg/dL (ref 5–40)

## 2022-02-07 LAB — VITAMIN B12: Vitamin B-12: 474 pg/mL (ref 232–1245)

## 2022-02-07 LAB — HEMOGLOBIN A1C
Est. average glucose Bld gHb Est-mCnc: 137 mg/dL
Hgb A1c MFr Bld: 6.4 % — ABNORMAL HIGH (ref 4.8–5.6)

## 2022-02-14 IMAGING — MG MM DIGITAL SCREENING BILAT W/ TOMO AND CAD
8 series · 8 of 24 positions shown · non-contrast
Comparison: Previous exam(s).

CLINICAL DATA: Screening.

EXAM:
DIGITAL SCREENING BILATERAL MAMMOGRAM WITH TOMOSYNTHESIS AND CAD
TECHNIQUE: Bilateral screening digital craniocaudal and mediolateral oblique
mammograms were obtained. Bilateral screening digital breast
tomosynthesis was performed. The images were evaluated with
computer-aided detection.

[R CC synth-2D]
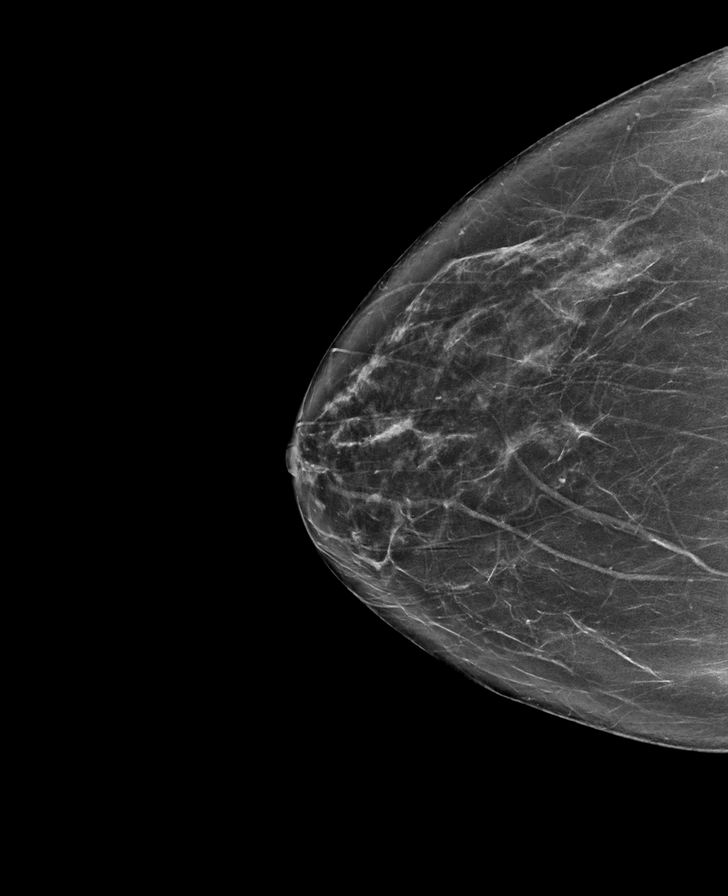

[R MLO synth-2D]
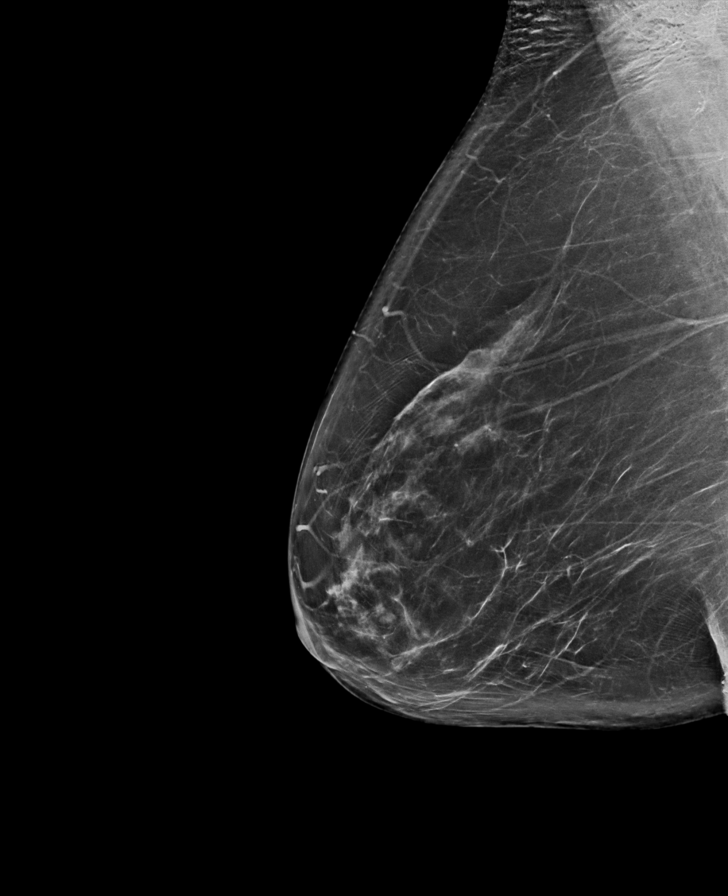

[L CC synth-2D]
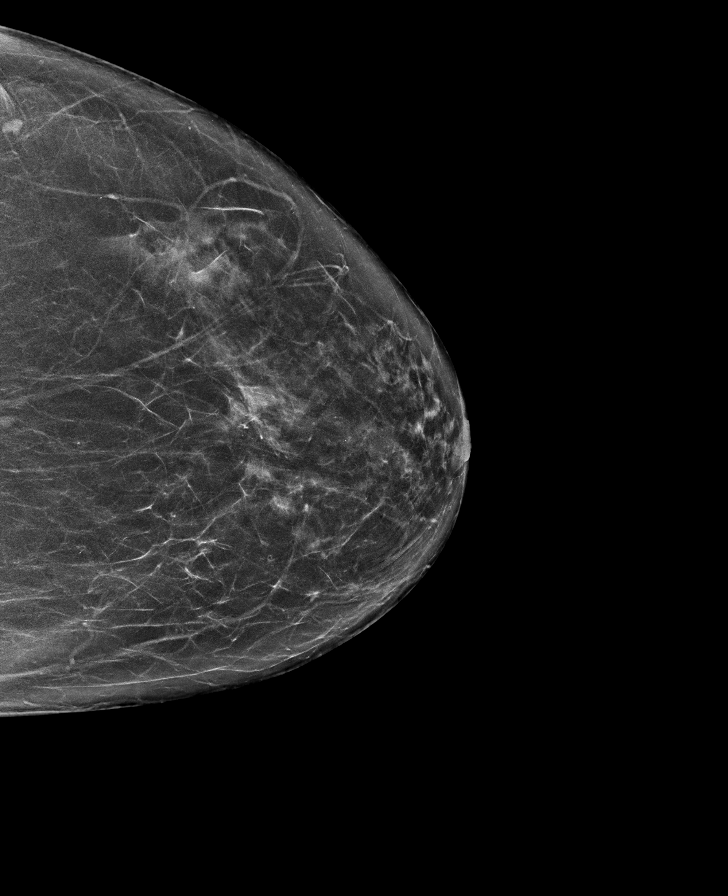

[L MLO synth-2D]
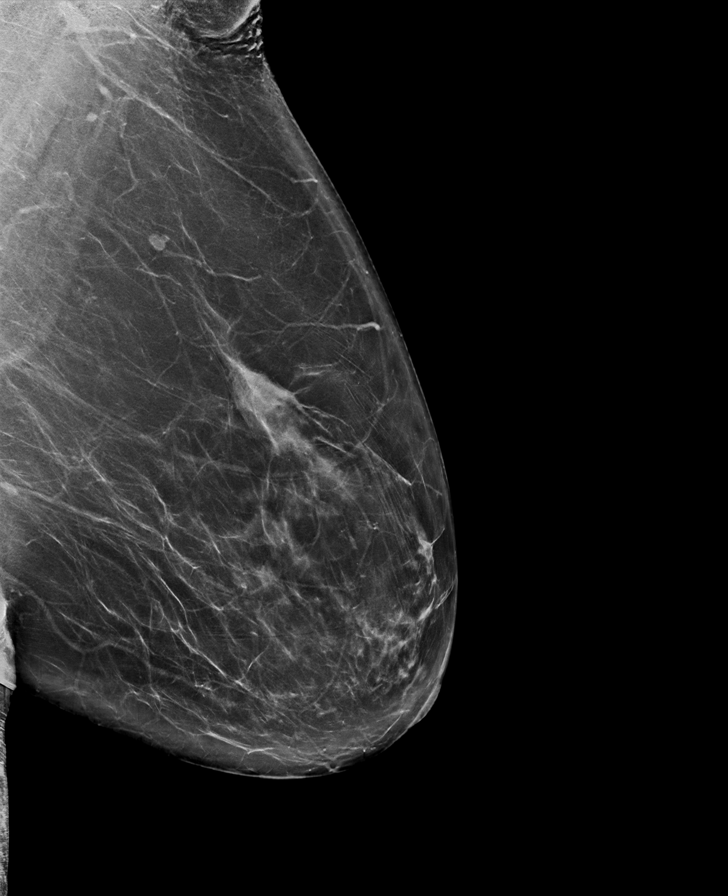

[R CC tomo · tomo slice 37/73.0]
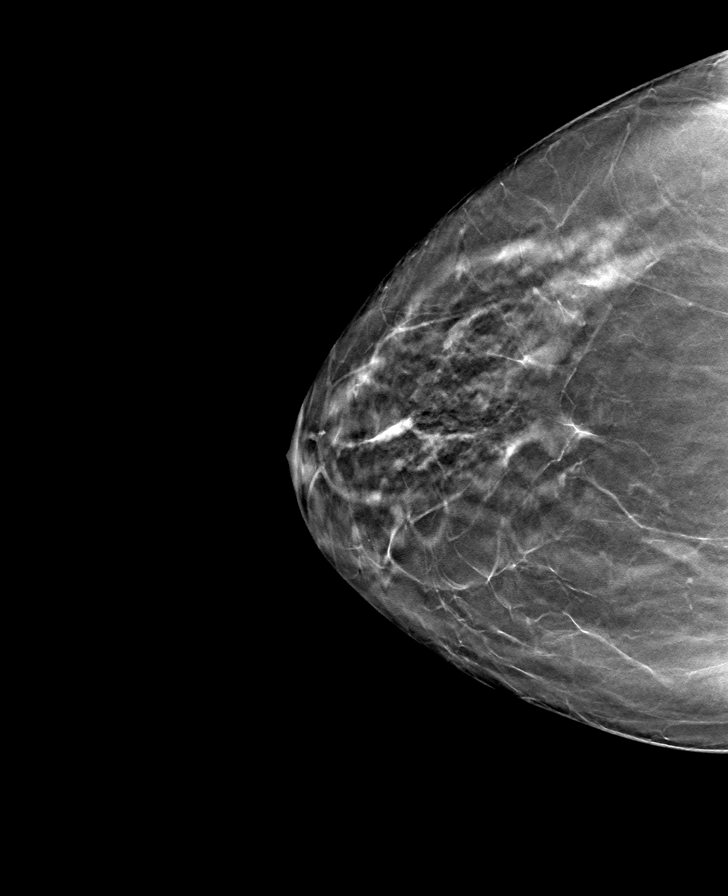

[R MLO tomo · tomo slice 43/84.0]
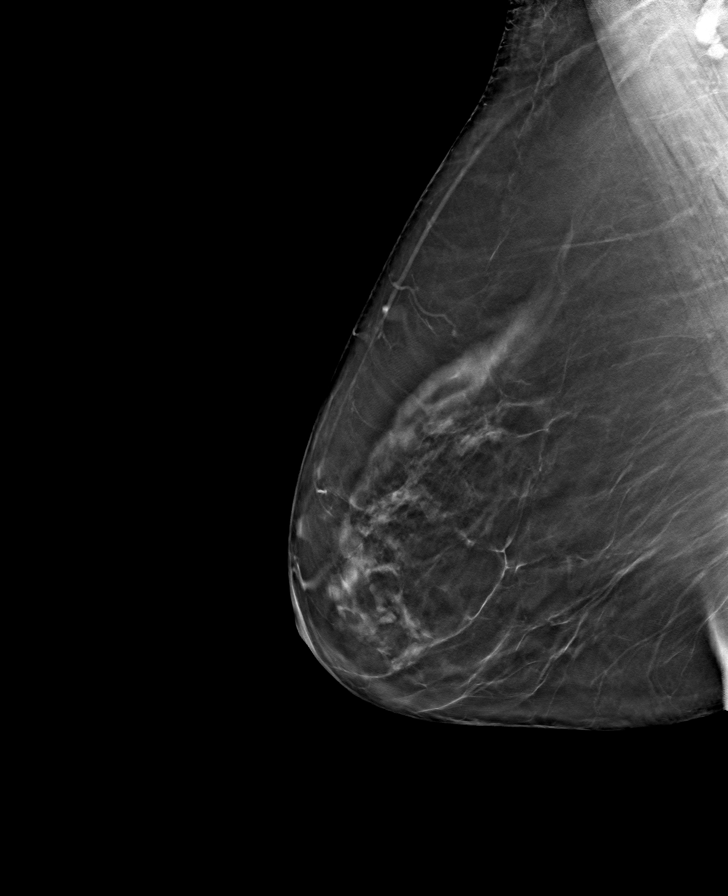

[L MLO tomo · tomo slice 43/85.0]
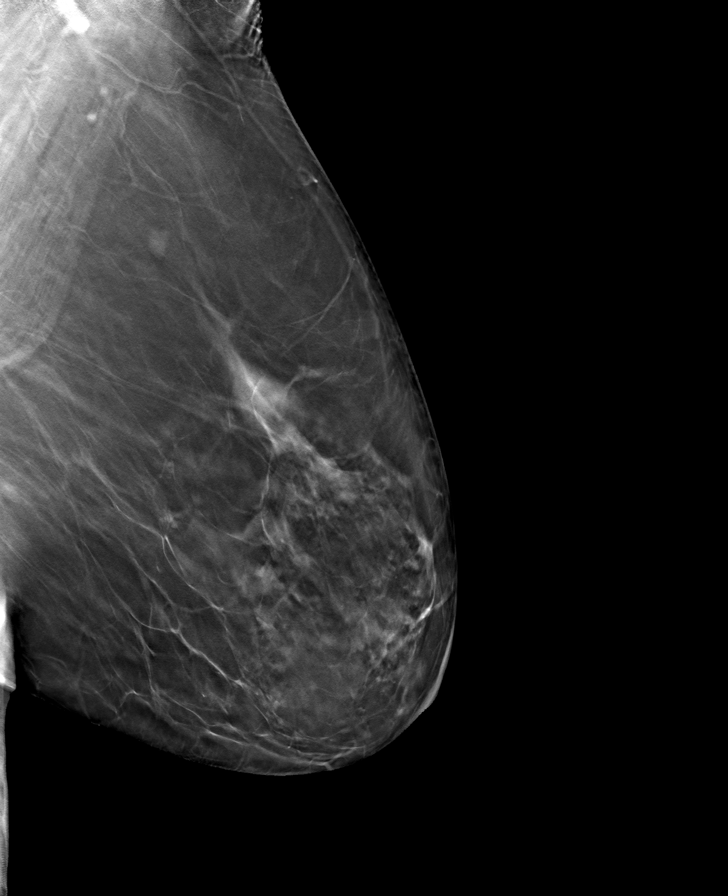

[L CC tomo · tomo slice 34/67.0]
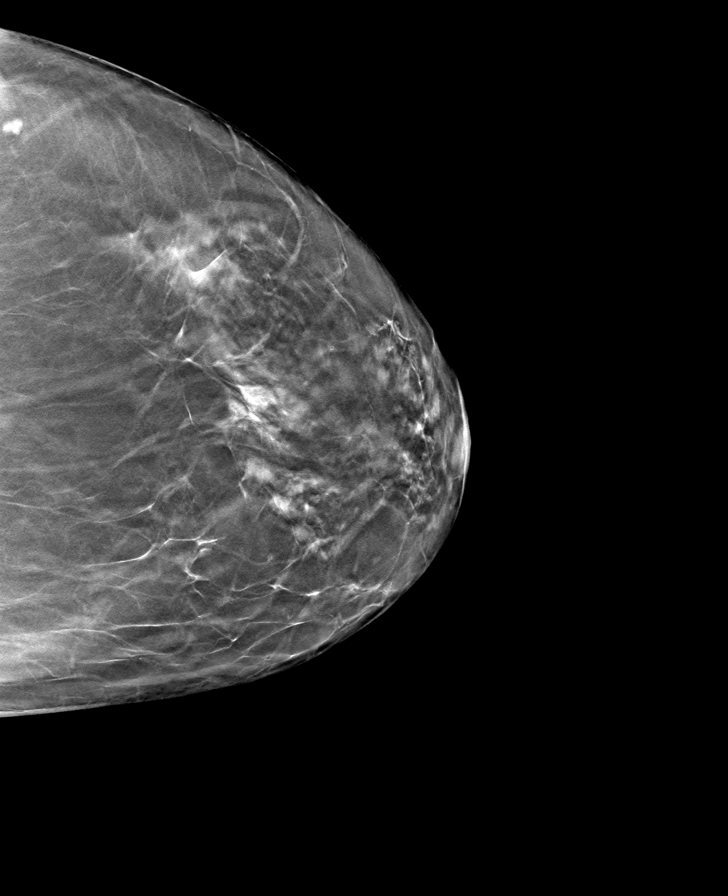

[8 of 24 positions shown; findings below may reference images not displayed]

ACR Breast Density Category b: There are scattered areas of
fibroglandular density.
FINDINGS: There are no findings suspicious for malignancy.
IMPRESSION: No mammographic evidence of malignancy. A result letter of this
screening mammogram will be mailed directly to the patient.

RECOMMENDATION:
Screening mammogram in one year. (Code:51-O-LD2)

BI-RADS CATEGORY  1: Negative.

## 2022-02-15 ENCOUNTER — Ambulatory Visit (INDEPENDENT_AMBULATORY_CARE_PROVIDER_SITE_OTHER): Payer: Medicare Other

## 2022-02-15 DIAGNOSIS — E538 Deficiency of other specified B group vitamins: Secondary | ICD-10-CM | POA: Diagnosis not present

## 2022-02-15 MED ORDER — CYANOCOBALAMIN 1000 MCG/ML IJ SOLN
1000.0000 ug | Freq: Once | INTRAMUSCULAR | Status: AC
  Administered 2022-02-15: 1000 ug via INTRAMUSCULAR

## 2022-03-15 ENCOUNTER — Ambulatory Visit (INDEPENDENT_AMBULATORY_CARE_PROVIDER_SITE_OTHER): Payer: Medicare Other

## 2022-03-15 DIAGNOSIS — E538 Deficiency of other specified B group vitamins: Secondary | ICD-10-CM

## 2022-03-15 MED ORDER — CYANOCOBALAMIN 1000 MCG/ML IJ SOLN
1000.0000 ug | Freq: Once | INTRAMUSCULAR | Status: AC
  Administered 2022-03-15: 1000 ug via INTRAMUSCULAR

## 2022-03-25 ENCOUNTER — Other Ambulatory Visit: Payer: Self-pay | Admitting: Family Medicine

## 2022-03-25 DIAGNOSIS — I10 Essential (primary) hypertension: Secondary | ICD-10-CM

## 2022-03-29 ENCOUNTER — Other Ambulatory Visit: Payer: Self-pay | Admitting: Family Medicine

## 2022-03-29 DIAGNOSIS — Z1231 Encounter for screening mammogram for malignant neoplasm of breast: Secondary | ICD-10-CM

## 2022-04-12 ENCOUNTER — Ambulatory Visit (INDEPENDENT_AMBULATORY_CARE_PROVIDER_SITE_OTHER): Payer: Medicare Other

## 2022-04-12 DIAGNOSIS — E538 Deficiency of other specified B group vitamins: Secondary | ICD-10-CM

## 2022-04-12 MED ORDER — CYANOCOBALAMIN 1000 MCG/ML IJ SOLN
1000.0000 ug | Freq: Once | INTRAMUSCULAR | Status: AC
  Administered 2022-04-12: 1000 ug via INTRAMUSCULAR

## 2022-05-10 ENCOUNTER — Ambulatory Visit (INDEPENDENT_AMBULATORY_CARE_PROVIDER_SITE_OTHER): Payer: Medicare Other

## 2022-05-10 DIAGNOSIS — E538 Deficiency of other specified B group vitamins: Secondary | ICD-10-CM | POA: Diagnosis not present

## 2022-05-10 MED ORDER — CYANOCOBALAMIN 1000 MCG/ML IJ SOLN
1000.0000 ug | Freq: Once | INTRAMUSCULAR | Status: AC
  Administered 2022-05-10: 1000 ug via INTRAMUSCULAR

## 2022-05-15 ENCOUNTER — Ambulatory Visit
Admission: RE | Admit: 2022-05-15 | Discharge: 2022-05-15 | Disposition: A | Discharge status: Home / Self Care | Payer: Medicare Other | Source: Ambulatory Visit | Attending: Family Medicine | Admitting: Family Medicine

## 2022-05-15 DIAGNOSIS — Z1231 Encounter for screening mammogram for malignant neoplasm of breast: Secondary | ICD-10-CM | POA: Insufficient documentation

## 2022-05-15 NOTE — Progress Notes (Signed)
Hi Michaela Weber  Normal mammogram; repeat in 1 year.  Please let us know if you have any questions.  Thank you,  Tally Joe, FNP

## 2022-06-07 ENCOUNTER — Ambulatory Visit (INDEPENDENT_AMBULATORY_CARE_PROVIDER_SITE_OTHER): Payer: Medicare Other

## 2022-06-07 DIAGNOSIS — E538 Deficiency of other specified B group vitamins: Secondary | ICD-10-CM

## 2022-06-07 MED ORDER — CYANOCOBALAMIN 1000 MCG/ML IJ SOLN
1000.0000 ug | Freq: Once | INTRAMUSCULAR | Status: AC
  Administered 2022-06-07: 1000 ug via INTRAMUSCULAR

## 2022-07-05 ENCOUNTER — Ambulatory Visit: Payer: Medicare Other

## 2022-07-05 ENCOUNTER — Ambulatory Visit (INDEPENDENT_AMBULATORY_CARE_PROVIDER_SITE_OTHER): Payer: Medicare Other | Admitting: *Deleted

## 2022-07-05 DIAGNOSIS — E538 Deficiency of other specified B group vitamins: Secondary | ICD-10-CM

## 2022-07-05 MED ORDER — CYANOCOBALAMIN 1000 MCG/ML IJ SOLN
1000.0000 ug | Freq: Once | INTRAMUSCULAR | Status: AC
  Administered 2022-07-05: 1000 ug via INTRAMUSCULAR

## 2022-07-28 NOTE — Progress Notes (Signed)
I,Sulibeya S Dimas,acting as a Neurosurgeon for Michaela Latch, MD.,have documented all relevant documentation on the behalf of Michaela Latch, MD,as directed by  Michaela Latch, MD while in the presence of Michaela Latch, MD.    Annual Wellness Visit     Patient: Michaela Weber, Female    DOB: [birth year], 76 y.o.   MRN: [id number] Visit Date: 07/31/2022  Today's Provider: Shirlee Latch, MD   Chief Complaint  Patient presents with   Medicare Wellness   Subjective    Michaela Weber is a 76 y.o. female who presents today for her Annual Wellness Visit. She reports consuming a general diet. Home exercise routine includes jogging. She generally feels well. She reports sleeping well. She does not have additional problems to discuss today.   HPI Patient advised of shingles vaccine. She reports she "does not care to do that." Patient declined flu vaccine.  Medications: Outpatient Medications Prior to Visit  Medication Sig   aspirin 81 MG EC tablet Take 81 mg by mouth daily.   levothyroxine (SYNTHROID) 112 MCG tablet TAKE 1 TABLET(112 MCG) BY MOUTH DAILY   lisinopril-hydrochlorothiazide (ZESTORETIC) 10-12.5 MG tablet TAKE 1 TABLET BY MOUTH DAILY   VITAMIN D, CHOLECALCIFEROL, PO Take 2,000 Units by mouth daily.   [DISCONTINUED] Na Sulfate-K Sulfate-Mg Sulf (SUPREP BOWEL PREP KIT) 17.5-3.13-1.6 GM/177ML SOLN Take 1 kit by mouth as directed.   No facility-administered medications prior to visit.    Allergies  Allergen Reactions   Codeine Nausea And Vomiting    Patient Care Team: Erasmo Downer, MD as PCP - [facility], [facility]  Review of Systems  All other systems reviewed and are negative.   Last CBC Lab Results  Component Value Date   WBC 8.7 07/26/2021   HGB 13.7 07/26/2021   HCT 42.3 07/26/2021   MCV 79 07/26/2021   MCH 25.5 (L) 07/26/2021   RDW 13.8 07/26/2021   PLT 366 07/26/2021   Last  metabolic panel Lab Results  Component Value Date   GLUCOSE 112 (H) 02/06/2022   NA 140 02/06/2022   K 4.3 02/06/2022   CL 99 02/06/2022   CO2 25 02/06/2022   BUN 17 02/06/2022   CREATININE 0.66 02/06/2022   EGFR 91 02/06/2022   CALCIUM 10.1 02/06/2022   PROT 7.2 02/06/2022   ALBUMIN 4.7 02/06/2022   LABGLOB 2.5 02/06/2022   AGRATIO 1.9 02/06/2022   BILITOT 0.3 02/06/2022   ALKPHOS 118 02/06/2022   AST 16 02/06/2022   ALT 13 02/06/2022   Last lipids Lab Results  Component Value Date   CHOL 222 (H) 02/06/2022   HDL 61 02/06/2022   LDLCALC 147 (H) 02/06/2022   TRIG 77 02/06/2022   CHOLHDL 3.6 02/06/2022   Last hemoglobin A1c Lab Results  Component Value Date   HGBA1C 6.4 (H) 02/06/2022   Last thyroid functions Lab Results  Component Value Date   TSH 0.526 02/06/2022   T4TOTAL 8.7 01/18/2017   Last vitamin D Lab Results  Component Value Date   VD25OH 46.3 07/26/2021   Last vitamin B12 and Folate Lab Results  Component Value Date   VITAMINB12 474 02/06/2022        Objective    Vitals: BP 123/61 (BP Location: Left Arm, Patient Position: Sitting, Cuff Size: Large)   Pulse 92   Temp 98.7 F (37.1 C) (Oral)   Ht 5\' 2"  (1.575 m)   Wt 141 lb 11.2 oz (64.3 kg)   BMI  25.92 kg/m  BP Readings from Last 3 Encounters:  07/31/22 123/61  02/06/22 121/74  10/07/21 127/88   Wt Readings from Last 3 Encounters:  07/31/22 141 lb 11.2 oz (64.3 kg)  02/06/22 137 lb (62.1 kg)  10/07/21 136 lb (61.7 kg)      Physical Exam Vitals reviewed.  Constitutional:      General: She is not in acute distress.    Appearance: Normal appearance. She is well-developed. She is not diaphoretic.  HENT:     Head: Normocephalic and atraumatic.     Right Ear: Tympanic membrane, ear canal and external ear normal.     Left Ear: Tympanic membrane, ear canal and external ear normal.     Nose: Nose normal.     Mouth/Throat:     Mouth: Mucous membranes are moist.     Pharynx:  Oropharynx is clear. No oropharyngeal exudate.  Eyes:     General: No scleral icterus.    Conjunctiva/sclera: Conjunctivae normal.     Pupils: Pupils are equal, round, and reactive to light.  Neck:     Thyroid: No thyromegaly.  Cardiovascular:     Rate and Rhythm: Normal rate and regular rhythm.     Pulses: Normal pulses.     Heart sounds: Normal heart sounds. No murmur heard. Pulmonary:     Effort: Pulmonary effort is normal. No respiratory distress.     Breath sounds: Normal breath sounds. No wheezing or rales.  Abdominal:     General: There is no distension.     Palpations: Abdomen is soft.     Tenderness: There is no abdominal tenderness.  Musculoskeletal:        General: No deformity.     Cervical back: Neck supple.     Right lower leg: No edema.     Left lower leg: No edema.  Lymphadenopathy:     Cervical: No cervical adenopathy.  Skin:    General: Skin is warm and dry.     Findings: No rash.  Neurological:     Mental Status: She is alert and oriented to person, place, and time. Mental status is at baseline.     Gait: Gait normal.  Psychiatric:        Mood and Affect: Mood normal.        Behavior: Behavior normal.        Thought Content: Thought content normal.      Most recent functional status assessment:    07/31/2022    9:20 AM  In your present state of health, do you have any difficulty performing the following activities:  Hearing? 0  Vision? 0  Difficulty concentrating or making decisions? 0  Walking or climbing stairs? 0  Dressing or bathing? 0  Doing errands, shopping? 0   Most recent fall risk assessment:    07/31/2022    9:19 AM  Fall Risk   Falls in the past year? 0  Number falls in past yr: 0  Injury with Fall? 0  Risk for fall due to : No Fall Risks  Follow up Falls evaluation completed    Most recent depression screenings:    07/31/2022    9:19 AM 07/26/2021    9:18 AM  PHQ 2/9 Scores  PHQ - 2 Score 0 0  PHQ- 9 Score 0 0   Most  recent cognitive screening:    07/26/2021    9:20 AM  6CIT Screen  What Year? 0 points  What month? 0 points  What  time? 0 points  Count back from 20 0 points  Months in reverse 0 points  Repeat phrase 0 points  Total Score 0 points   Most recent Audit-C alcohol use screening    07/31/2022    9:20 AM  Alcohol Use Disorder Test (AUDIT)  1. How often do you have a drink containing alcohol? 0  2. How many drinks containing alcohol do you have on a typical day when you are drinking? 0  3. How often do you have six or more drinks on one occasion? 0  AUDIT-C Score 0   A score of 3 or more in women, and 4 or more in men indicates increased risk for alcohol abuse, EXCEPT if all of the points are from question 1   No results found for any visits on 07/31/22.  Assessment & Plan     Annual wellness visit done today including the all of the following: Reviewed patient's Family Medical History Reviewed and updated list of patient's medical providers Assessment of cognitive impairment was done Assessed patient's functional ability Established a written schedule for health screening services Health Risk Assessent Completed and Reviewed  Exercise Activities and Dietary recommendations  Goals      DIET - INCREASE WATER INTAKE     Recommend increasing water intake to 4-6 glasses a day.      Prevent falls     Recommend to remove any items from the home that may cause slips or trips.        Immunization History  Administered Date(s) Administered   Fluad Quad(high Dose 65+) 06/16/2019   Influenza, High Dose Seasonal PF 07/18/2016, 07/19/2018   Influenza-Unspecified 06/22/2014   Pneumococcal Conjugate-13 06/22/2014   Pneumococcal Polysaccharide-23 11/24/2011    Health Maintenance  Topic Date Due   COVID-19 Vaccine (1) Never done   Zoster Vaccines- Shingrix (1 of 2) Never done   DEXA SCAN  02/23/2020   INFLUENZA VACCINE  12/24/2022 (Originally 04/25/2022)   TETANUS/TDAP   09/25/2026 (Originally 03/20/1965)   Medicare Annual Wellness (AWV)  08/01/2023   Pneumonia Vaccine 24+ Years old  Completed   Hepatitis C Screening  Completed   HPV VACCINES  Aged Out   COLONOSCOPY (Pts 45-40yrs Insurance coverage will need to be confirmed)  Discontinued     Discussed health benefits of physical activity, and encouraged her to engage in regular exercise appropriate for her age and condition.    Problem List Items Addressed This Visit       Cardiovascular and Mediastinum   Essential hypertension    Well controlled Continue current medications Recheck metabolic panel F/u in 6 months       Relevant Orders   Comprehensive metabolic panel     Endocrine   Adult hypothyroidism    Previously well controlled Continue Synthroid at current dose  Recheck TSH and adjust Synthroid as indicated        Relevant Orders   TSH     Musculoskeletal and Integument   Osteopenia    Discussed repeating DEXA Patient is hesitant as she is unsure whether she would seek treatment (fosamax did not agree with her in the past) Will consider        Other   Vitamin D deficiency    Continue supplement Recheck level       Relevant Orders   VITAMIN D 25 Hydroxy (Vit-D Deficiency, Fractures)   HLD (hyperlipidemia)    Reviewed last lipid panel Not currently on a statin Recheck FLP and CMP Discussed  diet and exercise       Relevant Orders   Lipid Panel With LDL/HDL Ratio   Megaloblastic anemia due to B12 deficiency    Continue to monitor B12 and CBC levels      Relevant Medications   cyanocobalamin (VITAMIN B12) injection 1,000 mcg (Start on 07/31/2022  9:45 AM)   Other Relevant Orders   CBC   Pre-diabetes    Recommend low carb diet Recheck A1c       Relevant Orders   Hemoglobin A1c   B12 deficiency   Relevant Medications   cyanocobalamin (VITAMIN B12) injection 1,000 mcg (Start on 07/31/2022  9:45 AM)   Other Relevant Orders   Vitamin B12   Other Visit  Diagnoses     Encounter for Medicare annual wellness exam    -  Primary   Relevant Orders   Comprehensive metabolic panel   Lipid Panel With LDL/HDL Ratio   Hemoglobin A1c   TSH   Vitamin B12   VITAMIN D 25 Hydroxy (Vit-D Deficiency, Fractures)   Encounter for annual physical exam            Return in about 6 months (around 01/29/2023) for chronic disease f/u.     I, Michaela Latch, MD, have reviewed all documentation for this visit. The documentation on 07/31/22 for the exam, diagnosis, procedures, and orders are all accurate and complete.   Lachrista Heslin, Marzella Schlein, MD, MPH [facility]

## 2022-07-31 ENCOUNTER — Ambulatory Visit (INDEPENDENT_AMBULATORY_CARE_PROVIDER_SITE_OTHER): Payer: Medicare Other | Admitting: Family Medicine

## 2022-07-31 ENCOUNTER — Encounter: Payer: Self-pay | Admitting: Family Medicine

## 2022-07-31 VITALS — BP 123/61 | HR 92 | Temp 98.7°F | Ht 62.0 in | Wt 141.7 lb

## 2022-07-31 DIAGNOSIS — D531 Other megaloblastic anemias, not elsewhere classified: Secondary | ICD-10-CM | POA: Diagnosis not present

## 2022-07-31 DIAGNOSIS — E039 Hypothyroidism, unspecified: Secondary | ICD-10-CM | POA: Diagnosis not present

## 2022-07-31 DIAGNOSIS — R7303 Prediabetes: Secondary | ICD-10-CM | POA: Diagnosis not present

## 2022-07-31 DIAGNOSIS — E559 Vitamin D deficiency, unspecified: Secondary | ICD-10-CM

## 2022-07-31 DIAGNOSIS — M8580 Other specified disorders of bone density and structure, unspecified site: Secondary | ICD-10-CM | POA: Diagnosis not present

## 2022-07-31 DIAGNOSIS — E538 Deficiency of other specified B group vitamins: Secondary | ICD-10-CM | POA: Diagnosis not present

## 2022-07-31 DIAGNOSIS — Z0000 Encounter for general adult medical examination without abnormal findings: Secondary | ICD-10-CM | POA: Diagnosis not present

## 2022-07-31 DIAGNOSIS — I10 Essential (primary) hypertension: Secondary | ICD-10-CM | POA: Diagnosis not present

## 2022-07-31 DIAGNOSIS — E7800 Pure hypercholesterolemia, unspecified: Secondary | ICD-10-CM

## 2022-07-31 MED ORDER — CYANOCOBALAMIN 1000 MCG/ML IJ SOLN
1000.0000 ug | Freq: Once | INTRAMUSCULAR | Status: AC
  Administered 2022-07-31: 1000 ug via INTRAMUSCULAR

## 2022-07-31 NOTE — Assessment & Plan Note (Signed)
Discussed repeating DEXA Patient is hesitant as she is unsure whether she would seek treatment (fosamax did not agree with her in the past) Will consider

## 2022-07-31 NOTE — Assessment & Plan Note (Signed)
Reviewed last lipid panel Not currently on a statin Recheck FLP and CMP Discussed diet and exercise  

## 2022-07-31 NOTE — Assessment & Plan Note (Signed)
Well controlled Continue current medications Recheck metabolic panel F/u in 6 months  

## 2022-07-31 NOTE — Assessment & Plan Note (Signed)
Recommend low carb diet °Recheck A1c  °

## 2022-07-31 NOTE — Assessment & Plan Note (Signed)
Previously well controlled Continue Synthroid at current dose  Recheck TSH and adjust Synthroid as indicated   

## 2022-07-31 NOTE — Assessment & Plan Note (Signed)
Continue to monitor B12 and CBC levels

## 2022-07-31 NOTE — Assessment & Plan Note (Signed)
Continue supplement Recheck level 

## 2022-08-01 LAB — COMPREHENSIVE METABOLIC PANEL
ALT: 20 IU/L (ref 0–32)
AST: 23 IU/L (ref 0–40)
Albumin/Globulin Ratio: 1.9 (ref 1.2–2.2)
Albumin: 4.6 g/dL (ref 3.8–4.8)
Alkaline Phosphatase: 131 IU/L — ABNORMAL HIGH (ref 44–121)
BUN/Creatinine Ratio: 19 (ref 12–28)
BUN: 14 mg/dL (ref 8–27)
Bilirubin Total: 0.4 mg/dL (ref 0.0–1.2)
CO2: 25 mmol/L (ref 20–29)
Calcium: 10.1 mg/dL (ref 8.7–10.3)
Chloride: 98 mmol/L (ref 96–106)
Creatinine, Ser: 0.72 mg/dL (ref 0.57–1.00)
Globulin, Total: 2.4 g/dL (ref 1.5–4.5)
Glucose: 119 mg/dL — ABNORMAL HIGH (ref 70–99)
Potassium: 4.2 mmol/L (ref 3.5–5.2)
Sodium: 138 mmol/L (ref 134–144)
Total Protein: 7 g/dL (ref 6.0–8.5)
eGFR: 87 mL/min/{1.73_m2} (ref >59)

## 2022-08-01 LAB — CBC
Hematocrit: 41.7 % (ref 34.0–46.6)
Hemoglobin: 13.8 g/dL (ref 11.1–15.9)
MCH: 25.6 pg — ABNORMAL LOW (ref 26.6–33.0)
MCHC: 33.1 g/dL (ref 31.5–35.7)
MCV: 77 fL — ABNORMAL LOW (ref 79–97)
Platelets: 400 10*3/uL (ref 150–450)
RBC: 5.4 x10E6/uL — ABNORMAL HIGH (ref 3.77–5.28)
RDW: 13.3 % (ref 11.7–15.4)
WBC: 8 10*3/uL (ref 3.4–10.8)

## 2022-08-01 LAB — LIPID PANEL WITH LDL/HDL RATIO
Cholesterol, Total: 217 mg/dL — ABNORMAL HIGH (ref 100–199)
HDL: 50 mg/dL (ref >39)
LDL Chol Calc (NIH): 140 mg/dL — ABNORMAL HIGH (ref 0–99)
LDL/HDL Ratio: 2.8 ratio (ref 0.0–3.2)
Triglycerides: 152 mg/dL — ABNORMAL HIGH (ref 0–149)
VLDL Cholesterol Cal: 27 mg/dL (ref 5–40)

## 2022-08-01 LAB — VITAMIN D 25 HYDROXY (VIT D DEFICIENCY, FRACTURES): Vit D, 25-Hydroxy: 37.4 ng/mL (ref 30.0–100.0)

## 2022-08-01 LAB — TSH: TSH: 1.2 u[IU]/mL (ref 0.450–4.500)

## 2022-08-01 LAB — VITAMIN B12: Vitamin B-12: 562 pg/mL (ref 232–1245)

## 2022-08-01 LAB — HEMOGLOBIN A1C
Est. average glucose Bld gHb Est-mCnc: 140 mg/dL
Hgb A1c MFr Bld: 6.5 % — ABNORMAL HIGH (ref 4.8–5.6)

## 2022-08-30 ENCOUNTER — Ambulatory Visit (INDEPENDENT_AMBULATORY_CARE_PROVIDER_SITE_OTHER): Payer: Medicare Other

## 2022-08-30 DIAGNOSIS — E538 Deficiency of other specified B group vitamins: Secondary | ICD-10-CM | POA: Diagnosis not present

## 2022-08-30 MED ORDER — CYANOCOBALAMIN 1000 MCG/ML IJ SOLN
1000.0000 ug | Freq: Once | INTRAMUSCULAR | Status: AC
  Administered 2022-08-30: 1000 ug via INTRAMUSCULAR

## 2022-09-24 ENCOUNTER — Other Ambulatory Visit: Payer: Self-pay | Admitting: Family Medicine

## 2022-09-24 DIAGNOSIS — I10 Essential (primary) hypertension: Secondary | ICD-10-CM

## 2022-09-26 NOTE — Telephone Encounter (Signed)
Requested Prescriptions  Pending Prescriptions Disp Refills   lisinopril-hydrochlorothiazide (ZESTORETIC) 10-12.5 MG tablet [Pharmacy Med Name: LISINOPRIL-HCTZ 10/12.5MG TABLETS] 90 tablet 0    Sig: TAKE 1 TABLET BY MOUTH DAILY     Cardiovascular:  ACEI + Diuretic Combos Passed - 09/24/2022  7:07 AM      Passed - Na in normal range and within 180 days    Sodium  Date Value Ref Range Status  07/31/2022 138 134 - 144 mmol/L Final         Passed - K in normal range and within 180 days    Potassium  Date Value Ref Range Status  07/31/2022 4.2 3.5 - 5.2 mmol/L Final         Passed - Cr in normal range and within 180 days    Creat  Date Value Ref Range Status  07/19/2017 0.63 0.60 - 0.93 mg/dL Final    Comment:    For patients >26 years of age, the reference limit for Creatinine is approximately 13% higher for people identified as African-American. .    Creatinine, Ser  Date Value Ref Range Status  07/31/2022 0.72 0.57 - 1.00 mg/dL Final         Passed - eGFR is 30 or above and within 180 days    GFR, Est African American  Date Value Ref Range Status  07/19/2017 105 > OR = 60 mL/min/1.22m Final   GFR calc Af Amer  Date Value Ref Range Status  07/20/2020 102 >59 mL/min/1.73 Final    Comment:    **In accordance with recommendations from the NKF-ASN Task force,**   [facility] is in the process of updating its eGFR calculation to the   2021 CKD-EPI creatinine equation that estimates kidney function   without a race variable.    GFR, Est Non African American  Date Value Ref Range Status  07/19/2017 90 > OR = 60 mL/min/1.736mFinal   GFR calc non Af Amer  Date Value Ref Range Status  07/20/2020 89 >59 mL/min/1.73 Final   eGFR  Date Value Ref Range Status  07/31/2022 87 >59 mL/min/1.73 Final         Passed - Patient is not pregnant      Passed - Last BP in normal range    BP Readings from Last 1 Encounters:  07/31/22 123/61         Passed - Valid encounter  within last 6 months    Recent Outpatient Visits           1 month ago Encounter for [employer]ual wellness exam   [facility]   7 months ago Essential hypertension   [place]   1 year ago Encounter for Medicare annual wellness exam   [facility]   1 year ago Essential hypertension   [place]   2 years ago Encounter for annual physical exam   [facility]       Future Appointments             In 1 month Bacigalupo, AnDionne BucyMD [facility] In 4 months Bacigalupo, AnDionne BucyMD [facility]

## 2022-09-27 ENCOUNTER — Ambulatory Visit (INDEPENDENT_AMBULATORY_CARE_PROVIDER_SITE_OTHER): Payer: Medicare Other

## 2022-09-27 DIAGNOSIS — E538 Deficiency of other specified B group vitamins: Secondary | ICD-10-CM

## 2022-09-27 MED ORDER — CYANOCOBALAMIN 1000 MCG/ML IJ SOLN
1000.0000 ug | Freq: Once | INTRAMUSCULAR | Status: AC
  Administered 2022-09-27: 1000 ug via INTRAMUSCULAR

## 2022-09-28 ENCOUNTER — Other Ambulatory Visit: Payer: Self-pay | Admitting: Family Medicine

## 2022-09-28 DIAGNOSIS — E039 Hypothyroidism, unspecified: Secondary | ICD-10-CM

## 2022-10-25 ENCOUNTER — Ambulatory Visit (INDEPENDENT_AMBULATORY_CARE_PROVIDER_SITE_OTHER): Payer: Medicare Other | Admitting: Family Medicine

## 2022-10-25 DIAGNOSIS — E538 Deficiency of other specified B group vitamins: Secondary | ICD-10-CM

## 2022-10-25 MED ORDER — CYANOCOBALAMIN 1000 MCG/ML IJ SOLN
1000.0000 ug | Freq: Once | INTRAMUSCULAR | Status: AC
  Administered 2022-10-25: 1000 ug via INTRAMUSCULAR

## 2022-11-02 NOTE — Progress Notes (Signed)
I,Sulibeya S Dimas,acting as a Education administrator for Lavon Paganini, MD.,have documented all relevant documentation on the behalf of Lavon Paganini, MD,as directed by  Lavon Paganini, MD while in the presence of Lavon Paganini, MD.     Established patient visit   Patient: Michaela Weber   DOB: 01/01/1946   77 y.o. Female  MRN: [id number] Visit Date: 11/03/2022  Today's healthcare provider: Lavon Paganini, MD   Chief Complaint  Patient presents with   Hyperlipidemia   Diabetes   Subjective    HPI  Lipid/Cholesterol, Follow-up  Last lipid panel Other pertinent labs  Lab Results  Component Value Date   CHOL 217 (H) 07/31/2022   HDL 50 07/31/2022   LDLCALC 140 (H) 07/31/2022   TRIG 152 (H) 07/31/2022   CHOLHDL 3.6 02/06/2022   Lab Results  Component Value Date   ALT 20 07/31/2022   AST 23 07/31/2022   PLT 400 07/31/2022   TSH 1.200 07/31/2022     She was last seen for this 3 months ago.  Management since that visit includes recommended to start Crestor 5 mg daily. Patient declined to start medication and agreed to working on healthy lifestyle changes.   The 10-year ASCVD risk score (Arnett DK, et al., 2019) is: 42%  --------------------------------------------------------------------------------------------------- Diabetes Mellitus Type II, Follow-up  Lab Results  Component Value Date   HGBA1C 6.7 (A) 11/03/2022   HGBA1C 6.5 (H) 07/31/2022   HGBA1C 6.4 (H) 02/06/2022   Wt Readings from Last 3 Encounters:  11/03/22 140 lb (63.5 kg)  07/31/22 141 lb 11.2 oz (64.3 kg)  02/06/22 137 lb (62.1 kg)   Last seen for diabetes 3 months ago.  Management since then includes work on diet and exercise.      No oral medication.   Home blood sugar records:  not being checked  Episodes of hypoglycemia? No    Current insulin regiment: none Most Recent Eye Exam:    Pertinent Labs: Lab Results  Component Value Date   CHOL 217 (H) 07/31/2022   HDL 50  07/31/2022   LDLCALC 140 (H) 07/31/2022   TRIG 152 (H) 07/31/2022   CHOLHDL 3.6 02/06/2022   Lab Results  Component Value Date   NA 138 07/31/2022   K 4.2 07/31/2022   CREATININE 0.72 07/31/2022   EGFR 87 07/31/2022     ---------------------------------------------------------------------------------------------------   Medications: Outpatient Medications Prior to Visit  Medication Sig   aspirin 81 MG EC tablet Take 81 mg by mouth daily.   levothyroxine (SYNTHROID) 112 MCG tablet TAKE 1 TABLET(112 MCG) BY MOUTH DAILY   lisinopril-hydrochlorothiazide (ZESTORETIC) 10-12.5 MG tablet TAKE 1 TABLET BY MOUTH DAILY   VITAMIN D, CHOLECALCIFEROL, PO Take 2,000 Units by mouth daily.   No facility-administered medications prior to visit.    Review of Systems per HPI     Objective    BP 132/77 (BP Location: Left Arm, Patient Position: Sitting, Cuff Size: Large)   Pulse 86   Temp 98.1 F (36.7 C) (Temporal)   Resp 16   Ht 5' 2"$  (1.575 m)   Wt 140 lb (63.5 kg)   SpO2 99%   BMI 25.61 kg/m  BP Readings from Last 3 Encounters:  11/03/22 132/77  07/31/22 123/61  02/06/22 121/74   Wt Readings from Last 3 Encounters:  11/03/22 140 lb (63.5 kg)  07/31/22 141 lb 11.2 oz (64.3 kg)  02/06/22 137 lb (62.1 kg)      Physical Exam Vitals reviewed.  Constitutional:  General: She is not in acute distress.    Appearance: Normal appearance. She is well-developed. She is not diaphoretic.  HENT:     Head: Normocephalic and atraumatic.  Eyes:     General: No scleral icterus.    Conjunctiva/sclera: Conjunctivae normal.  Neck:     Thyroid: No thyromegaly.  Cardiovascular:     Rate and Rhythm: Normal rate and regular rhythm.     Pulses: Normal pulses.     Heart sounds: Normal heart sounds. No murmur heard. Pulmonary:     Effort: Pulmonary effort is normal. No respiratory distress.     Breath sounds: Normal breath sounds. No wheezing, rhonchi or rales.  Musculoskeletal:      Cervical back: Neck supple.     Right lower leg: No edema.     Left lower leg: No edema.  Lymphadenopathy:     Cervical: No cervical adenopathy.  Skin:    General: Skin is warm and dry.     Findings: No rash.  Neurological:     Mental Status: She is alert and oriented to person, place, and time. Mental status is at baseline.  Psychiatric:        Mood and Affect: Mood normal.        Behavior: Behavior normal.     Diabetic Foot Exam - Simple   Simple Foot Form Diabetic Foot exam was performed with the following findings: Yes 11/03/2022  8:38 AM  Visual Inspection No deformities, no ulcerations, no other skin breakdown bilaterally: Yes Sensation Testing Intact to touch and monofilament testing bilaterally: Yes Pulse Check Posterior Tibialis and Dorsalis pulse intact bilaterally: Yes Comments      Results for orders placed or performed in visit on 11/03/22  POCT glycosylated hemoglobin (Hb A1C)  Result Value Ref Range   Hemoglobin A1C 6.7 (A) 4.0 - 5.6 %   Est. average glucose Bld gHb Est-mCnc 146     Assessment & Plan     Problem List Items Addressed This Visit       Cardiovascular and Mediastinum   Hypertension associated with diabetes ([place])    Well controlled on manual recheck Continue current medications Reviewed metabolic panel F/u in 3 months       Relevant Medications   rosuvastatin (CRESTOR) 5 MG tablet     Endocrine   Hyperlipidemia associated with type 2 diabetes mellitus ([place]) - Primary    Reviewed last lipid panel Not currently on a statin In the setting of new diabetes diagnosis, discussed importance of statin Will try Crestor 26m daily Repeat FLP and CMP at next visit in 37miscussed diet and exercise       Relevant Medications   rosuvastatin (CRESTOR) 5 MG tablet   T2DM (type 2 diabetes mellitus) ([place]   New diagnosis Well controlled with A1c <7 No need for medications at this time UTD on vaccines,  Will schedule eye exam, foot exam  completed today UACR today On ACEi/ARB Starting statin as above Discussed diet and exercise F/u in 3 months       Relevant Medications   rosuvastatin (CRESTOR) 5 MG tablet   Other Relevant Orders   POCT glycosylated hemoglobin (Hb A1C) (Completed)   Urine Microalbumin w/creat. ratio   Other Visit Diagnoses     Essential hypertension       Relevant Medications   rosuvastatin (CRESTOR) 5 MG tablet        Return in about 3 months (around 02/01/2023) for chronic disease f/u, as scheduled.  I, Lavon Paganini, MD, have reviewed all documentation for this visit. The documentation on 11/03/22 for the exam, diagnosis, procedures, and orders are all accurate and complete.   Zebadiah Willert, Dionne Bucy, MD, MPH [facility]

## 2022-11-03 ENCOUNTER — Ambulatory Visit (INDEPENDENT_AMBULATORY_CARE_PROVIDER_SITE_OTHER): Payer: Medicare Other | Admitting: Family Medicine

## 2022-11-03 ENCOUNTER — Encounter: Payer: Self-pay | Admitting: Family Medicine

## 2022-11-03 VITALS — BP 132/77 | HR 86 | Temp 98.1°F | Resp 16 | Ht 62.0 in | Wt 140.0 lb

## 2022-11-03 DIAGNOSIS — E7800 Pure hypercholesterolemia, unspecified: Secondary | ICD-10-CM

## 2022-11-03 DIAGNOSIS — I10 Essential (primary) hypertension: Secondary | ICD-10-CM | POA: Diagnosis not present

## 2022-11-03 DIAGNOSIS — E785 Hyperlipidemia, unspecified: Secondary | ICD-10-CM | POA: Diagnosis not present

## 2022-11-03 DIAGNOSIS — E1169 Type 2 diabetes mellitus with other specified complication: Secondary | ICD-10-CM

## 2022-11-03 DIAGNOSIS — I152 Hypertension secondary to endocrine disorders: Secondary | ICD-10-CM | POA: Diagnosis not present

## 2022-11-03 DIAGNOSIS — E1159 Type 2 diabetes mellitus with other circulatory complications: Secondary | ICD-10-CM

## 2022-11-03 LAB — POCT GLYCOSYLATED HEMOGLOBIN (HGB A1C)
Est. average glucose Bld gHb Est-mCnc: 146
Hemoglobin A1C: 6.7 % — AB (ref 4.0–5.6)

## 2022-11-03 MED ORDER — ROSUVASTATIN CALCIUM 5 MG PO TABS: 5.0000 mg | ORAL_TABLET | Freq: Every day | ORAL | 3 refills | Status: DC

## 2022-11-03 NOTE — Assessment & Plan Note (Signed)
Well controlled on manual recheck Continue current medications Reviewed metabolic panel F/u in 3 months

## 2022-11-03 NOTE — Assessment & Plan Note (Signed)
Reviewed last lipid panel Not currently on a statin In the setting of new diabetes diagnosis, discussed importance of statin Will try Crestor 37m daily Repeat FLP and CMP at next visit in 370miscussed diet and exercise

## 2022-11-03 NOTE — Patient Instructions (Signed)
Schedule an eye exam

## 2022-11-03 NOTE — Assessment & Plan Note (Signed)
New diagnosis Well controlled with A1c <7 No need for medications at this time UTD on vaccines,  Will schedule eye exam, foot exam completed today UACR today On ACEi/ARB Starting statin as above Discussed diet and exercise F/u in 3 months

## 2022-11-05 LAB — MICROALBUMIN / CREATININE URINE RATIO
Creatinine, Urine: 20.3 mg/dL
Microalb/Creat Ratio: <15 mg/g creat (ref 0–29)
Microalbumin, Urine: <3 ug/mL

## 2022-11-20 ENCOUNTER — Telehealth: Payer: Self-pay

## 2022-11-20 NOTE — Telephone Encounter (Signed)
Copied from [place] [id number]. Topic: Appointment Scheduling - Scheduling Inquiry for Clinic >> Nov 20, 2022  8:56 AM Oley Balm A wrote: Reason for CRM: Pt is wanting to reschedule b12 appt. She will be out of town on  11/23/22  and is needing to cancel that appt and would like to take the March 4th appt at 8:40am.  Please advise

## 2022-11-23 ENCOUNTER — Ambulatory Visit: Payer: Medicare Other | Admitting: Family Medicine

## 2022-11-27 ENCOUNTER — Encounter: Payer: Self-pay | Admitting: Family Medicine

## 2022-11-27 ENCOUNTER — Encounter (INDEPENDENT_AMBULATORY_CARE_PROVIDER_SITE_OTHER): Payer: Medicare Other | Admitting: Family Medicine

## 2022-11-27 NOTE — Progress Notes (Signed)
Patient here for B12 injection only.  Patient left without being seen.

## 2022-12-05 ENCOUNTER — Telehealth: Payer: Self-pay

## 2022-12-05 NOTE — Telephone Encounter (Signed)
Copied from [place] [id number]. Topic: Complaint - Staff >> Dec 05, 2022 10:17 AM Tommye Standard G wrote: Patient stated her last B 12  visit she had to wait a long time, in which she ended up leaving before being seen.  This upcoming visit she would like to be notified if there is going to be a delay. B12 appt was r/s with Dr Brita Romp. Route to Engineer, building services.

## 2022-12-08 ENCOUNTER — Ambulatory Visit (INDEPENDENT_AMBULATORY_CARE_PROVIDER_SITE_OTHER): Payer: Medicare Other | Admitting: Family Medicine

## 2022-12-08 ENCOUNTER — Encounter: Payer: Self-pay | Admitting: Family Medicine

## 2022-12-08 DIAGNOSIS — E538 Deficiency of other specified B group vitamins: Secondary | ICD-10-CM

## 2022-12-08 MED ORDER — CYANOCOBALAMIN 1000 MCG/ML IJ SOLN
1000.0000 ug | Freq: Once | INTRAMUSCULAR | Status: AC
  Administered 2022-12-08: 1000 ug via INTRAMUSCULAR

## 2022-12-08 NOTE — Progress Notes (Signed)
Patient here for B12 injection only.  I did not examine the patient.  I did review his medical history, medications, and allergies.  CMA gave injection. Patient tolerated well.  Virginia Crews, MD, MPH [facility] 12/08/2022 9:15 AM

## 2022-12-23 ENCOUNTER — Other Ambulatory Visit: Payer: Self-pay | Admitting: Family Medicine

## 2022-12-23 DIAGNOSIS — I10 Essential (primary) hypertension: Secondary | ICD-10-CM

## 2022-12-25 NOTE — Telephone Encounter (Signed)
Requested Prescriptions  Pending Prescriptions Disp Refills   lisinopril-hydrochlorothiazide (ZESTORETIC) 10-12.5 MG tablet [Pharmacy Med Name: LISINOPRIL-HCTZ 10/12.5MG  TABLETS] 90 tablet 0    Sig: TAKE 1 TABLET BY MOUTH DAILY     Cardiovascular:  ACEI + Diuretic Combos Passed - 12/23/2022  6:23 AM      Passed - Na in normal range and within 180 days    Sodium  Date Value Ref Range Status  07/31/2022 138 134 - 144 mmol/L Final         Passed - K in normal range and within 180 days    Potassium  Date Value Ref Range Status  07/31/2022 4.2 3.5 - 5.2 mmol/L Final         Passed - Cr in normal range and within 180 days    Creat  Date Value Ref Range Status  07/19/2017 0.63 0.60 - 0.93 mg/dL Final    Comment:    For patients >40 years of age, the reference limit for Creatinine is approximately 13% higher for people identified as African-American. .    Creatinine, Ser  Date Value Ref Range Status  07/31/2022 0.72 0.57 - 1.00 mg/dL Final         Passed - eGFR is 30 or above and within 180 days    GFR, Est African American  Date Value Ref Range Status  07/19/2017 105 > OR = 60 mL/min/1.16m2 Final   GFR calc Af Amer  Date Value Ref Range Status  07/20/2020 102 >59 mL/min/1.73 Final    Comment:    **In accordance with recommendations from the NKF-ASN Task force,**   [facility] is in the process of updating its eGFR calculation to the   2021 CKD-EPI creatinine equation that estimates kidney function   without a race variable.    GFR, Est Non African American  Date Value Ref Range Status  07/19/2017 90 > OR = 60 mL/min/1.63m2 Final   GFR calc non Af Amer  Date Value Ref Range Status  07/20/2020 89 >59 mL/min/1.73 Final   eGFR  Date Value Ref Range Status  07/31/2022 87 >59 mL/min/1.73 Final         Passed - Patient is not pregnant      Passed - Last BP in normal range    BP Readings from Last 1 Encounters:  11/03/22 132/77         Passed - Valid encounter within  last 6 months    Recent Outpatient Visits           2 weeks ago B12 deficiency   [place], Dionne Bucy, MD   1 month ago Hyperlipidemia associated with type 2 diabetes mellitus [facility])   [place], Dionne Bucy, MD   4 months ago Encounter for [employer] annual wellness exam   [facility] [place], Dionne Bucy, MD   10 months ago Essential hypertension   [place], Dionne Bucy, MD   1 year ago Encounter for Medicare annual wellness exam   [place], Dionne Bucy, MD       Future Appointments             In 1 month Bacigalupo, Dionne Bucy, MD [facility]

## 2023-01-03 ENCOUNTER — Ambulatory Visit (INDEPENDENT_AMBULATORY_CARE_PROVIDER_SITE_OTHER): Payer: Medicare Other

## 2023-01-03 DIAGNOSIS — E538 Deficiency of other specified B group vitamins: Secondary | ICD-10-CM

## 2023-01-03 MED ORDER — CYANOCOBALAMIN 1000 MCG/ML IJ SOLN
1000.0000 ug | Freq: Once | INTRAMUSCULAR | Status: AC
  Administered 2023-01-03: 1000 ug via INTRAMUSCULAR

## 2023-01-30 ENCOUNTER — Ambulatory Visit: Payer: Medicare Other | Admitting: Family Medicine

## 2023-01-31 ENCOUNTER — Ambulatory Visit: Payer: Medicare Other

## 2023-01-31 NOTE — Progress Notes (Unsigned)
I,Ivi Griffith S Ezrie Bunyan,acting as a Neurosurgeon for Shirlee Latch, MD.,have documented all relevant documentation on the behalf of Shirlee Latch, MD,as directed by  Shirlee Latch, MD while in the presence of Shirlee Latch, MD.     Established patient visit   Patient: Michaela Weber   DOB: 1946-08-03   77 y.o. Female  MRN: [id number] Visit Date: 02/01/2023  Today's healthcare provider: Shirlee Latch, MD   No chief complaint on file.  Subjective    HPI  Diabetes Mellitus Type II, follow-up  Lab Results  Component Value Date   HGBA1C 6.7 (A) 11/03/2022   HGBA1C 6.5 (H) 07/31/2022   HGBA1C 6.4 (H) 02/06/2022   Last seen for diabetes 3 months ago.  Management since then includes no changes. No oral medication.   Home blood sugar records: {diabetes glucometry results:16657}  Episodes of hypoglycemia? {Yes/No:20286} {enter details if yes:1}   Current insulin regiment: none Most Recent Eye Exam: ***  --------------------------------------------------------------------------------------------------- Hypertension, follow-up  BP Readings from Last 3 Encounters:  11/03/22 132/77  07/31/22 123/61  02/06/22 121/74   Wt Readings from Last 3 Encounters:  11/03/22 140 lb (63.5 kg)  07/31/22 141 lb 11.2 oz (64.3 kg)  02/06/22 137 lb (62.1 kg)     She was last seen for hypertension 3 months ago.  BP at that visit was 132/77. Management since that visit includes no changes. She reports {excellent/good/fair/poor:19665} compliance with treatment. She {is/is not:9024} having side effects. {document side effects if present:1}  Outside blood pressures are {enter patient reported home BP, or 'not being checked':1}. --------------------------------------------------------------------------------------------------- Lipid/Cholesterol, follow-up  Last Lipid Panel: Lab Results  Component Value Date   CHOL 217 (H) 07/31/2022   LDLCALC 140 (H) 07/31/2022   HDL 50  07/31/2022   TRIG 324 (H) 07/31/2022    She was last seen for this 3 months ago.  Management since that visit includes start Crestor 5 mg daily.  She reports {excellent/good/fair/poor:19665} compliance with treatment. She {is/is not:9024} having side effects. {document side effects if present:1}  Last metabolic panel Lab Results  Component Value Date   GLUCOSE 119 (H) 07/31/2022   NA 138 07/31/2022   K 4.2 07/31/2022   BUN 14 07/31/2022   CREATININE 0.72 07/31/2022   EGFR 87 07/31/2022   GFRNONAA 89 07/20/2020   CALCIUM 10.1 07/31/2022   AST 23 07/31/2022   ALT 20 07/31/2022   The 10-year ASCVD risk score (Arnett DK, et al., 2019) is: 42%  ---------------------------------------------------------------------------------------------------   Medications: Outpatient Medications Prior to Visit  Medication Sig   aspirin 81 MG EC tablet Take 81 mg by mouth daily.   levothyroxine (SYNTHROID) 112 MCG tablet TAKE 1 TABLET(112 MCG) BY MOUTH DAILY   lisinopril-hydrochlorothiazide (ZESTORETIC) 10-12.5 MG tablet TAKE 1 TABLET BY MOUTH DAILY   rosuvastatin (CRESTOR) 5 MG tablet Take 1 tablet (5 mg total) by mouth daily.   VITAMIN D, CHOLECALCIFEROL, PO Take 2,000 Units by mouth daily.   No facility-administered medications prior to visit.    Review of Systems  Constitutional:  Negative for appetite change and fatigue.  Eyes:  Negative for visual disturbance.  Respiratory:  Negative for chest tightness and shortness of breath.   Cardiovascular:  Negative for chest pain and leg swelling.  Gastrointestinal:  Negative for abdominal pain, nausea and vomiting.  Neurological:  Negative for dizziness, light-headedness, numbness and headaches.    {Labs  Heme  Chem  Endocrine  Serology  Results Review (optional):23779}   Objective    There were no  vitals taken for this visit. BP Readings from Last 3 Encounters:  11/03/22 132/77  07/31/22 123/61  02/06/22 121/74   Wt Readings  from Last 3 Encounters:  11/03/22 140 lb (63.5 kg)  07/31/22 141 lb 11.2 oz (64.3 kg)  02/06/22 137 lb (62.1 kg)      Physical Exam  ***  No results found for any visits on 02/01/23.  Assessment & Plan     ***  No follow-ups on file.      {provider attestation***:1}   Shirlee Latch, MD  [facility] [phone] (phone) [phone] (fax)  [facility]

## 2023-02-01 ENCOUNTER — Ambulatory Visit (INDEPENDENT_AMBULATORY_CARE_PROVIDER_SITE_OTHER): Payer: Medicare Other | Admitting: Family Medicine

## 2023-02-01 ENCOUNTER — Encounter: Payer: Self-pay | Admitting: Family Medicine

## 2023-02-01 VITALS — BP 130/79 | HR 92 | Temp 97.9°F | Resp 12 | Wt 138.6 lb

## 2023-02-01 DIAGNOSIS — I152 Hypertension secondary to endocrine disorders: Secondary | ICD-10-CM | POA: Diagnosis not present

## 2023-02-01 DIAGNOSIS — E1159 Type 2 diabetes mellitus with other circulatory complications: Secondary | ICD-10-CM | POA: Diagnosis not present

## 2023-02-01 DIAGNOSIS — E039 Hypothyroidism, unspecified: Secondary | ICD-10-CM | POA: Diagnosis not present

## 2023-02-01 DIAGNOSIS — E785 Hyperlipidemia, unspecified: Secondary | ICD-10-CM | POA: Diagnosis not present

## 2023-02-01 DIAGNOSIS — D531 Other megaloblastic anemias, not elsewhere classified: Secondary | ICD-10-CM | POA: Diagnosis not present

## 2023-02-01 DIAGNOSIS — E538 Deficiency of other specified B group vitamins: Secondary | ICD-10-CM

## 2023-02-01 DIAGNOSIS — E1169 Type 2 diabetes mellitus with other specified complication: Secondary | ICD-10-CM

## 2023-02-01 MED ORDER — CYANOCOBALAMIN 1000 MCG/ML IJ SOLN
1000.0000 ug | Freq: Once | INTRAMUSCULAR | Status: AC
  Administered 2023-02-01: 1000 ug via INTRAMUSCULAR

## 2023-02-01 NOTE — Assessment & Plan Note (Signed)
Chronic and well controlled. Continue current medications. Ordered TSH

## 2023-02-01 NOTE — Assessment & Plan Note (Signed)
Diagnosed at last visit. Managed with diet and exercise.  Patient will follow up with ophthalmologist for eye exam.  Ordered A1c.

## 2023-02-01 NOTE — Assessment & Plan Note (Signed)
Chronic and well controlled  Ordered CBC and B12. Gave B12 injection at today's visit.

## 2023-02-01 NOTE — Assessment & Plan Note (Signed)
Chronic and started Crestor 5mg  at previous visit. Continue current medications.  Ordered CMP and FLP.

## 2023-02-01 NOTE — Assessment & Plan Note (Signed)
Well controlled.  Ordered CBC and B12. Gave B12 injection at today's visit.

## 2023-02-01 NOTE — Assessment & Plan Note (Signed)
Chronic and well controlled.  Continue current medications.  Ordered CMP.  

## 2023-02-01 NOTE — Progress Notes (Signed)
Established patient visit   Patient: Michaela Weber   DOB: [birth year]   77 y.o. Female  MRN: [id number] Visit Date: 02/01/2023  Today's healthcare provider: Shirlee Latch, MD   Chief Complaint  Patient presents with   Diabetes   Hypertension   Hyperlipidemia   Subjective    HPI  Diabetes Mellitus Type II, follow-up  Lab Results  Component Value Date   HGBA1C 6.7 (A) 11/03/2022   HGBA1C 6.5 (H) 07/31/2022   HGBA1C 6.4 (H) 02/06/2022   Last seen for diabetes 3 months ago.  Management since then includes no changes. No oral medication.   Home blood sugar records: Not tracking blood sugar at home.  Episodes of hypoglycemia? No    Current insulin regiment: none Most Recent Eye Exam: None, already has an ophthalmologist.   --------------------------------------------------------------------------------------------------- Hypertension, follow-up  BP Readings from Last 3 Encounters:  02/01/23 130/79  11/03/22 132/77  07/31/22 123/61   Wt Readings from Last 3 Encounters:  02/01/23 138 lb 9.6 oz (62.9 kg)  11/03/22 140 lb (63.5 kg)  07/31/22 141 lb 11.2 oz (64.3 kg)     She was last seen for hypertension 3 months ago.  BP at that visit was 132/77. Management since that visit includes no changes. She reports excellent compliance with treatment. She is not having side effects.   Not checking blood pressure at home.  BP in office today is 130/79 --------------------------------------------------------------------------------------------------- Lipid/Cholesterol, follow-up  Last Lipid Panel: Lab Results  Component Value Date   CHOL 217 (H) 07/31/2022   LDLCALC 140 (H) 07/31/2022   HDL 50 07/31/2022   TRIG 152 (H) 07/31/2022    She was last seen for this 3 months ago.  Management since that visit includes start Crestor 5 mg daily.  She reports excellent compliance with treatment. She is not having side effects.   Last metabolic panel Lab Results   Component Value Date   GLUCOSE 119 (H) 07/31/2022   NA 138 07/31/2022   K 4.2 07/31/2022   BUN 14 07/31/2022   CREATININE 0.72 07/31/2022   EGFR 87 07/31/2022   GFRNONAA 89 07/20/2020   CALCIUM 10.1 07/31/2022   AST 23 07/31/2022   ALT 20 07/31/2022   The 10-year ASCVD risk score (Arnett DK, et al., 2019) is: 41%  ---------------------------------------------------------------------------------------------------   Medications: Outpatient Medications Prior to Visit  Medication Sig   aspirin 81 MG EC tablet Take 81 mg by mouth daily.   levothyroxine (SYNTHROID) 112 MCG tablet TAKE 1 TABLET(112 MCG) BY MOUTH DAILY   lisinopril-hydrochlorothiazide (ZESTORETIC) 10-12.5 MG tablet TAKE 1 TABLET BY MOUTH DAILY   rosuvastatin (CRESTOR) 5 MG tablet Take 1 tablet (5 mg total) by mouth daily.   VITAMIN D, CHOLECALCIFEROL, PO Take 2,000 Units by mouth daily.   No facility-administered medications prior to visit.    Review of Systems  Constitutional:  Negative for appetite change and fatigue.  Eyes:  Negative for visual disturbance.  Respiratory:  Negative for chest tightness and shortness of breath.   Cardiovascular:  Negative for chest pain and leg swelling.  Gastrointestinal:  Negative for abdominal pain, nausea and vomiting.  Neurological:  Negative for dizziness, light-headedness, numbness and headaches.       Objective    BP 130/79 (BP Location: Left Arm, Patient Position: Sitting, Cuff Size: Large)   Pulse 92   Temp 97.9 F (36.6 C) (Temporal)   Resp 12   Wt 138 lb 9.6 oz (62.9 kg)   SpO2 97%   BMI  25.35 kg/m  BP Readings from Last 3 Encounters:  02/01/23 130/79  11/03/22 132/77  07/31/22 123/61   Wt Readings from Last 3 Encounters:  02/01/23 138 lb 9.6 oz (62.9 kg)  11/03/22 140 lb (63.5 kg)  07/31/22 141 lb 11.2 oz (64.3 kg)      Physical Exam Constitutional:      General: She is not in acute distress. HENT:     Head: Normocephalic and atraumatic.   Cardiovascular:     Pulses: Normal pulses.     Heart sounds: Normal heart sounds.  Pulmonary:     Effort: Pulmonary effort is normal.     Breath sounds: Normal breath sounds.  Musculoskeletal:     Cervical back: Normal range of motion.  Skin:    General: Skin is warm and dry.  Neurological:     Mental Status: She is alert.       No results found for any visits on 02/01/23.  Assessment & Plan  1. Type 2 diabetes mellitus with other specified complication, without long-term current use of insulin (HCC) Diagnosed at last visit. Managed with diet and exercise.  Patient will follow up with ophthalmologist for eye exam.  Ordered A1c. - Hemoglobin A1c  2. Hyperlipidemia associated with type 2 diabetes mellitus (HCC) Chronic and started Crestor 5mg  at previous visit. Continue current medications.  Ordered CMP and FLP.  - Comprehensive metabolic panel - Lipid panel  3. Hypertension associated with diabetes (HCC) Chronic and well controlled. Continue current medications.  Ordered CMP. - Comprehensive metabolic panel  4. B12 deficiency 5. Megaloblastic anemia due to B12 deficiency Chronic and well controlled  Ordered CBC and B12. Gave B12 injection at today's visit. - cyanocobalamin (VITAMIN B12) injection 1,000 mcg - B12 - CBC  6. Adult hypothyroidism Chronic and well controlled. Continue current medications. Ordered TSH - TSH   Return in about 6 months (around 08/04/2023) for CPE, AWV.     Ezekiel Slocumb, MS3 [facility]   Patient seen along with MS3 student Ezekiel Slocumb. I personally evaluated this patient along with the student, and verified all aspects of the history, physical exam, and medical decision making as documented by the student. I agree with the student's documentation and have made all necessary edits.  Anapaola Kinsel, Marzella Schlein, MD, MPH [facility]

## 2023-02-02 LAB — COMPREHENSIVE METABOLIC PANEL
ALT: 15 IU/L (ref 0–32)
AST: 17 IU/L (ref 0–40)
Albumin/Globulin Ratio: 2 (ref 1.2–2.2)
Albumin: 4.3 g/dL (ref 3.8–4.8)
Alkaline Phosphatase: 109 IU/L (ref 44–121)
BUN/Creatinine Ratio: 22 (ref 12–28)
BUN: 13 mg/dL (ref 8–27)
Bilirubin Total: 0.4 mg/dL (ref 0.0–1.2)
CO2: 25 mmol/L (ref 20–29)
Calcium: 9.9 mg/dL (ref 8.7–10.3)
Chloride: 101 mmol/L (ref 96–106)
Creatinine, Ser: 0.6 mg/dL (ref 0.57–1.00)
Globulin, Total: 2.2 g/dL (ref 1.5–4.5)
Glucose: 127 mg/dL — ABNORMAL HIGH (ref 70–99)
Potassium: 4.5 mmol/L (ref 3.5–5.2)
Sodium: 141 mmol/L (ref 134–144)
Total Protein: 6.5 g/dL (ref 6.0–8.5)
eGFR: 93 mL/min/{1.73_m2} (ref >59)

## 2023-02-02 LAB — LIPID PANEL
Chol/HDL Ratio: 2.5 ratio (ref 0.0–4.4)
Cholesterol, Total: 144 mg/dL (ref 100–199)
HDL: 57 mg/dL (ref >39)
LDL Chol Calc (NIH): 72 mg/dL (ref 0–99)
Triglycerides: 78 mg/dL (ref 0–149)
VLDL Cholesterol Cal: 15 mg/dL (ref 5–40)

## 2023-02-02 LAB — CBC
Hematocrit: 39.3 % (ref 34.0–46.6)
Hemoglobin: 12.8 g/dL (ref 11.1–15.9)
MCH: 25.4 pg — ABNORMAL LOW (ref 26.6–33.0)
MCHC: 32.6 g/dL (ref 31.5–35.7)
MCV: 78 fL — ABNORMAL LOW (ref 79–97)
Platelets: 374 10*3/uL (ref 150–450)
RBC: 5.04 x10E6/uL (ref 3.77–5.28)
RDW: 13.4 % (ref 11.7–15.4)
WBC: 7.9 10*3/uL (ref 3.4–10.8)

## 2023-02-02 LAB — TSH: TSH: 0.281 u[IU]/mL — ABNORMAL LOW (ref 0.450–4.500)

## 2023-02-02 LAB — HEMOGLOBIN A1C
Est. average glucose Bld gHb Est-mCnc: 143 mg/dL
Hgb A1c MFr Bld: 6.6 % — ABNORMAL HIGH (ref 4.8–5.6)

## 2023-02-02 LAB — VITAMIN B12: Vitamin B-12: 487 pg/mL (ref 232–1245)

## 2023-02-07 ENCOUNTER — Telehealth: Payer: Self-pay

## 2023-02-07 DIAGNOSIS — E039 Hypothyroidism, unspecified: Secondary | ICD-10-CM

## 2023-02-07 NOTE — Telephone Encounter (Signed)
-----   Message from Erasmo Downer, MD sent at 02/02/2023  8:08 AM EDT ----- Normal/stable labs, except thyroid hormone is slightly low, meaning that synthroid dose may be slightly too much.  If not having symptoms, we could continue current dose and recheck in 2 months, but if having any hair/skin changes, palpitations, diarrhea, would decrease dose to 100 mcg daily and recheck TSH in 2 months. Ok to send new Rx if needed.

## 2023-02-26 ENCOUNTER — Other Ambulatory Visit: Payer: Self-pay | Admitting: Family Medicine

## 2023-02-28 ENCOUNTER — Ambulatory Visit (INDEPENDENT_AMBULATORY_CARE_PROVIDER_SITE_OTHER): Payer: Medicare Other

## 2023-02-28 DIAGNOSIS — E538 Deficiency of other specified B group vitamins: Secondary | ICD-10-CM

## 2023-02-28 MED ORDER — CYANOCOBALAMIN 1000 MCG/ML IJ SOLN
1000.0000 ug | Freq: Once | INTRAMUSCULAR | Status: AC
Start: 2023-02-28 — End: 2023-02-28
  Administered 2023-02-28: 1000 ug via INTRAMUSCULAR

## 2023-03-23 ENCOUNTER — Telehealth: Payer: Self-pay | Admitting: Family Medicine

## 2023-03-23 ENCOUNTER — Other Ambulatory Visit: Payer: Self-pay | Admitting: Family Medicine

## 2023-03-23 DIAGNOSIS — E039 Hypothyroidism, unspecified: Secondary | ICD-10-CM

## 2023-03-23 DIAGNOSIS — I10 Essential (primary) hypertension: Secondary | ICD-10-CM

## 2023-03-23 MED ORDER — LEVOTHYROXINE SODIUM 112 MCG PO TABS
ORAL_TABLET | ORAL | 2 refills | Status: DC
Start: 2023-03-23 — End: 2023-03-30

## 2023-03-23 NOTE — Telephone Encounter (Signed)
[facility] pharmacy requesting prescription refill levothyroxine (SYNTHROID) 112 MCG tablet  Please advise

## 2023-03-28 ENCOUNTER — Ambulatory Visit (INDEPENDENT_AMBULATORY_CARE_PROVIDER_SITE_OTHER): Payer: Medicare Other

## 2023-03-28 DIAGNOSIS — E039 Hypothyroidism, unspecified: Secondary | ICD-10-CM | POA: Diagnosis not present

## 2023-03-28 DIAGNOSIS — E538 Deficiency of other specified B group vitamins: Secondary | ICD-10-CM | POA: Diagnosis not present

## 2023-03-28 MED ORDER — CYANOCOBALAMIN 1000 MCG/ML IJ SOLN
1000.00 ug | Freq: Once | INTRAMUSCULAR | Status: AC
Start: 2023-03-28 — End: 2023-03-28
  Administered 2023-03-28: 1000 ug via INTRAMUSCULAR

## 2023-03-29 LAB — TSH: TSH: 0.092 u[IU]/mL — ABNORMAL LOW (ref 0.450–4.500)

## 2023-03-30 ENCOUNTER — Telehealth: Payer: Self-pay

## 2023-03-30 DIAGNOSIS — E039 Hypothyroidism, unspecified: Secondary | ICD-10-CM

## 2023-03-30 MED ORDER — LEVOTHYROXINE SODIUM 100 MCG PO TABS
ORAL_TABLET | ORAL | 1 refills | Status: DC
Start: 2023-03-30 — End: 2023-08-02

## 2023-03-30 NOTE — Telephone Encounter (Signed)
-----   Message from Karma Lew, RN sent at 03/30/2023  9:52 AM EDT ----- Pt called back is agreeable to decreased dose of Synthroid. Advised pt to come in in 2 months for TSH. Need order added.

## 2023-04-02 ENCOUNTER — Other Ambulatory Visit: Payer: Self-pay | Admitting: Family Medicine

## 2023-04-02 DIAGNOSIS — Z1231 Encounter for screening mammogram for malignant neoplasm of breast: Secondary | ICD-10-CM

## 2023-04-23 ENCOUNTER — Ambulatory Visit: Payer: Self-pay

## 2023-04-23 NOTE — Patient Outreach (Signed)
  Care Coordination   04/23/2023 Name: Shalie Tredway MRN: [id number] DOB: [birth year]   Care Coordination Outreach Attempts:  An unsuccessful telephone outreach was attempted today to offer the patient information about available care coordination services.  Follow Up Plan:  Additional outreach attempts will be made to offer the patient care coordination information and services.   Encounter Outcome:  No Answer   Care Coordination Interventions:  No, not indicated    SIG Lysle Morales, BSW Social Worker [facility] Management  [phone]

## 2023-04-25 ENCOUNTER — Ambulatory Visit (INDEPENDENT_AMBULATORY_CARE_PROVIDER_SITE_OTHER): Payer: Medicare Other

## 2023-04-25 DIAGNOSIS — E538 Deficiency of other specified B group vitamins: Secondary | ICD-10-CM | POA: Diagnosis not present

## 2023-04-25 MED ORDER — CYANOCOBALAMIN 1000 MCG/ML IJ SOLN
1000.0000 ug | Freq: Once | INTRAMUSCULAR | Status: AC
Start: 2023-04-25 — End: 2023-04-25
  Administered 2023-04-25: 1000 ug via INTRAMUSCULAR

## 2023-04-30 ENCOUNTER — Ambulatory Visit: Payer: Self-pay

## 2023-04-30 NOTE — Patient Outreach (Signed)
  Care Coordination   04/30/2023 Name: Michaela Weber MRN: [id number] DOB: 12/03/1945   Care Coordination Outreach Attempts:  An unsuccessful telephone outreach was attempted today to offer the patient information about available care coordination services.  Follow Up Plan:  Additional outreach attempts will be made to offer the patient care coordination information and services.   Encounter Outcome:  No Answer   Care Coordination Interventions:  No, not indicated    SIG Lysle Morales, BSW Social Worker [facility]  [phone]

## 2023-05-04 ENCOUNTER — Ambulatory Visit: Payer: Self-pay

## 2023-05-04 ENCOUNTER — Telehealth: Payer: Self-pay

## 2023-05-04 NOTE — Patient Outreach (Signed)
  Care Coordination   Initial Visit Note   05/04/2023 Name: Samanvitha Dorko MRN: [id number] DOB: 09/28/1945  Emili Benjie Worthan is a 77 y.o. year old female who sees Bacigalupo, Marzella Schlein, MD for primary care. I spoke with  Moshe Cipro by phone today.  What matters to the patients health and wellness today?  CM educated patient on [facility] services.  Patient declines services and feels that she is able to manage her medical and pharmacy needs.  Patient agreed to contact her provider in the future if she needs [facility] services.    Goals Addressed   None     SDOH assessments and interventions completed:  No     Care Coordination Interventions:  Yes, provided  Interventions Today    Flowsheet Row Most Recent Value  General Interventions   General Interventions Discussed/Reviewed General Interventions Discussed  Education Interventions   Education Provided Provided Education  Provided Verbal Education On --  [facility] services]  Pharmacy Interventions   Pharmacy Dicussed/Reviewed Pharmacy Topics Discussed  [Medication are managed wel]        Follow up plan: No further intervention required.   Encounter Outcome:  Pt. Visit Completed

## 2023-05-04 NOTE — Telephone Encounter (Signed)
Please see documentation for 04/30/23 visit   Copied from CRM #[id number]. Topic: General - Other >> May 04, 2023  3:36 PM Epimenio Foot F wrote: Reason for CRM: Pt is calling in because she saw an AVS for 04/30/23 and per pt she didn't come in or speak to anyone and wants to know what's the reason for it showing up. Please follow up with pt.

## 2023-05-08 ENCOUNTER — Ambulatory Visit: Payer: Self-pay

## 2023-05-08 NOTE — Patient Outreach (Signed)
  Care Coordination   05/08/2023 Name: Michaela Weber MRN: [id number] DOB: 1946-07-24   Care Coordination Outreach Attempts:  A third unsuccessful outreach was attempted today to offer the patient with information about available care coordination services.  Follow Up Plan:  No further outreach attempts will be made at this time. We have been unable to contact the patient to offer or enroll patient in care coordination services  Encounter Outcome:  No Answer   Care Coordination Interventions:  No, not indicated    SIG Lysle Morales, BSW Social Worker [facility]  [phone]

## 2023-05-21 ENCOUNTER — Ambulatory Visit
Admission: RE | Admit: 2023-05-21 | Discharge: 2023-05-21 | Disposition: A | Discharge status: Home / Self Care | Payer: Medicare Other | Source: Ambulatory Visit | Attending: Family Medicine | Admitting: Family Medicine

## 2023-05-21 DIAGNOSIS — Z1231 Encounter for screening mammogram for malignant neoplasm of breast: Secondary | ICD-10-CM | POA: Insufficient documentation

## 2023-05-23 ENCOUNTER — Ambulatory Visit (INDEPENDENT_AMBULATORY_CARE_PROVIDER_SITE_OTHER): Payer: Medicare Other

## 2023-05-23 DIAGNOSIS — E538 Deficiency of other specified B group vitamins: Secondary | ICD-10-CM

## 2023-05-23 MED ORDER — CYANOCOBALAMIN 1000 MCG/ML IJ SOLN
1000.0000 ug | Freq: Once | INTRAMUSCULAR | Status: AC
Start: 2023-05-23 — End: 2023-05-23
  Administered 2023-05-23: 1000 ug via INTRAMUSCULAR

## 2023-06-04 NOTE — Telephone Encounter (Signed)
The call on 04/30/23 was for care coordination and not an AWV.  No AWV documented or charged.

## 2023-06-20 ENCOUNTER — Ambulatory Visit (INDEPENDENT_AMBULATORY_CARE_PROVIDER_SITE_OTHER): Payer: Medicare Other

## 2023-06-20 DIAGNOSIS — E538 Deficiency of other specified B group vitamins: Secondary | ICD-10-CM

## 2023-06-20 MED ORDER — CYANOCOBALAMIN 1000 MCG/ML IJ SOLN
1000.0000 ug | Freq: Once | INTRAMUSCULAR | Status: AC
Start: 2023-06-20 — End: 2023-06-20
  Administered 2023-06-20: 1000 ug via INTRAMUSCULAR

## 2023-06-21 ENCOUNTER — Other Ambulatory Visit: Payer: Self-pay | Admitting: Family Medicine

## 2023-06-21 DIAGNOSIS — I10 Essential (primary) hypertension: Secondary | ICD-10-CM

## 2023-06-21 NOTE — Telephone Encounter (Signed)
Requested Prescriptions  Pending Prescriptions Disp Refills   lisinopril-hydrochlorothiazide (ZESTORETIC) 10-12.5 MG tablet [Pharmacy Med Name: LISINOPRIL-HCTZ 10/12.5MG  TABLETS] 90 tablet 0    Sig: TAKE 1 TABLET BY MOUTH DAILY     Cardiovascular:  ACEI + Diuretic Combos Passed - 06/21/2023  7:04 AM      Passed - Na in normal range and within 180 days    Sodium  Date Value Ref Range Status  02/01/2023 141 134 - 144 mmol/L Final         Passed - K in normal range and within 180 days    Potassium  Date Value Ref Range Status  02/01/2023 4.5 3.5 - 5.2 mmol/L Final         Passed - Cr in normal range and within 180 days    Creat  Date Value Ref Range Status  07/19/2017 0.63 0.60 - 0.93 mg/dL Final    Comment:    For patients >63 years of age, the reference limit for Creatinine is approximately 13% higher for people identified as African-American. .    Creatinine, Ser  Date Value Ref Range Status  02/01/2023 0.60 0.57 - 1.00 mg/dL Final         Passed - eGFR is 30 or above and within 180 days    GFR, Est African American  Date Value Ref Range Status  07/19/2017 105 > OR = 60 mL/min/1.52m2 Final   GFR calc Af Amer  Date Value Ref Range Status  07/20/2020 102 >59 mL/min/1.73 Final    Comment:    **In accordance with recommendations from the NKF-ASN Task force,**   [facility] is in the process of updating its eGFR calculation to the   2021 CKD-EPI creatinine equation that estimates kidney function   without a race variable.    GFR, Est Non African American  Date Value Ref Range Status  07/19/2017 90 > OR = 60 mL/min/1.37m2 Final   GFR calc non Af Amer  Date Value Ref Range Status  07/20/2020 89 >59 mL/min/1.73 Final   eGFR  Date Value Ref Range Status  02/01/2023 93 >59 mL/min/1.73 Final         Passed - Patient is not pregnant      Passed - Last BP in normal range    BP Readings from Last 1 Encounters:  02/01/23 130/79         Passed - Valid encounter within  last 6 months    Recent Outpatient Visits           4 months ago Type 2 diabetes mellitus with other specified complication, without long-term current use of insulin (HCC)   [place] [facility] [place], Marzella Schlein, MD   6 months ago B12 deficiency   [facility] [place], Marzella Schlein, MD   7 months ago Hyperlipidemia associated with type 2 diabetes mellitus [facility])   [place] [facility] [place], Marzella Schlein, MD   10 months ago Encounter for [employer] annual wellness exam   [facility] [place], Marzella Schlein, MD   1 year ago Essential hypertension   [place] [facility] [place], Marzella Schlein, MD       Future Appointments             In 1 month Bacigalupo, Marzella Schlein, MD [facility]

## 2023-06-25 ENCOUNTER — Telehealth: Payer: Self-pay | Admitting: Family Medicine

## 2023-06-25 NOTE — Telephone Encounter (Signed)
[facility] pharmacy is requesting prescription refill rosuvastatin (CRESTOR) 5 MG tablet   Please advise

## 2023-06-26 DIAGNOSIS — E039 Hypothyroidism, unspecified: Secondary | ICD-10-CM | POA: Diagnosis not present

## 2023-06-26 MED ORDER — ROSUVASTATIN CALCIUM 5 MG PO TABS: ORAL_TABLET | ORAL | 3 refills | Status: DC

## 2023-06-27 LAB — TSH: TSH: 0.739 u[IU]/mL (ref 0.450–4.500)

## 2023-07-10 ENCOUNTER — Ambulatory Visit: Payer: Medicare Other

## 2023-07-10 VITALS — Ht 62.0 in | Wt 136.0 lb

## 2023-07-10 DIAGNOSIS — Z0000 Encounter for general adult medical examination without abnormal findings: Secondary | ICD-10-CM

## 2023-07-10 NOTE — Patient Instructions (Signed)
Michaela Weber , Thank you for taking time to come for your Medicare Wellness Visit. I appreciate your ongoing commitment to your health goals. Please review the following plan we discussed and let me know if I can assist you in the future.   Referrals/Orders/Follow-Ups/Clinician Recommendations: none  This is a list of the screening recommended for you and due dates:  Health Maintenance  Topic Date Due   Eye exam for diabetics  07/10/2023*   COVID-19 Vaccine (1 - 2023-24 season) 07/26/2023*   Zoster (Shingles) Vaccine (1 of 2) 10/10/2023*   Flu Shot  12/24/2023*   DTaP/Tdap/Td vaccine (1 - Tdap) 07/09/2024*   DEXA scan (bone density measurement)  07/09/2024*   Hemoglobin A1C  08/04/2023   Yearly kidney health urinalysis for diabetes  11/04/2023   Complete foot exam   11/04/2023   Yearly kidney function blood test for diabetes  02/01/2024   Medicare Annual Wellness Visit  07/09/2024   Pneumonia Vaccine  Completed   Hepatitis C Screening  Completed   HPV Vaccine  Aged Out   Colon Cancer Screening  Discontinued  *Topic was postponed. The date shown is not the original due date.    Advanced directives: (Declined) Advance directive discussed with you today. Even though you declined this today, please call our office should you change your mind, and we can give you the proper paperwork for you to fill out.  Next Medicare Annual Wellness Visit scheduled for next year: Yes 07/16/24 @ 8:55am telephone

## 2023-07-10 NOTE — Progress Notes (Signed)
Subjective:   Michaela Weber is a 77 y.o. female who presents for Medicare Annual (Subsequent) preventive examination.  Visit Complete: I conducted interview with Michaela Weber via telephone and prior verified her two patient identifiers.  Patient Medicare AWV questionnaire was completed by the patient on 07/07/23; I have confirmed that all information answered by patient is correct and no changes since this date.  Cardiac Risk Factors include: advanced age (>15men, >74 women);diabetes mellitus;dyslipidemia;hypertension    Objective:    Today's Vitals   07/10/23 0908  Weight: 136 lb (61.7 kg)  Height: 5\' 2"  (1.575 m)   Body mass index is 24.87 kg/m.     07/10/2023    9:23 AM 10/07/2021    8:18 AM 07/20/2020   10:00 AM 02/04/2019    9:16 AM 01/15/2018   10:06 AM 07/27/2017   11:32 AM 05/16/2017   10:13 AM  Advanced Directives  Does Patient Have a Medical Advance Directive? No No No No No No No  Would patient like information on creating a medical advance directive?  No - Patient declined Yes (ED - Information included in AVS) No - Patient declined Yes (MAU/Ambulatory/Procedural Areas - Information given)  Yes (ED - Information included in AVS)    Current Medications (verified) Outpatient Encounter Medications as of 07/10/2023  Medication Sig   aspirin 81 MG EC tablet Take 81 mg by mouth daily.   Cyanocobalamin (B-12 COMPLIANCE INJECTION) 1000 MCG/ML KIT Inject 1,000 mcg/mL as directed as directed. Once monthly injection   levothyroxine (SYNTHROID) 100 MCG tablet TAKE 1 TABLET(112 MCG) BY MOUTH DAILY   lisinopril-hydrochlorothiazide (ZESTORETIC) 10-12.5 MG tablet TAKE 1 TABLET BY MOUTH DAILY   rosuvastatin (CRESTOR) 5 MG tablet TAKE 1 TABLET(5 MG) BY MOUTH DAILY   VITAMIN D, CHOLECALCIFEROL, PO Take 2,000 Units by mouth daily.   No facility-administered encounter medications on file as of 07/10/2023.    Allergies (verified) Codeine   History: Past Medical History:   Diagnosis Date   Allergy    Codeine   Anemia    in past   Diastolic dysfunction    Hyperlipidemia    Hypertension    Hypothyroidism    Valvular disease    mild   Wears contact lenses    Past Surgical History:  Procedure Laterality Date   CESAREAN SECTION     x2    COLONOSCOPY  approx 2007   COLONOSCOPY N/A 10/07/2021   Procedure: COLONOSCOPY;  Surgeon: Midge Minium, MD;  Location: [facility];  Service: Endoscopy;  Laterality: N/A;   COLONOSCOPY WITH PROPOFOL N/A 04/07/2016   Procedure: COLONOSCOPY WITH PROPOFOL;  Surgeon: Midge Minium, MD;  Location: [facility];  Service: Endoscopy;  Laterality: N/A;   GANGLION CYST EXCISION     POLYPECTOMY  04/07/2016   Procedure: POLYPECTOMY;  Surgeon: Midge Minium, MD;  Location: [facility];  Service: Endoscopy;;   TONSILLECTOMY     TUBAL LIGATION     with 2nd c-sect   Family History  Problem Relation Age of Onset   Diabetes Father    Alzheimer's disease Mother    Healthy Brother    Breast cancer Neg Hx    Colon cancer Neg Hx    Ovarian cancer Neg Hx    Social History   Socioeconomic History   Marital status: Widowed    Spouse name: Barbie Croston   Number of children: 2   Years of education: 14   Highest education level: Associate degree: occupational, Scientist, product/process development, or vocational program  Occupational History    Employer: RETIRED  Tobacco Use   Smoking status: Never   Smokeless tobacco: Never   Tobacco comments:    tobacco use - no  Vaping Use   Vaping status: Never Used  Substance and Sexual Activity   Alcohol use: No   Drug use: No   Sexual activity: Not on file  Other Topics Concern   Not on file  Social History Narrative   Not on file   Social Determinants of Health   Financial Resource Strain: Low Risk  (07/07/2023)   Overall Financial Resource Strain (CARDIA)    Difficulty of Paying Living Expenses: Not hard at all  Food Insecurity: No Food Insecurity (07/07/2023)   Hunger Vital  Sign    Worried About Running Out of Food in the Last Year: Never true    Ran Out of Food in the Last Year: Never true  Transportation Needs: No Transportation Needs (07/07/2023)   PRAPARE - Administrator, Civil Service (Medical): No    Lack of Transportation (Non-Medical): No  Physical Activity: Insufficiently Active (07/07/2023)   Exercise Vital Sign    Days of Exercise per Week: 3 days    Minutes of Exercise per Session: 20 min  Stress: No Stress Concern Present (07/07/2023)   [employer] of Occupational Health - Occupational Stress Questionnaire    Feeling of Stress : Not at all  Social Connections: Unknown (07/07/2023)   Social Connection and Isolation Panel [NHANES]    Frequency of Communication with Friends and Family: More than three times a week    Frequency of Social Gatherings with Friends and Family: Twice a week    Attends Religious Services: Not on Marketing executive or Organizations: No    Attends Banker Meetings: Not on file    Marital Status: Widowed    Tobacco Counseling Counseling given: Not Answered Tobacco comments: tobacco use - no   Clinical Intake:  Pre-visit preparation completed: Yes  Pain : No/denies pain    BMI - recorded: 24.87 Nutritional Status: BMI of 19-24  Normal Nutritional Risks: None Diabetes: Yes CBG done?: No Did pt. bring in CBG monitor from home?: No  How often do you need to have someone help you when you read instructions, pamphlets, or other written materials from your doctor or pharmacy?: 1 - Never  Interpreter Needed?: No  Comments: lives alone Information entered by :: B.Laylaa Guevarra,LPN   Activities of Daily Living    07/10/2023    9:23 AM 07/07/2023    9:56 AM  In your present state of health, do you have any difficulty performing the following activities:  Hearing? 0 0  Vision? 0 0  Difficulty concentrating or making decisions? 0 0  Walking or climbing stairs? 0 0   Dressing or bathing? 0 0  Doing errands, shopping? 0 0  Preparing Food and eating ? N N  Using the Toilet? N N  In the past six months, have you accidently leaked urine? N N  Do you have problems with loss of bowel control? N N  Managing your Medications? N N  Managing your Finances? N N  Housekeeping or managing your Housekeeping? N N    Patient Care Team: Erasmo Downer, MD as PCP - General (Family Medicine) Pllc, [facility]  Indicate any recent Medical Services you may have received from other than [facility] providers in the past year (date may be approximate).  Assessment:   This is a routine wellness examination for Michaela Weber.  Hearing/Vision screen Hearing Screening - Comments:: Pt says her hearing is good Vision Screening - Comments:: Pt says her vision is good does: not need glasses anymore    Goals Addressed             This Visit's Progress    COMPLETED: DIET - INCREASE WATER INTAKE   On track    Recommend increasing water intake to 4-6 glasses a day.      Patient Stated       Pt would like to adjust her diet, exercise to decrease A1C     COMPLETED: Prevent falls   On track    Recommend to remove any items from the home that may cause slips or trips.       Depression Screen    07/10/2023    9:19 AM 11/03/2022    8:16 AM 07/31/2022    9:19 AM 07/26/2021    9:18 AM 07/20/2020    9:58 AM 06/16/2019    9:55 AM 02/04/2019    9:18 AM  PHQ 2/9 Scores  PHQ - 2 Score 0 0 0 0 0 0 0  PHQ- 9 Score  0 0 0  0 1    Fall Risk    07/07/2023    9:56 AM 11/03/2022    8:15 AM 07/31/2022    9:19 AM 07/30/2022   11:04 AM 07/26/2021    9:18 AM  Fall Risk   Falls in the past year? 0 0 0 0 0  Number falls in past yr: 0 0 0  0  Injury with Fall? 0 0 0  0  Risk for fall due to : No Fall Risks No Fall Risks No Fall Risks  No Fall Risks  Follow up Falls prevention discussed;Education provided Falls evaluation completed Falls evaluation completed   Falls evaluation completed    MEDICARE RISK AT HOME: Medicare Risk at Home Any stairs in or around the home?: Yes If so, are there any without handrails?: Yes Home free of loose throw rugs in walkways, pet beds, electrical cords, etc?: Yes Adequate lighting in your home to reduce risk of falls?: Yes Life alert?: No Use of a cane, walker or w/c?: No Grab bars in the bathroom?: No Shower chair or bench in shower?: No  TIMED UP AND GO:  Was the test performed?  No    Cognitive Function:        07/10/2023    9:23 AM 07/26/2021    9:20 AM 05/16/2017   10:17 AM  6CIT Screen  What Year? 0 points 0 points 0 points  What month? 0 points 0 points 0 points  What time? 0 points 0 points 0 points  Count back from 20 0 points 0 points 0 points  Months in reverse 0 points 0 points 0 points  Repeat phrase 0 points 0 points 0 points  Total Score 0 points 0 points 0 points    Immunizations Immunization History  Administered Date(s) Administered   Fluad Quad(high Dose 65+) 06/16/2019   Influenza, High Dose Seasonal PF 07/18/2016, 07/19/2018   Influenza-Unspecified 06/22/2014   Pneumococcal Conjugate-13 06/22/2014   Pneumococcal Polysaccharide-23 11/24/2011    TDAP status: Up to date  Flu Vaccine status: Declined, Education has been provided regarding the importance of this vaccine but patient still declined. Advised may receive this vaccine at local pharmacy or Health Dept. Aware to provide a copy of the vaccination record if  obtained from local pharmacy or Health Dept. Verbalized acceptance and understanding.  Pneumococcal vaccine status: Up to date  Covid-19 vaccine status: Declined, Education has been provided regarding the importance of this vaccine but patient still declined. Advised may receive this vaccine at local pharmacy or Health Dept.or vaccine clinic. Aware to provide a copy of the vaccination record if obtained from local pharmacy or Health Dept. Verbalized acceptance and  understanding.  Qualifies for Shingles Vaccine? Yes   Zostavax completed No   Shingrix Completed?: No.    Education has been provided regarding the importance of this vaccine. Patient has been advised to call insurance company to determine out of pocket expense if they have not yet received this vaccine. Advised may also receive vaccine at local pharmacy or Health Dept. Verbalized acceptance and understanding.  Screening Tests Health Maintenance  Topic Date Due   OPHTHALMOLOGY EXAM  07/10/2023 (Originally 03/20/1956)   COVID-19 Vaccine (1 - 2023-24 season) 07/26/2023 (Originally 05/27/2023)   Zoster Vaccines- Shingrix (1 of 2) 10/10/2023 (Originally 03/20/1996)   INFLUENZA VACCINE  12/24/2023 (Originally 04/26/2023)   DTaP/Tdap/Td (1 - Tdap) 07/09/2024 (Originally 03/20/1965)   DEXA SCAN  07/09/2024 (Originally 02/23/2020)   HEMOGLOBIN A1C  08/04/2023   Diabetic kidney evaluation - Urine ACR  11/04/2023   FOOT EXAM  11/04/2023   Diabetic kidney evaluation - eGFR measurement  02/01/2024   Medicare Annual Wellness (AWV)  07/09/2024   Pneumonia Vaccine 17+ Years old  Completed   Hepatitis C Screening  Completed   HPV VACCINES  Aged Out   Colonoscopy  Discontinued    Health Maintenance  There are no preventive care reminders to display for this patient.   Colorectal cancer screening: No longer required.   Mammogram status: No longer required due to age.  Bone Density status: Completed 02/22/2018. Results reflect: Bone density results: NORMAL. Repeat every 3-5 years.  Lung Cancer Screening: (Low Dose CT Chest recommended if Age 74-80 years, 20 pack-year currently smoking OR have quit w/in 15years.) does not qualify.   Lung Cancer Screening Referral: no  Additional Screening:  Hepatitis C Screening: does not qualify; Completed 11/29/2011  Vision Screening: Recommended annual ophthalmology exams for early detection of glaucoma and other disorders of the eye. Is the patient up to date with  their annual eye exam?  No  Who is the provider or what is the name of the office in which the patient attends annual eye exams? [facility] If pt is not established with a provider, would they like to be referred to a provider to establish care? No .   Dental Screening: Recommended annual dental exams for proper oral hygiene  Diabetic Foot Exam: Diabetic Foot Exam: Completed 11/03/22  Community Resource Referral / Chronic Care Management: CRR required this visit?  No   CCM required this visit?  No    Plan:     I have personally reviewed and noted the following in the patient's chart:   Medical and social history Use of alcohol, tobacco or illicit drugs  Current medications and supplements including opioid prescriptions. Patient is not currently taking opioid prescriptions. Functional ability and status Nutritional status Physical activity Advanced directives List of other physicians Hospitalizations, surgeries, and ER visits in previous 12 months Vitals Screenings to include cognitive, depression, and falls Referrals and appointments  In addition, I have reviewed and discussed with patient certain preventive protocols, quality metrics, and best practice recommendations. A written personalized care plan for preventive services as well as general preventive  health recommendations were provided to patient.    Sue Lush, LPN   [id number]   After Visit Summary: (MyChart) Due to this being a telephonic visit, the after visit summary with patients personalized plan was offered to patient via MyChart   Nurse Notes: The patient states she is doing well and has no concerns or questions at this time. Pt does relays she does not want to do any of the care gaps or vaccines. She relays she will go for her eye exam maybe soon but she is having no problems.

## 2023-07-18 ENCOUNTER — Ambulatory Visit (INDEPENDENT_AMBULATORY_CARE_PROVIDER_SITE_OTHER): Payer: Medicare Other

## 2023-07-18 DIAGNOSIS — E538 Deficiency of other specified B group vitamins: Secondary | ICD-10-CM

## 2023-07-18 MED ORDER — CYANOCOBALAMIN 1000 MCG/ML IJ SOLN
1000.0000 ug | Freq: Once | INTRAMUSCULAR | Status: AC
Start: 2023-07-18 — End: 2023-07-18
  Administered 2023-07-18: 1000 ug via INTRAMUSCULAR

## 2023-07-18 NOTE — Progress Notes (Signed)
Patient tolerated B-12 injection well.

## 2023-08-02 ENCOUNTER — Ambulatory Visit: Payer: Medicare Other | Admitting: Family Medicine

## 2023-08-02 ENCOUNTER — Encounter: Payer: Self-pay | Admitting: Family Medicine

## 2023-08-02 VITALS — BP 136/70 | HR 100 | Ht 62.0 in | Wt 138.7 lb

## 2023-08-02 DIAGNOSIS — E1159 Type 2 diabetes mellitus with other circulatory complications: Secondary | ICD-10-CM | POA: Diagnosis not present

## 2023-08-02 DIAGNOSIS — I152 Hypertension secondary to endocrine disorders: Secondary | ICD-10-CM | POA: Diagnosis not present

## 2023-08-02 DIAGNOSIS — E039 Hypothyroidism, unspecified: Secondary | ICD-10-CM

## 2023-08-02 DIAGNOSIS — E785 Hyperlipidemia, unspecified: Secondary | ICD-10-CM | POA: Diagnosis not present

## 2023-08-02 DIAGNOSIS — Z0000 Encounter for general adult medical examination without abnormal findings: Secondary | ICD-10-CM

## 2023-08-02 DIAGNOSIS — E1169 Type 2 diabetes mellitus with other specified complication: Secondary | ICD-10-CM

## 2023-08-02 DIAGNOSIS — Z0001 Encounter for general adult medical examination with abnormal findings: Secondary | ICD-10-CM

## 2023-08-02 MED ORDER — LEVOTHYROXINE SODIUM 100 MCG PO TABS: 100.0000 ug | ORAL_TABLET | Freq: Every day | ORAL | 1 refills | Status: DC

## 2023-08-02 NOTE — Progress Notes (Signed)
Complete physical exam  Patient: Michaela Weber   DOB: May 25, 1946   77 y.o. Female  MRN: [id number]  Subjective:    Chief Complaint  Patient presents with   Annual Exam    AWV completed 07/10/23. Patient reports trying not to consume too much sodium and participating in exercise 2-3 times a week for 15-20 minutes. She reports feeling well and sleeping well. No other concerns to report.     Michaela Weber is a 77 y.o. female who presents today for a complete physical exam.  Discussed the use of AI scribe software for clinical note transcription with the patient, who gave verbal consent to proceed.  History of Present Illness   The patient, with a history of mitral valve prolapse, diabetes, and high cholesterol, presents for a routine physical examination. The patient expresses concern about a previous diagnosis of mitral valve prolapse, which has not been addressed in years. The patient reports no symptoms related to the mitral valve prolapse and has not had any follow-ups with a cardiologist since the initial diagnosis. The patient's diabetes and high cholesterol are managed with Lisinopril HCTZ and Crestor, respectively. The patient's thyroid function is managed with Synthroid. The patient has not had an eye exam in several years and acknowledges the need for one. The patient has also not received a tetanus shot in several years and is due for one.        Most recent fall risk assessment:    07/07/2023    9:56 AM  Fall Risk   Falls in the past year? 0  Number falls in past yr: 0  Injury with Fall? 0  Risk for fall due to : No Fall Risks  Follow up Falls prevention discussed;Education provided     Most recent depression screenings:    08/02/2023    8:38 AM 07/10/2023    9:19 AM  PHQ 2/9 Scores  PHQ - 2 Score  0  Exception Documentation Patient refusal         Patient Care Team: Erasmo Downer, MD as PCP - General (Family Medicine) Pllc, [facility]   Outpatient Medications Prior to Visit  Medication Sig   aspirin 81 MG EC tablet Take 81 mg by mouth daily.   Cyanocobalamin (B-12 COMPLIANCE INJECTION) 1000 MCG/ML KIT Inject 1,000 mcg/mL as directed as directed. Once monthly injection   lisinopril-hydrochlorothiazide (ZESTORETIC) 10-12.5 MG tablet TAKE 1 TABLET BY MOUTH DAILY   rosuvastatin (CRESTOR) 5 MG tablet TAKE 1 TABLET(5 MG) BY MOUTH DAILY   VITAMIN D, CHOLECALCIFEROL, PO Take 2,000 Units by mouth daily.   [DISCONTINUED] levothyroxine (SYNTHROID) 100 MCG tablet TAKE 1 TABLET(112 MCG) BY MOUTH DAILY   No facility-administered medications prior to visit.    ROS        Objective:     BP 136/70 (BP Location: Left Arm, Patient Position: Sitting, Cuff Size: Normal)   Pulse 100   Ht 5\' 2"  (1.575 m)   Wt 138 lb 11.2 oz (62.9 kg)   SpO2 98%   BMI 25.37 kg/m    Physical Exam Vitals reviewed.  Constitutional:      General: She is not in acute distress.    Appearance: Normal appearance. She is well-developed. She is not diaphoretic.  HENT:     Head: Normocephalic and atraumatic.     Right Ear: Tympanic membrane, ear canal and external ear normal.     Left Ear: Ear canal and external ear normal. There  is impacted cerumen.     Nose: Nose normal.     Mouth/Throat:     Mouth: Mucous membranes are moist.     Pharynx: Oropharynx is clear. No oropharyngeal exudate.  Eyes:     General: No scleral icterus.    Conjunctiva/sclera: Conjunctivae normal.     Pupils: Pupils are equal, round, and reactive to light.  Neck:     Thyroid: No thyromegaly.  Cardiovascular:     Rate and Rhythm: Normal rate and regular rhythm.     Heart sounds: Normal heart sounds. No murmur heard. Pulmonary:     Effort: Pulmonary effort is normal. No respiratory distress.     Breath sounds: Normal breath sounds. No wheezing or rales.  Abdominal:     General: There is no distension.     Palpations: Abdomen is soft.      Tenderness: There is no abdominal tenderness.  Musculoskeletal:        General: No deformity.     Cervical back: Neck supple.     Right lower leg: No edema.     Left lower leg: No edema.  Lymphadenopathy:     Cervical: No cervical adenopathy.  Skin:    General: Skin is warm and dry.     Findings: No rash.  Neurological:     Mental Status: She is alert and oriented to person, place, and time. Mental status is at baseline.     Gait: Gait normal.  Psychiatric:        Mood and Affect: Mood normal.        Behavior: Behavior normal.        Thought Content: Thought content normal.      No results found for any visits on 08/02/23.     Assessment & Plan:    Routine Health Maintenance and Physical Exam  Immunization History  Administered Date(s) Administered   Fluad Quad(high Dose 65+) 06/16/2019   Influenza, High Dose Seasonal PF 07/18/2016, 07/19/2018   Influenza-Unspecified 06/22/2014   Pneumococcal Conjugate-13 06/22/2014   Pneumococcal Polysaccharide-23 11/24/2011    Health Maintenance  Topic Date Due   OPHTHALMOLOGY EXAM  Never done   COVID-19 Vaccine (1 - 2023-24 season) Never done   Zoster Vaccines- Shingrix (1 of 2) 10/10/2023 (Originally 03/20/1996)   INFLUENZA VACCINE  12/24/2023 (Originally 04/26/2023)   DTaP/Tdap/Td (1 - Tdap) 07/09/2024 (Originally 03/20/1965)   DEXA SCAN  07/09/2024 (Originally 02/23/2020)   HEMOGLOBIN A1C  08/04/2023   Diabetic kidney evaluation - Urine ACR  11/04/2023   FOOT EXAM  11/04/2023   Diabetic kidney evaluation - eGFR measurement  02/01/2024   Medicare Annual Wellness (AWV)  07/09/2024   Pneumonia Vaccine 19+ Years old  Completed   Hepatitis C Screening  Completed   HPV VACCINES  Aged Out   Colonoscopy  Discontinued    Discussed health benefits of physical activity, and encouraged her to engage in regular exercise appropriate for her age and condition.  Problem List Items Addressed This Visit       Cardiovascular and Mediastinum    Hypertension associated with diabetes (HCC)   Relevant Orders   Comprehensive metabolic panel     Endocrine   Hyperlipidemia associated with type 2 diabetes mellitus (HCC)   Adult hypothyroidism   Relevant Medications   levothyroxine (SYNTHROID) 100 MCG tablet   Other Relevant Orders   TSH   T2DM (type 2 diabetes mellitus) (HCC)   Relevant Orders   Comprehensive metabolic panel   Hemoglobin A1c  Other Visit Diagnoses     Encounter for annual physical exam    -  Primary           Diabetes Mellitus Diabetes mellitus with a need for regular eye exams due to potential for diabetic retinopathy. Last eye exam was several years ago. Discussed importance of early detection of eye changes to prevent progression. - Schedule an eye exam to screen for diabetic retinopathy  Mitral Valve Prolapse Mitral valve prolapse with no current symptoms such as lightheadedness or shortness of breath. Last cardiology follow-up and echocardiogram were many years ago. Discussed that mitral valve prolapse often remains stable without symptoms. Advised to monitor for symptoms. - Monitor for symptoms such as lightheadedness or shortness of breath - No immediate follow-up with cardiology required unless symptoms develop  Hypertension Hypertension well-controlled with lisinopril/HCTZ 10/12.5 mg daily. Blood pressure is within normal limits. - Continue lisinopril/HCTZ 10/12.5 mg daily  Hyperlipidemia Hyperlipidemia well-controlled with rosuvastatin 5 mg daily. Cholesterol levels have halved since the last check. - Continue rosuvastatin 5 mg daily - Recheck cholesterol annually  Hypothyroidism Hypothyroidism managed with levothyroxine 100 mcg daily. Need to recheck thyroid function to ensure appropriate dosing. - Recheck thyroid function with labs today - Continue levothyroxine 100 mcg daily  General Health Maintenance General health maintenance discussed including vaccinations and screenings.  Tetanus shot may be due as last one may have been over 10 years ago. Shingles vaccination discussed previously. Discussed the importance of preventive measures such as tetanus shot to avoid complications from injuries. - Consider getting a tetanus shot at the pharmacy - No flu shot today - Use Debrox for ear wax removal in the left ear  Follow-up - Schedule six-month follow-up appointment - Obtain labs today for A1c, kidney, liver function, and thyroid function.       Return in about 6 months (around 01/30/2024) for chronic disease f/u.     Shirlee Latch, MD

## 2023-08-03 LAB — COMPREHENSIVE METABOLIC PANEL
ALT: 16 [IU]/L (ref 0–32)
AST: 21 [IU]/L (ref 0–40)
Albumin: 4.7 g/dL (ref 3.8–4.8)
Alkaline Phosphatase: 119 [IU]/L (ref 44–121)
BUN/Creatinine Ratio: 21 (ref 12–28)
BUN: 14 mg/dL (ref 8–27)
Bilirubin Total: 0.6 mg/dL (ref 0.0–1.2)
CO2: 24 mmol/L (ref 20–29)
Calcium: 10.5 mg/dL — ABNORMAL HIGH (ref 8.7–10.3)
Chloride: 100 mmol/L (ref 96–106)
Creatinine, Ser: 0.67 mg/dL (ref 0.57–1.00)
Globulin, Total: 2.5 g/dL (ref 1.5–4.5)
Glucose: 120 mg/dL — ABNORMAL HIGH (ref 70–99)
Potassium: 4.7 mmol/L (ref 3.5–5.2)
Sodium: 139 mmol/L (ref 134–144)
Total Protein: 7.2 g/dL (ref 6.0–8.5)
eGFR: 90 mL/min/{1.73_m2} (ref >59)

## 2023-08-03 LAB — TSH: TSH: 1.25 u[IU]/mL (ref 0.450–4.500)

## 2023-08-03 LAB — HEMOGLOBIN A1C
Est. average glucose Bld gHb Est-mCnc: 143 mg/dL
Hgb A1c MFr Bld: 6.6 % — ABNORMAL HIGH (ref 4.8–5.6)

## 2023-08-15 ENCOUNTER — Ambulatory Visit (INDEPENDENT_AMBULATORY_CARE_PROVIDER_SITE_OTHER): Payer: Medicare Other

## 2023-08-15 DIAGNOSIS — E538 Deficiency of other specified B group vitamins: Secondary | ICD-10-CM

## 2023-08-15 MED ORDER — CYANOCOBALAMIN 1000 MCG/ML IJ SOLN
1000.0000 ug | Freq: Once | INTRAMUSCULAR | Status: AC
  Administered 2023-08-15: 1000 ug via INTRAMUSCULAR

## 2023-08-15 NOTE — Progress Notes (Signed)
B12 given, patient tolerated

## 2023-09-12 ENCOUNTER — Ambulatory Visit (INDEPENDENT_AMBULATORY_CARE_PROVIDER_SITE_OTHER): Payer: Medicare Other

## 2023-09-12 DIAGNOSIS — E538 Deficiency of other specified B group vitamins: Secondary | ICD-10-CM | POA: Diagnosis not present

## 2023-09-12 MED ORDER — CYANOCOBALAMIN 1000 MCG/ML IJ SOLN
1000.0000 ug | Freq: Once | INTRAMUSCULAR | Status: AC
  Administered 2023-09-12: 1000 ug via INTRAMUSCULAR

## 2023-09-12 NOTE — Progress Notes (Signed)
Patient tolerated B-12 injection well.

## 2023-09-21 ENCOUNTER — Other Ambulatory Visit: Payer: Self-pay | Admitting: Family Medicine

## 2023-09-21 DIAGNOSIS — I10 Essential (primary) hypertension: Secondary | ICD-10-CM

## 2023-10-08 ENCOUNTER — Telehealth: Payer: Self-pay

## 2023-10-08 MED ORDER — B-12 COMPLIANCE INJECTION 1000 MCG/ML IJ KIT: 1000.0000 ug | PACK | INTRAMUSCULAR | 11 refills | Status: AC

## 2023-10-08 NOTE — Telephone Encounter (Signed)
 Copied from CRM [id number]. Topic: General - Inquiry >> Oct 08, 2023  8:18 AM Payton Doughty wrote: Reason for CRM: pt states she is to have her B12 called in an it is not at the pharmacy.   If anything has changed, pt states she needs to know.  Please call her

## 2023-10-10 ENCOUNTER — Ambulatory Visit: Payer: Medicare Other

## 2023-10-16 ENCOUNTER — Other Ambulatory Visit: Payer: Self-pay | Admitting: Family Medicine

## 2023-10-16 NOTE — Telephone Encounter (Unsigned)
Copied from CRM [id number]. Topic: General - Other >> Oct 16, 2023 10:06 AM Everette C wrote: Reason for CRM: Medication Refill - Most Recent Primary Care Visit:  Provider: Marjie Skiff Department: [facility] Visit Type: NURSE VISIT Date: 09/12/2023  Medication: alcohol wipes and syringes   Has the patient contacted their pharmacy? Yes (Agent: If no, request that the patient contact the pharmacy for the refill. If patient does not wish to contact the pharmacy document the reason why and proceed with request.) (Agent: If yes, when and what did the pharmacy advise?)  Is this the correct pharmacy for this prescription? Yes If no, delete pharmacy and type the correct one.  This is the patient's preferred pharmacy:  [id number] - [place], [place] - [address] AT [facility] [address] [place] [address] [place] [address] Phone: [phone] Fax: [phone]   Has the prescription been filled recently? No  Is the patient out of the medication? No  Has the patient been seen for an appointment in the last year OR does the patient have an upcoming appointment? Yes  Can we respond through MyChart? No  Agent: Please be advised that Rx refills may take up to 3 business days. We ask that you follow-up with your pharmacy.

## 2023-10-17 ENCOUNTER — Ambulatory Visit: Payer: Medicare Other

## 2023-10-18 ENCOUNTER — Telehealth: Payer: Self-pay | Admitting: Family Medicine

## 2023-10-18 MED ORDER — SYRINGE (DISPOSABLE) 1 ML MISC: 0 refills | Status: DC

## 2023-10-18 MED ORDER — ISOPROPYL ALCOHOL WIPES 70 % EX MISC: CUTANEOUS | 0 refills | Status: DC

## 2023-10-18 NOTE — Telephone Encounter (Signed)
[facility] pharmacy is requesting refill Syringe, Disposable, 1 ML MISC   Please advise

## 2023-10-22 ENCOUNTER — Telehealth: Payer: Self-pay

## 2023-10-22 ENCOUNTER — Other Ambulatory Visit: Payer: Self-pay

## 2023-10-22 MED ORDER — BD SYRINGE/NEEDLE 25G X 5/8" 3 ML MISC: 1.0000 | 0 refills | Status: DC

## 2023-10-22 NOTE — Telephone Encounter (Signed)
I have sent in syringe with needle for patient to do SQ B12 injections   Copied from CRM [id number]. Topic: General - Other >> Oct 22, 2023  9:11 AM Macon Large wrote: Reason for CRM: Pt stated that her pharmacy is waiting for the order for the syringes to determine if it should be for muscular or skin. Pt stated that she has been trying to get this taken care of for over a week. Pt has an appt on Wednesday and she stated that she is suppose to bring the syringes in for the appt but pharmacy has not received the information needed.

## 2023-10-22 NOTE — Telephone Encounter (Signed)
Syringe filled and  sent to the pharmacy

## 2023-10-23 ENCOUNTER — Other Ambulatory Visit: Payer: Self-pay | Admitting: Family Medicine

## 2023-10-24 ENCOUNTER — Ambulatory Visit (INDEPENDENT_AMBULATORY_CARE_PROVIDER_SITE_OTHER): Payer: Medicare Other

## 2023-10-24 ENCOUNTER — Telehealth: Payer: Self-pay

## 2023-10-24 DIAGNOSIS — E538 Deficiency of other specified B group vitamins: Secondary | ICD-10-CM | POA: Diagnosis not present

## 2023-10-24 MED ORDER — CYANOCOBALAMIN 1000 MCG/ML IJ SOLN
1000.0000 ug | Freq: Once | INTRAMUSCULAR | Status: AC
  Administered 2023-10-24: 1000 ug via INTRAMUSCULAR

## 2023-10-24 NOTE — Progress Notes (Signed)
Patient tolerated B-12 injection well.

## 2023-10-24 NOTE — Telephone Encounter (Signed)
Patient came in for B12 injection. Brought in her B12 and expresses concerns of not being able to self administered B12 injection. Reports feeling very stress about it. Gave patient her B12 injection and asked her to scheduled for the next one. Patient said if we are not able to give her the B12 injection if she can take supplements?

## 2023-10-25 NOTE — Telephone Encounter (Signed)
Requested Prescriptions  Refused Prescriptions Disp Refills   rosuvastatin (CRESTOR) 5 MG tablet [Pharmacy Med Name: ROSUVASTATIN 5MG  TABLETS] 30 tablet 5    Sig: TAKE 1 TABLET BY MOUTH EVERY DAY     Cardiovascular:  Antilipid - Statins 2 Failed - 10/25/2023  9:40 AM      Failed - Lipid Panel in normal range within the last 12 months    Cholesterol, Total  Date Value Ref Range Status  02/01/2023 144 100 - 199 mg/dL Final   LDL Chol Calc (NIH)  Date Value Ref Range Status  02/01/2023 72 0 - 99 mg/dL Final   HDL  Date Value Ref Range Status  02/01/2023 57 >39 mg/dL Final   Triglycerides  Date Value Ref Range Status  02/01/2023 78 0 - 149 mg/dL Final         Passed - Cr in normal range and within 360 days    Creat  Date Value Ref Range Status  07/19/2017 0.63 0.60 - 0.93 mg/dL Final    Comment:    For patients >11 years of age, the reference limit for Creatinine is approximately 13% higher for people identified as African-American. .    Creatinine, Ser  Date Value Ref Range Status  08/02/2023 0.67 0.57 - 1.00 mg/dL Final         Passed - Patient is not pregnant      Passed - Valid encounter within last 12 months    Recent Outpatient Visits           2 months ago Encounter for annual physical exam   [facility] [facility] [place], Marzella Schlein, MD   8 months ago Type 2 diabetes mellitus with other specified complication, without long-term current use of insulin [facility])   [place] [facility] [place], Marzella Schlein, MD   10 months ago B12 deficiency   [facility] [place], Marzella Schlein, MD   11 months ago Hyperlipidemia associated with type 2 diabetes mellitus [facility])   [facility] [place], Marzella Schlein, MD   1 year ago Encounter for Medicare annual wellness exam    [facility] [place], Marzella Schlein, MD       Future Appointments             In 3 months  [place], Marzella Schlein, MD [facility]

## 2023-10-25 NOTE — Telephone Encounter (Signed)
We could try oral B12 1000 mcg daily if she hasn't tried that before.  Also could continue to do her shots if we are able to and she is not able to self administer. I'd check a B12 level after 3 months of oral supplementation if she changes to make sure levels stay appropriate.

## 2023-10-26 NOTE — Telephone Encounter (Signed)
Pt advised. Verbalized understanding. And ok with try oral supplementation but concerned about if insurance will cover the cost because she just picked up 3 vials from pharmacy on Wednesday and not sure if she is allowed to return to the pharmacy. Please advise

## 2023-10-29 ENCOUNTER — Other Ambulatory Visit: Payer: Self-pay | Admitting: Family Medicine

## 2023-10-29 NOTE — Telephone Encounter (Signed)
Cannot return it to the pharmacy.  OTC PO B12 does not need insurance coverage as can buy OTC.

## 2023-10-30 ENCOUNTER — Other Ambulatory Visit: Payer: Self-pay

## 2023-10-30 ENCOUNTER — Telehealth: Payer: Self-pay | Admitting: Family Medicine

## 2023-10-30 MED ORDER — ROSUVASTATIN CALCIUM 5 MG PO TABS: 5.0000 mg | ORAL_TABLET | Freq: Every day | ORAL | 5 refills | Status: DC

## 2023-10-30 NOTE — Telephone Encounter (Signed)
 Pt advised. Verbalized understanding.

## 2023-10-30 NOTE — Telephone Encounter (Signed)
 Medication Refill -  Most Recent Primary Care Visit:  Provider: ROSAS, JOSELINE E  Department: [id number]  Visit Type: NURSE VISIT  Date: 10/24/2023  Medication: rosuvastatin  (CRESTOR ) 5 MG tablet  Pt requesting refill, pt is now out of medication.   Has the patient contacted their pharmacy? Yes  Is this the correct pharmacy for this prescription? Yes This is the patient's preferred pharmacy:  [id number] - [place], [place] - [address] AT [facility] OF [address] [place] [address] [place] [address] Phone: [phone] Fax: [phone]   Has the prescription been filled recently? Yes, last filled 09/24/23  Is the patient out of the medication? Yes  Has the patient been seen for an appointment in the last year OR does the patient have an upcoming appointment? Yes  Can we respond through MyChart? Yes  Agent: Please be advised that Rx refills may take up to 3 business days. We ask that you follow-up with your pharmacy.

## 2023-10-30 NOTE — Telephone Encounter (Signed)
 Requested Prescriptions  Pending Prescriptions Disp Refills   rosuvastatin  (CRESTOR ) 5 MG tablet [Pharmacy Med Name: ROSUVASTATIN  5MG  TABLETS] 30 tablet 5    Sig: TAKE 1 TABLET BY MOUTH EVERY DAY     Cardiovascular:  Antilipid - Statins 2 Failed - 10/30/2023  1:36 PM      Failed - Lipid Panel in normal range within the last 12 months    Cholesterol, Total  Date Value Ref Range Status  02/01/2023 144 100 - 199 mg/dL Final   LDL Chol Calc (NIH)  Date Value Ref Range Status  02/01/2023 72 0 - 99 mg/dL Final   HDL  Date Value Ref Range Status  02/01/2023 57 >39 mg/dL Final   Triglycerides  Date Value Ref Range Status  02/01/2023 78 0 - 149 mg/dL Final         Passed - Cr in normal range and within 360 days    Creat  Date Value Ref Range Status  07/19/2017 0.63 0.60 - 0.93 mg/dL Final    Comment:    For patients >70 years of age, the reference limit for Creatinine is approximately 13% higher for people identified as African-American. .    Creatinine, Ser  Date Value Ref Range Status  08/02/2023 0.67 0.57 - 1.00 mg/dL Final         Passed - Patient is not pregnant      Passed - Valid encounter within last 12 months    Recent Outpatient Visits           2 months ago Encounter for annual physical exam   [place] [place], Jon HERO, MD   9 months ago Type 2 diabetes mellitus with other specified complication, without long-term current use of insulin [facility])   [place] [facility] [place], Jon HERO, MD   10 months ago B12 deficiency   [facility] [place], Jon HERO, MD   12 months ago Hyperlipidemia associated with type 2 diabetes mellitus [facility])   [facility] [place], Jon HERO, MD   1 year ago Encounter for Medicare annual wellness exam   [facility] [place], Jon HERO, MD       Future Appointments             In 3 months  Bacigalupo, Jon HERO, MD [facility], [facility]

## 2023-10-30 NOTE — Telephone Encounter (Signed)
[facility] called and spoke to [employer], Pensions consultant regarding this refill request. Advised it was sent on 09/24/23 #30/5 refills. She says they did not receive it. Advised I will resend.

## 2023-11-21 ENCOUNTER — Ambulatory Visit (INDEPENDENT_AMBULATORY_CARE_PROVIDER_SITE_OTHER): Payer: Medicare Other

## 2023-11-21 DIAGNOSIS — E538 Deficiency of other specified B group vitamins: Secondary | ICD-10-CM

## 2023-11-21 MED ORDER — CYANOCOBALAMIN 1000 MCG/ML IJ SOLN
1000.0000 ug | Freq: Once | INTRAMUSCULAR | Status: AC
  Administered 2023-11-21: 1000 ug via INTRAMUSCULAR

## 2023-11-21 NOTE — Progress Notes (Signed)
 Patient received B12 injection. Patient had questions about self administration.  Provided education and will have patient do self administered injection on next visit.

## 2023-12-19 ENCOUNTER — Ambulatory Visit: Payer: Medicare Other

## 2023-12-19 DIAGNOSIS — E538 Deficiency of other specified B group vitamins: Secondary | ICD-10-CM

## 2023-12-19 MED ORDER — CYANOCOBALAMIN 1000 MCG/ML IJ SOLN
1000.0000 ug | Freq: Once | INTRAMUSCULAR | Status: AC
  Administered 2023-12-19: 1000 ug via INTRAMUSCULAR

## 2024-01-04 ENCOUNTER — Ambulatory Visit: Payer: Self-pay

## 2024-01-04 ENCOUNTER — Ambulatory Visit (INDEPENDENT_AMBULATORY_CARE_PROVIDER_SITE_OTHER): Admitting: Family Medicine

## 2024-01-04 VITALS — BP 107/64 | HR 93 | Temp 98.5°F | Ht 62.0 in | Wt 134.4 lb

## 2024-01-04 DIAGNOSIS — R6889 Other general symptoms and signs: Secondary | ICD-10-CM

## 2024-01-04 DIAGNOSIS — J069 Acute upper respiratory infection, unspecified: Secondary | ICD-10-CM

## 2024-01-04 LAB — POC COVID19/FLU A&B COMBO
Covid Antigen, POC: NEGATIVE
Influenza A Antigen, POC: NEGATIVE
Influenza B Antigen, POC: NEGATIVE

## 2024-01-04 NOTE — Telephone Encounter (Signed)
 Copied from CRM [id number]. Topic: Clinical - Red Word Triage >> Jan 04, 2024  7:34 AM Izetta Dakin wrote: [facility] that prompted transfer to Nurse Triage: Head congestion, Increased cough x1week  Chief Complaint: cough Symptoms: moderate to severe cough, productive at times with yellow sputum, head congestion Frequency: Monday Pertinent Negatives: Patient denies fever, SOB Disposition: [] ED /[] Urgent Care (no appt availability in office) / [x] Appointment(In office/virtual)/ []  [facility]/ [] Home Care/ [] Refused Recommended Disposition /[] [facility]/ []  Follow-up with PCP Additional Notes: pt states headache above eyes at times but none at present time.  Reason for Disposition  SEVERE coughing spells (e.g., whooping sound after coughing, vomiting after coughing)  Answer Assessment - Initial Assessment Questions 1. ONSET: "When did the cough begin?"      Monday 2. SEVERITY: "How bad is the cough today?"      Moderate to severe 3. SPUTUM: "Describe the color of your sputum" (none, dry cough; clear, white, yellow, green)     yellow 4. HEMOPTYSIS: "Are you coughing up any blood?" If so ask: "How much?" (flecks, streaks, tablespoons, etc.)     no 5. DIFFICULTY BREATHING: "Are you having difficulty breathing?" If Yes, ask: "How bad is it?" (e.g., mild, moderate, severe)    - MILD: No SOB at rest, mild SOB with walking, speaks normally in sentences, can lie down, no retractions, pulse < 100.    - MODERATE: SOB at rest, SOB with minimal exertion and prefers to sit, cannot lie down flat, speaks in phrases, mild retractions, audible wheezing, pulse 100-120.    - SEVERE: Very SOB at rest, speaks in single words, struggling to breathe, sitting hunched forward, retractions, pulse > 120      no 6. FEVER: "Do you have a fever?" If Yes, ask: "What is your temperature, how was it measured, and when did it start?"     no 7. CARDIAC HISTORY: "Do you have any history of heart disease?"  (e.g., heart attack, congestive heart failure)      no 8. LUNG HISTORY: "Do you have any history of lung disease?"  (e.g., pulmonary embolus, asthma, emphysema)     no 9. PE RISK FACTORS: "Do you have a history of blood clots?" (or: recent major surgery, recent prolonged travel, bedridden)     N/a 10. OTHER SYMPTOMS: "Do you have any other symptoms?" (e.g., runny nose, wheezing, chest pain)       Head congestion, headache around eyes and forehead at times.  11. PREGNANCY: "Is there any chance you are pregnant?" "When was your last menstrual period?"       N/a 12. TRAVEL: "Have you traveled out of the country in the last month?" (e.g., travel history, exposures)       N/a  Protocols used: Cough - Acute Productive-A-AH

## 2024-01-04 NOTE — Progress Notes (Addendum)
 Acute visit   Patient: Michaela Weber   DOB: 1946/07/20   78 y.o. Female  MRN: [id number] PCP: Erasmo Downer, MD   Chief Complaint  Patient presents with   Cough    Pt is being seen for productive cough and head congestion X Monday and reports no better but worse. Pt reports she has taken some tylenol and cough medicine from otc. Reports minor phlegm with yellowish color, no fever that patient is aware of. Only used cough lozenge this morning.    Subjective    Discussed the use of AI scribe software for clinical note transcription with the patient, who gave verbal consent to proceed.  Symptoms x5d Mostly dry cough, sometimes productive No fever, SOB, wheezing +nasal congestion and sneezing  Discussed the use of AI scribe software for clinical note transcription with the patient, who gave verbal consent to proceed.  History of Present Illness   The patient, with a history of hypertension, presents with a persistent cough and general malaise that started on Monday. She describes occasional paroxysms of coughing that are exhausting but denies shortness of breath or wheezing. She has been managing her symptoms with Tylenol and Fisherman's lozenges, which provide some relief. The cough is occasionally productive, but not consistently. She also reports intermittent headaches, sneezing, and nasal congestion. She has not had a fever. The patient has been feeling generally unwell, with a decreased appetite and fatigue. She has been trying to lose weight and has noticed a recent weight loss of about 4 pounds.       Review of Systems  Objective    BP 107/64 (BP Location: Right Arm, Patient Position: Sitting, Cuff Size: Normal)   Pulse 93   Temp 98.5 F (36.9 C) (Oral)   Ht 5\' 2"  (1.575 m)   Wt 134 lb 6.4 oz (61 kg)   SpO2 98%   BMI 24.58 kg/m   Physical Exam Vitals reviewed.  Constitutional:      General: She is not in acute distress.    Appearance: Normal  appearance. She is well-developed. She is not diaphoretic.  HENT:     Head: Normocephalic and atraumatic.     Right Ear: External ear normal.     Left Ear: External ear normal.     Nose: Congestion present.     Mouth/Throat:     Mouth: Mucous membranes are moist.     Pharynx: Oropharynx is clear. No oropharyngeal exudate or posterior oropharyngeal erythema.  Eyes:     General: No scleral icterus.    Conjunctiva/sclera: Conjunctivae normal.     Pupils: Pupils are equal, round, and reactive to light.  Cardiovascular:     Rate and Rhythm: Normal rate and regular rhythm.     Heart sounds: Normal heart sounds. No murmur heard. Pulmonary:     Effort: Pulmonary effort is normal. No respiratory distress.     Breath sounds: Normal breath sounds. No wheezing or rales.  Musculoskeletal:     Cervical back: Neck supple.     Right lower leg: No edema.     Left lower leg: No edema.  Lymphadenopathy:     Cervical: No cervical adenopathy.  Skin:    General: Skin is warm and dry.  Neurological:     Mental Status: She is alert.       Results for orders placed or performed in visit on 01/04/24  POC Covid19/Flu A&B Antigen  Result Value Ref Range   Influenza A Antigen,  POC Negative Negative   Influenza B Antigen, POC Negative Negative   Covid Antigen, POC Negative Negative    Assessment & Plan     Problem List Items Addressed This Visit   None Visit Diagnoses       Flu-like symptoms    -  Primary   Relevant Orders   POC Covid19/Flu A&B Antigen (Completed)     Viral URI with cough          Assessment and Plan    Viral Upper Respiratory Infection Symptoms include cough, occasional productive sputum, sneezing, headache, nasal congestion, fatigue, and decreased appetite. Lungs are clear, with no evidence of pneumonia. Negative COVID-19 and influenza tests confirm a non-specific viral infection, expected to resolve in 7-10 days. - Continue acetaminophen for symptom relief - Use honey  for cough management - Consider Coricidin HBP for cough, safe for hypertension - Use Flonase nasal spray for nasal congestion - Continue allergy medication for congestion - Report new onset of shortness of breath or fever  Weight Loss Intentional weight loss of approximately 4 pounds since November, current weight 134 pounds, aligning with her goals.  Follow-up Scheduled for a follow-up appointment on May 8th. - Follow up on May 8th         No orders of the defined types were placed in this encounter.    Return if symptoms worsen or fail to improve.      Shirlee Latch, MD  [facility] [phone] (phone) [phone] (fax)  [facility]

## 2024-01-07 DIAGNOSIS — E119 Type 2 diabetes mellitus without complications: Secondary | ICD-10-CM | POA: Diagnosis not present

## 2024-01-07 DIAGNOSIS — H2513 Age-related nuclear cataract, bilateral: Secondary | ICD-10-CM | POA: Diagnosis not present

## 2024-01-07 DIAGNOSIS — H40003 Preglaucoma, unspecified, bilateral: Secondary | ICD-10-CM | POA: Diagnosis not present

## 2024-01-07 LAB — HM DIABETES EYE EXAM

## 2024-01-16 ENCOUNTER — Ambulatory Visit

## 2024-01-16 DIAGNOSIS — E538 Deficiency of other specified B group vitamins: Secondary | ICD-10-CM | POA: Diagnosis not present

## 2024-01-16 MED ORDER — CYANOCOBALAMIN 1000 MCG/ML IJ SOLN
1000.0000 ug | Freq: Once | INTRAMUSCULAR | Status: AC
Start: 2024-01-16 — End: 2024-01-16
  Administered 2024-01-16: 1000 ug via INTRAMUSCULAR

## 2024-01-31 ENCOUNTER — Ambulatory Visit: Payer: Self-pay | Admitting: Family Medicine

## 2024-01-31 ENCOUNTER — Encounter: Payer: Self-pay | Admitting: Family Medicine

## 2024-01-31 VITALS — BP 120/69 | HR 99 | Ht 62.0 in | Wt 132.5 lb

## 2024-01-31 DIAGNOSIS — E785 Hyperlipidemia, unspecified: Secondary | ICD-10-CM | POA: Diagnosis not present

## 2024-01-31 DIAGNOSIS — E1159 Type 2 diabetes mellitus with other circulatory complications: Secondary | ICD-10-CM

## 2024-01-31 DIAGNOSIS — E538 Deficiency of other specified B group vitamins: Secondary | ICD-10-CM

## 2024-01-31 DIAGNOSIS — E1169 Type 2 diabetes mellitus with other specified complication: Secondary | ICD-10-CM | POA: Diagnosis not present

## 2024-01-31 DIAGNOSIS — E039 Hypothyroidism, unspecified: Secondary | ICD-10-CM | POA: Diagnosis not present

## 2024-01-31 DIAGNOSIS — E559 Vitamin D deficiency, unspecified: Secondary | ICD-10-CM

## 2024-01-31 DIAGNOSIS — I152 Hypertension secondary to endocrine disorders: Secondary | ICD-10-CM

## 2024-01-31 MED ORDER — ROSUVASTATIN CALCIUM 5 MG PO TABS: 5.0000 mg | ORAL_TABLET | Freq: Every day | ORAL | 3 refills | Status: AC

## 2024-01-31 NOTE — Assessment & Plan Note (Signed)
 Hypertension is well-controlled with the current medication regimen. Blood pressure readings are within target range. - Continue current antihypertensive medication regimen

## 2024-01-31 NOTE — Assessment & Plan Note (Signed)
 Hyperlipidemia is managed with the current medication regimen. She reports receiving only a 30-day supply of medication instead of 90 days. - Send prescription for rosuvastatin  with note to pharmacy to provide a 90-day supply

## 2024-01-31 NOTE — Progress Notes (Signed)
 Established patient visit   Patient: Michaela Weber   DOB: 1945/12/28   78 y.o. Female  MRN: [id number] Visit Date: 01/31/2024  Today's healthcare provider: Aden Agreste, MD   Chief Complaint  Patient presents with   Medical Management of Chronic Issues    Patient last seen 08/02/2023. Pt reports she is tolerating all medications well. Making dietary changes such as cutting out sweets and being aware of what she is eating and how much she is consuming   Diabetes    Pt reports she does not monitor at home and updated her eye exam a few weeks ago. She reports no symptoms.    Hyperlipidemia    NO symptoms to report   Hypothyroidism    No symptoms to report   Hypertension    No symptoms to report and does not monitor at home    Care Management    Foot exam - overdue since 11/04/23 Zoster Vaccine - declined  Diabetic Kidney Evaluation - specimen collected   Subjective    Diabetes  Hyperlipidemia  Hypertension   HPI     Medical Management of Chronic Issues    Additional comments: Patient last seen 08/02/2023. Pt reports she is tolerating all medications well. Making dietary changes such as cutting out sweets and being aware of what she is eating and how much she is consuming        Diabetes    Additional comments: Pt reports she does not monitor at home and updated her eye exam a few weeks ago. She reports no symptoms.         Hyperlipidemia    Additional comments: NO symptoms to report        Hypothyroidism    Additional comments: No symptoms to report        Hypertension    Additional comments: No symptoms to report and does not monitor at home         Care Management    Additional comments: Foot exam - overdue since 11/04/23 Zoster Vaccine - declined  Diabetic Kidney Evaluation - specimen collected      Last edited by Pasty Bongo, CMA on 01/31/2024  8:28 AM.       Discussed the use of AI scribe software for clinical note  transcription with the patient, who gave verbal consent to proceed.  History of Present Illness   Michaela Weber is a 78 year old female who presents for chronic follow-up.  She takes vitamin D , baby aspirin, Synthroid  100 mcg daily, lisinopril -HCTZ 10-12.5 mg daily, and Crestor  5 mg daily. There is an issue with receiving only a 30-day supply of Crestor  instead of the usual 90-day supply.  She has type two diabetes and is due for her annual foot exam and urine test for protein levels. A urine specimen has been provided. A recent eye exam was completed, and results were sent to the clinic.  Her hypothyroidism is stable with current thyroid  levels. She does not recall symptoms when levels were low.  She is due for vitamin D  and B12 level checks, last done in November. Her last B12 shot was in April, with another scheduled for the 21st of this month.         Medications: Outpatient Medications Prior to Visit  Medication Sig   aspirin 81 MG EC tablet Take 81 mg by mouth daily.   Cyanocobalamin  (B-12 COMPLIANCE INJECTION) 1000 MCG/ML KIT Inject 1,000 mcg as directed every 30 (thirty) days.  Once monthly injection   levothyroxine  (SYNTHROID ) 100 MCG tablet Take 1 tablet (100 mcg total) by mouth daily before breakfast.   lisinopril -hydrochlorothiazide  (ZESTORETIC ) 10-12.5 MG tablet TAKE 1 TABLET BY MOUTH DAILY   VITAMIN D , CHOLECALCIFEROL, PO Take 2,000 Units by mouth daily.   [DISCONTINUED] Isopropyl Alcohol  Wipes 70 % MISC Use as directed (Patient not taking: Reported on 01/31/2024)   [DISCONTINUED] rosuvastatin  (CRESTOR ) 5 MG tablet Take 1 tablet (5 mg total) by mouth daily.   [DISCONTINUED] Syringe, Disposable, 1 ML MISC Use as directed (Patient not taking: Reported on 01/31/2024)   [DISCONTINUED] SYRINGE-NEEDLE, DISP, 3 ML (B-D SYRINGE/NEEDLE 3CC/25GX5/8) 25G X 5/8" 3 ML MISC 1 each by Does not apply route every 30 (thirty) days. (Patient not taking: Reported on 01/31/2024)   No  facility-administered medications prior to visit.    Review of Systems     Objective    BP 120/69 (BP Location: Left Arm, Patient Position: Sitting, Cuff Size: Normal)   Pulse 99   Ht 5\' 2"  (1.575 m)   Wt 132 lb 8 oz (60.1 kg)   SpO2 92%   BMI 24.23 kg/m    Physical Exam Vitals reviewed.  Constitutional:      General: She is not in acute distress.    Appearance: Normal appearance. She is well-developed. She is not diaphoretic.  HENT:     Head: Normocephalic and atraumatic.  Eyes:     General: No scleral icterus.    Conjunctiva/sclera: Conjunctivae normal.  Neck:     Thyroid : No thyromegaly.  Cardiovascular:     Rate and Rhythm: Normal rate and regular rhythm.     Heart sounds: Normal heart sounds. No murmur heard. Pulmonary:     Effort: Pulmonary effort is normal. No respiratory distress.     Breath sounds: Normal breath sounds. No wheezing, rhonchi or rales.  Musculoskeletal:     Cervical back: Neck supple.     Right lower leg: No edema.     Left lower leg: No edema.  Lymphadenopathy:     Cervical: No cervical adenopathy.  Skin:    General: Skin is warm and dry.     Findings: No rash.  Neurological:     Mental Status: She is alert and oriented to person, place, and time. Mental status is at baseline.  Psychiatric:        Mood and Affect: Mood normal.        Behavior: Behavior normal.      No results found for any visits on 01/31/24.  Assessment & Plan     Problem List Items Addressed This Visit       Cardiovascular and Mediastinum   Hypertension associated with diabetes (HCC)   Hypertension is well-controlled with the current medication regimen. Blood pressure readings are within target range. - Continue current antihypertensive medication regimen      Relevant Medications   rosuvastatin  (CRESTOR ) 5 MG tablet     Endocrine   Hyperlipidemia associated with type 2 diabetes mellitus (HCC) - Primary   Hyperlipidemia is managed with the current  medication regimen. She reports receiving only a 30-day supply of medication instead of 90 days. - Send prescription for rosuvastatin  with note to pharmacy to provide a 90-day supply      Relevant Medications   rosuvastatin  (CRESTOR ) 5 MG tablet   Other Relevant Orders   Comprehensive metabolic panel with GFR   Lipid Panel With LDL/HDL Ratio   Adult hypothyroidism   Hypothyroidism is well-managed with the current  Synthroid  dosage. Previous thyroid  levels were stable, and she reports no symptoms of hypothyroidism. - Continue current Synthroid  dosage      Relevant Orders   TSH   T2DM (type 2 diabetes mellitus) (HCC)   Type 2 diabetes is well-managed with the current treatment plan. Annual foot exam shows no abnormalities. Due for annual urine test to assess protein levels. - Perform annual urine test for protein levels - Continue current diabetes management plan      Relevant Medications   rosuvastatin  (CRESTOR ) 5 MG tablet   Other Relevant Orders   Hemoglobin A1c   Urine microalbumin-creatinine with uACR     Other   Vitamin D  deficiency   Relevant Orders   VITAMIN D  25 Hydroxy (Vit-D Deficiency, Fractures)   B12 deficiency   Relevant Orders   B12        Wellness Visit Routine wellness visit for chronic condition follow-up. No acute issues. Blood pressure is well-controlled. Annual diabetes foot exam shows no abnormalities. She is compliant with medications and reports no issues. - Order annual labs including cholesterol, kidney and liver function, A1c, thyroid  levels, and vitamin D  - Schedule six-month follow-up for physical exam  Vitamin D  deficiency Vitamin D  deficiency is managed with supplementation. Vitamin D  levels were not checked in November; plan to check levels today. - Order vitamin D  level as part of annual labs        Return in about 6 months (around 08/02/2024) for CPE.       Aden Agreste, MD  [facility] [phone] (phone) [phone] (fax)  [facility]

## 2024-01-31 NOTE — Assessment & Plan Note (Signed)
 Type 2 diabetes is well-managed with the current treatment plan. Annual foot exam shows no abnormalities. Due for annual urine test to assess protein levels. - Perform annual urine test for protein levels - Continue current diabetes management plan

## 2024-01-31 NOTE — Assessment & Plan Note (Signed)
 Hypothyroidism is well-managed with the current Synthroid  dosage. Previous thyroid  levels were stable, and she reports no symptoms of hypothyroidism. - Continue current Synthroid  dosage

## 2024-02-01 ENCOUNTER — Encounter: Payer: Self-pay | Admitting: Family Medicine

## 2024-02-03 LAB — COMPREHENSIVE METABOLIC PANEL WITH GFR
ALT: 12 IU/L (ref 0–32)
AST: 13 IU/L (ref 0–40)
Albumin: 4.7 g/dL (ref 3.8–4.8)
Alkaline Phosphatase: 115 IU/L (ref 44–121)
BUN/Creatinine Ratio: 31 — ABNORMAL HIGH (ref 12–28)
BUN: 23 mg/dL (ref 8–27)
Bilirubin Total: 0.4 mg/dL (ref 0.0–1.2)
CO2: 26 mmol/L (ref 20–29)
Calcium: 10.4 mg/dL — ABNORMAL HIGH (ref 8.7–10.3)
Chloride: 102 mmol/L (ref 96–106)
Creatinine, Ser: 0.75 mg/dL (ref 0.57–1.00)
Globulin, Total: 2.6 g/dL (ref 1.5–4.5)
Glucose: 128 mg/dL — ABNORMAL HIGH (ref 70–99)
Potassium: 4.8 mmol/L (ref 3.5–5.2)
Sodium: 141 mmol/L (ref 134–144)
Total Protein: 7.3 g/dL (ref 6.0–8.5)
eGFR: 82 mL/min/{1.73_m2} (ref >59)

## 2024-02-03 LAB — MICROALBUMIN / CREATININE URINE RATIO
Creatinine, Urine: 188.6 mg/dL
Microalb/Creat Ratio: 5 mg/g{creat} (ref 0–29)
Microalbumin, Urine: 9.1 ug/mL

## 2024-02-03 LAB — TSH: TSH: 0.722 u[IU]/mL (ref 0.450–4.500)

## 2024-02-03 LAB — LIPID PANEL WITH LDL/HDL RATIO
Cholesterol, Total: 162 mg/dL (ref 100–199)
HDL: 58 mg/dL (ref >39)
LDL Chol Calc (NIH): 84 mg/dL (ref 0–99)
LDL/HDL Ratio: 1.4 ratio (ref 0.0–3.2)
Triglycerides: 113 mg/dL (ref 0–149)
VLDL Cholesterol Cal: 20 mg/dL (ref 5–40)

## 2024-02-03 LAB — VITAMIN B12: Vitamin B-12: 627 pg/mL (ref 232–1245)

## 2024-02-03 LAB — HEMOGLOBIN A1C
Est. average glucose Bld gHb Est-mCnc: 140 mg/dL
Hgb A1c MFr Bld: 6.5 % — ABNORMAL HIGH (ref 4.8–5.6)

## 2024-02-03 LAB — VITAMIN D 25 HYDROXY (VIT D DEFICIENCY, FRACTURES): Vit D, 25-Hydroxy: 46.1 ng/mL (ref 30.0–100.0)

## 2024-02-13 ENCOUNTER — Ambulatory Visit (INDEPENDENT_AMBULATORY_CARE_PROVIDER_SITE_OTHER)

## 2024-02-13 ENCOUNTER — Ambulatory Visit

## 2024-02-13 DIAGNOSIS — E538 Deficiency of other specified B group vitamins: Secondary | ICD-10-CM | POA: Diagnosis not present

## 2024-02-13 MED ORDER — CYANOCOBALAMIN 1000 MCG/ML IJ SOLN
1000.0000 ug | Freq: Once | INTRAMUSCULAR | Status: AC
  Administered 2024-02-13: 1000 ug via INTRAMUSCULAR

## 2024-02-13 NOTE — Progress Notes (Signed)
Patient presented for B12 injection.

## 2024-03-12 ENCOUNTER — Ambulatory Visit (INDEPENDENT_AMBULATORY_CARE_PROVIDER_SITE_OTHER)

## 2024-03-12 DIAGNOSIS — E538 Deficiency of other specified B group vitamins: Secondary | ICD-10-CM | POA: Diagnosis not present

## 2024-03-12 MED ORDER — CYANOCOBALAMIN 1000 MCG/ML IJ SOLN
1000.0000 ug | Freq: Once | INTRAMUSCULAR | Status: AC
  Administered 2024-03-12: 1000 ug via INTRAMUSCULAR

## 2024-03-12 NOTE — Progress Notes (Signed)
Patient came in for monthly B12 injection.

## 2024-03-22 ENCOUNTER — Other Ambulatory Visit: Payer: Self-pay | Admitting: Family Medicine

## 2024-03-22 DIAGNOSIS — I10 Essential (primary) hypertension: Secondary | ICD-10-CM

## 2024-03-24 NOTE — Telephone Encounter (Signed)
 Requested Prescriptions  Pending Prescriptions Disp Refills   lisinopril -hydrochlorothiazide  (ZESTORETIC ) 10-12.5 MG tablet [Pharmacy Med Name: LISINOPRIL -HCTZ 10/12.5MG  TABLETS] 90 tablet 1    Sig: TAKE 1 TABLET BY MOUTH DAILY     Cardiovascular:  ACEI + Diuretic Combos Passed - 03/24/2024  3:30 PM      Passed - Na in normal range and within 180 days    Sodium  Date Value Ref Range Status  01/31/2024 141 134 - 144 mmol/L Final         Passed - K in normal range and within 180 days    Potassium  Date Value Ref Range Status  01/31/2024 4.8 3.5 - 5.2 mmol/L Final         Passed - Cr in normal range and within 180 days    Creat  Date Value Ref Range Status  07/19/2017 0.63 0.60 - 0.93 mg/dL Final    Comment:    For patients >75 years of age, the reference limit for Creatinine is approximately 13% higher for people identified as African-American. .    Creatinine, Ser  Date Value Ref Range Status  01/31/2024 0.75 0.57 - 1.00 mg/dL Final         Passed - eGFR is 30 or above and within 180 days    GFR, Est African American  Date Value Ref Range Status  07/19/2017 105 > OR = 60 mL/min/1.70m2 Final   GFR calc Af Amer  Date Value Ref Range Status  07/20/2020 102 >59 mL/min/1.73 Final    Comment:    **In accordance with recommendations from the NKF-ASN Task force,**   [facility] is in the process of updating its eGFR calculation to the   2021 CKD-EPI creatinine equation that estimates kidney function   without a race variable.    GFR, Est Non African American  Date Value Ref Range Status  07/19/2017 90 > OR = 60 mL/min/1.65m2 Final   GFR calc non Af Amer  Date Value Ref Range Status  07/20/2020 89 >59 mL/min/1.73 Final   eGFR  Date Value Ref Range Status  01/31/2024 82 >59 mL/min/1.73 Final         Passed - Patient is not pregnant      Passed - Last BP in normal range    BP Readings from Last 1 Encounters:  01/31/24 120/69         Passed - Valid encounter within  last 6 months    Recent Outpatient Visits           1 month ago Hyperlipidemia associated with type 2 diabetes mellitus [facility])   [place] [facility] [place], Jon HERO, MD   2 months ago Flu-like symptoms   [facility] [place], Jon HERO, MD       Future Appointments             In 4 months Bacigalupo, Jon HERO, MD [facility]

## 2024-03-31 ENCOUNTER — Telehealth: Payer: Self-pay

## 2024-03-31 DIAGNOSIS — E039 Hypothyroidism, unspecified: Secondary | ICD-10-CM

## 2024-03-31 MED ORDER — LEVOTHYROXINE SODIUM 100 MCG PO TABS: 100.0000 ug | ORAL_TABLET | Freq: Every day | ORAL | 1 refills | Status: DC

## 2024-03-31 NOTE — Telephone Encounter (Signed)
 Copied from CRM [id number]. Topic: Clinical - Prescription Issue >> Mar 31, 2024 10:50 AM Treva T wrote: Reason for CRM: Patient calling to check status of medication refill request, for levothyroxine  (SYNTHROID ) 100 MCG tablet.   Per patient states that was requested on last week pharmacy had requested.   Patient is now out of the medication, needs refilled as soon as possible.   Preferred pharmacy:   [id number] [place],  - [address] AT [facility] [address] [place] [address] [place] [address] Phone: [phone] Fax: [phone]  Patient can be reached at [phone]  with any questions.

## 2024-04-07 ENCOUNTER — Other Ambulatory Visit: Payer: Self-pay | Admitting: Family Medicine

## 2024-04-07 DIAGNOSIS — Z1231 Encounter for screening mammogram for malignant neoplasm of breast: Secondary | ICD-10-CM

## 2024-04-09 ENCOUNTER — Ambulatory Visit

## 2024-04-16 ENCOUNTER — Ambulatory Visit

## 2024-04-16 DIAGNOSIS — E538 Deficiency of other specified B group vitamins: Secondary | ICD-10-CM | POA: Diagnosis not present

## 2024-04-16 MED ORDER — CYANOCOBALAMIN 1000 MCG/ML IJ SOLN
1000.0000 ug | Freq: Once | INTRAMUSCULAR | Status: AC
  Administered 2024-04-16: 1000 ug via INTRAMUSCULAR

## 2024-04-16 NOTE — Progress Notes (Signed)
 B12 injection administered using 25G 1. Patient tolerated well. Advised to return in 4 weeks

## 2024-05-14 ENCOUNTER — Ambulatory Visit (INDEPENDENT_AMBULATORY_CARE_PROVIDER_SITE_OTHER)

## 2024-05-14 DIAGNOSIS — E538 Deficiency of other specified B group vitamins: Secondary | ICD-10-CM

## 2024-05-14 MED ORDER — CYANOCOBALAMIN 1000 MCG/ML IJ SOLN
1000.0000 ug | Freq: Once | INTRAMUSCULAR | Status: AC
Start: 2024-05-14 — End: 2024-05-14
  Administered 2024-05-14: 1000 ug via INTRAMUSCULAR

## 2024-05-22 ENCOUNTER — Encounter

## 2024-06-11 ENCOUNTER — Ambulatory Visit (INDEPENDENT_AMBULATORY_CARE_PROVIDER_SITE_OTHER)

## 2024-06-11 DIAGNOSIS — E538 Deficiency of other specified B group vitamins: Secondary | ICD-10-CM | POA: Diagnosis not present

## 2024-06-11 MED ORDER — CYANOCOBALAMIN 1000 MCG/ML IJ SOLN
1000.0000 ug | Freq: Once | INTRAMUSCULAR | Status: AC
  Administered 2024-06-11: 1000 ug via INTRAMUSCULAR

## 2024-06-13 ENCOUNTER — Ambulatory Visit
Admission: RE | Admit: 2024-06-13 | Discharge: 2024-06-13 | Disposition: A | Discharge status: Home / Self Care | Source: Ambulatory Visit | Attending: Family Medicine | Admitting: Family Medicine

## 2024-06-13 DIAGNOSIS — Z1231 Encounter for screening mammogram for malignant neoplasm of breast: Secondary | ICD-10-CM | POA: Diagnosis not present

## 2024-06-17 ENCOUNTER — Ambulatory Visit: Payer: Self-pay | Admitting: Family Medicine

## 2024-07-09 ENCOUNTER — Ambulatory Visit (INDEPENDENT_AMBULATORY_CARE_PROVIDER_SITE_OTHER)

## 2024-07-09 DIAGNOSIS — E538 Deficiency of other specified B group vitamins: Secondary | ICD-10-CM | POA: Diagnosis not present

## 2024-07-09 MED ORDER — CYANOCOBALAMIN 1000 MCG/ML IJ SOLN
1000.0000 ug | Freq: Once | INTRAMUSCULAR | Status: AC
  Administered 2024-07-09: 1000 ug via INTRAMUSCULAR

## 2024-07-15 ENCOUNTER — Ambulatory Visit: Payer: Medicare Other

## 2024-07-15 DIAGNOSIS — Z0000 Encounter for general adult medical examination without abnormal findings: Secondary | ICD-10-CM

## 2024-07-15 NOTE — Patient Instructions (Addendum)
 Ms. Mortellaro,  Thank you for taking the time for your Medicare Wellness Visit. I appreciate your continued commitment to your health goals. Please review the care plan we discussed, and feel free to reach out if I can assist you further.  Medicare recommends these wellness visits once per year to help you and your care team stay ahead of potential health issues. These visits are designed to focus on prevention, allowing your provider to concentrate on managing your acute and chronic conditions during your regular appointments.  Please note that Annual Wellness Visits do not include a physical exam. Some assessments may be limited, especially if the visit was conducted virtually. If needed, we may recommend a separate in-person follow-up with your provider.  Ongoing Care Seeing your primary care provider every 3 to 6 months helps us  monitor your health and provide consistent, personalized care.   Referrals If a referral was made during today's visit and you haven't received any updates within two weeks, please contact the referred provider directly to check on the status.  Recommended Screenings:  Health Maintenance  Topic Date Due   DTaP/Tdap/Td vaccine (1 - Tdap) Never done   Zoster (Shingles) Vaccine (1 of 2) Never done   DEXA scan (bone density measurement)  02/23/2020   Flu Shot  04/25/2024   COVID-19 Vaccine (1 - 2025-26 season) Never done   Hemoglobin A1C  08/02/2024   Eye exam for diabetics  01/06/2025   Yearly kidney function blood test for diabetes  01/30/2025   Yearly kidney health urinalysis for diabetes  01/30/2025   Complete foot exam   01/30/2025   Breast Cancer Screening  06/13/2025   Medicare Annual Wellness Visit  07/15/2025   Pneumococcal Vaccine for age over 30  Completed   Hepatitis C Screening  Completed   Meningitis B Vaccine  Aged Out   Colon Cancer Screening  Discontinued     Advance Care Planning is important because it: Ensures you receive medical care  that aligns with your values, goals, and preferences. Provides guidance to your family and loved ones, reducing the emotional burden of decision-making during critical moments.  Vision: Annual vision screenings are recommended for early detection of glaucoma, cataracts, and diabetic retinopathy. These exams can also reveal signs of chronic conditions such as diabetes and high blood pressure.  Dental: Annual dental screenings help detect early signs of oral cancer, gum disease, and other conditions linked to overall health, including heart disease and diabetes.  Please see the attached documents for additional preventive care recommendations.   NEXT AWV 07/21/25 @ 8:50 AM BY PHONE

## 2024-07-15 NOTE — Progress Notes (Signed)
 Subjective:   Michaela Weber is a 78 y.o. who presents for a Medicare Wellness preventive visit.  As a reminder, Annual Wellness Visits don't include a physical exam, and some assessments may be limited, especially if this visit is performed virtually. We may recommend an in-person follow-up visit with your provider if needed.  Visit Complete: Virtual I connected with  Montel Cleotilde Agent on 07/15/24 by a audio enabled telemedicine application and verified that I am speaking with the correct person using two identifiers.  Patient Location: Home  Provider Location: Office/Clinic  I discussed the limitations of evaluation and management by telemedicine. The patient expressed understanding and agreed to proceed.  Vital Signs: Because this visit was a virtual/telehealth visit, some criteria may be missing or patient reported. Any vitals not documented were not able to be obtained and vitals that have been documented are patient reported.  VideoDeclined- This patient declined Librarian, academic. Therefore the visit was completed with audio only.  Persons Participating in Visit: Patient.  AWV Questionnaire: No: Patient Medicare AWV questionnaire was not completed prior to this visit.  Cardiac Risk Factors include: advanced age (>16men, >42 women);diabetes mellitus;dyslipidemia;hypertension     Objective:    There were no vitals filed for this visit. There is no height or weight on file to calculate BMI.     07/15/2024    8:57 AM 07/10/2023    9:23 AM 10/07/2021    8:18 AM 07/20/2020   10:00 AM 02/04/2019    9:16 AM 01/15/2018   10:06 AM 07/27/2017   11:32 AM  Advanced Directives  Does Patient Have a Medical Advance Directive? No No No No No No  No   Would patient like information on creating a medical advance directive? No - Patient declined  No - Patient declined Yes (ED - Information included in AVS) No - Patient declined  Yes  (MAU/Ambulatory/Procedural Areas - Information given)       Data saved with a previous flowsheet row definition    Current Medications (verified) Outpatient Encounter Medications as of 07/15/2024  Medication Sig   aspirin 81 MG EC tablet Take 81 mg by mouth daily.   Cyanocobalamin  (B-12 COMPLIANCE INJECTION) 1000 MCG/ML KIT Inject 1,000 mcg as directed every 30 (thirty) days. Once monthly injection   levothyroxine  (SYNTHROID ) 100 MCG tablet Take 1 tablet (100 mcg total) by mouth daily before breakfast.   lisinopril -hydrochlorothiazide  (ZESTORETIC ) 10-12.5 MG tablet TAKE 1 TABLET BY MOUTH DAILY   rosuvastatin  (CRESTOR ) 5 MG tablet Take 1 tablet (5 mg total) by mouth daily.   VITAMIN D , CHOLECALCIFEROL, PO Take 2,000 Units by mouth daily.   No facility-administered encounter medications on file as of 07/15/2024.    Allergies (verified) Codeine   History: Past Medical History:  Diagnosis Date   Allergy    Codeine   Anemia    in past   Diabetes mellitus without complication (HCC) 2024   Diastolic dysfunction    Hyperlipidemia    Hypertension    Hypothyroidism    Valvular disease    mild   Wears contact lenses    Past Surgical History:  Procedure Laterality Date   CESAREAN SECTION     x2    COLONOSCOPY  approx 2007   COLONOSCOPY N/A 10/07/2021   Procedure: COLONOSCOPY;  Surgeon: Jinny Carmine, MD;  Location: [facility];  Service: Endoscopy;  Laterality: N/A;   COLONOSCOPY WITH PROPOFOL  N/A 04/07/2016   Procedure: COLONOSCOPY WITH PROPOFOL ;  Surgeon: Carmine Jinny,  MD;  Location: [facility];  Service: Endoscopy;  Laterality: N/A;   GANGLION CYST EXCISION     POLYPECTOMY  04/07/2016   Procedure: POLYPECTOMY;  Surgeon: Rogelia Copping, MD;  Location: [facility];  Service: Endoscopy;;   TONSILLECTOMY     TUBAL LIGATION     with 2nd c-sect   Family History  Problem Relation Age of Onset   Diabetes Father    Alzheimer's disease Mother    Healthy  Brother    Breast cancer Neg Hx    Colon cancer Neg Hx    Ovarian cancer Neg Hx    Social History   Socioeconomic History   Marital status: Widowed    Spouse name: Kalayah Leske   Number of children: 2   Years of education: 14   Highest education level: Associate degree: occupational, Scientist, product/process development, or vocational program  Occupational History    Employer: RETIRED  Tobacco Use   Smoking status: Never   Smokeless tobacco: Never   Tobacco comments:    tobacco use - no  Vaping Use   Vaping status: Never Used  Substance and Sexual Activity   Alcohol  use: No   Drug use: No   Sexual activity: Not Currently  Other Topics Concern   Not on file  Social History Narrative   Not on file   Social Drivers of Health   Financial Resource Strain: Low Risk  (07/15/2024)   Overall Financial Resource Strain (CARDIA)    Difficulty of Paying Living Expenses: Not hard at all  Food Insecurity: No Food Insecurity (07/15/2024)   Hunger Vital Sign    Worried About Running Out of Food in the Last Year: Never true    Ran Out of Food in the Last Year: Never true  Transportation Needs: No Transportation Needs (07/15/2024)   PRAPARE - Administrator, Civil Service (Medical): No    Lack of Transportation (Non-Medical): No  Physical Activity: Insufficiently Active (07/15/2024)   Exercise Vital Sign    Days of Exercise per Week: 3 days    Minutes of Exercise per Session: 20 min  Stress: No Stress Concern Present (07/15/2024)   [employer] of Occupational Health - Occupational Stress Questionnaire    Feeling of Stress: Not at all  Social Connections: Moderately Integrated (07/15/2024)   Social Connection and Isolation Panel    Frequency of Communication with Friends and Family: More than three times a week    Frequency of Social Gatherings with Friends and Family: More than three times a week    Attends Religious Services: More than 4 times per year    Active Member of [employer] or  Organizations: No    Attends Engineer, structural: More than 4 times per year    Marital Status: Widowed  Recent Concern: Social Connections - Moderately Isolated (07/11/2024)   Social Connection and Isolation Panel    Frequency of Communication with Friends and Family: Not on file    Frequency of Social Gatherings with Friends and Family: More than three times a week    Attends Religious Services: More than 4 times per year    Active Member of [employer] or Organizations: No    Attends Banker Meetings: Not on file    Marital Status: Widowed    Tobacco Counseling Counseling given: Not Answered Tobacco comments: tobacco use - no    Clinical Intake:  Pre-visit preparation completed: Yes  Pain : No/denies pain     BMI - recorded:  24.1 Nutritional Status: BMI of 19-24  Normal Nutritional Risks: None Diabetes: Yes CBG done?: No Did pt. bring in CBG monitor from home?: No  Lab Results  Component Value Date   HGBA1C 6.5 (H) 01/31/2024   HGBA1C 6.6 (H) 08/02/2023   HGBA1C 6.6 (H) 02/01/2023     How often do you need to have someone help you when you read instructions, pamphlets, or other written materials from your doctor or pharmacy?: 1 - Never  Interpreter Needed?: No  Information entered by :: JHONNIE DAS, LPN   Activities of Daily Living     07/15/2024    8:59 AM 07/11/2024    9:51 AM  In your present state of health, do you have any difficulty performing the following activities:  Hearing? 0 0  Vision? 0 0  Difficulty concentrating or making decisions? 0 0  Walking or climbing stairs? 0 0  Dressing or bathing? 0 0  Doing errands, shopping? 0 0  Preparing Food and eating ? N N  Using the Toilet? N N  In the past six months, have you accidently leaked urine? N N  Do you have problems with loss of bowel control? N N  Managing your Medications? N N  Managing your Finances? N N  Housekeeping or managing your Housekeeping? N N    Patient  Care Team: Myrla Jon HERO, MD as PCP - General (Family Medicine) Enola Feliciano Hugger, MD as Consulting Physician (Ophthalmology)  I have updated your Care Teams any recent Medical Services you may have received from other providers in the past year.     Assessment:   This is a routine wellness examination for Lorilee.  Hearing/Vision screen Hearing Screening - Comments:: NO AIDS Vision Screening - Comments:: ONE CONTACT SHE WEARS WHEN DRIVING- [facility]- GOES ONCE PER YEAR   Goals Addressed             This Visit's Progress    DIET - EAT MORE FRUITS AND VEGETABLES         Depression Screen     07/15/2024    8:56 AM 01/31/2024    8:23 AM 08/02/2023    8:38 AM 07/10/2023    9:19 AM 11/03/2022    8:16 AM 07/31/2022    9:19 AM 07/26/2021    9:18 AM  PHQ 2/9 Scores  PHQ - 2 Score 0 0  0 0 0 0  PHQ- 9 Score 0    0 0 0  Exception Documentation   Patient refusal        Fall Risk     07/11/2024    9:51 AM 01/31/2024    8:23 AM 07/07/2023    9:56 AM 11/03/2022    8:15 AM 07/31/2022    9:19 AM  Fall Risk   Falls in the past year? 0 0 0 0 0  Number falls in past yr: 0 0 0 0 0  Injury with Fall? 0 0 0 0 0  Risk for fall due to : No Fall Risks No Fall Risks No Fall Risks No Fall Risks No Fall Risks  Follow up Falls evaluation completed;Falls prevention discussed Falls evaluation completed Falls prevention discussed;Education provided Falls evaluation completed Falls evaluation completed      Data saved with a previous flowsheet row definition    MEDICARE RISK AT HOME:  Medicare Risk at Home Any stairs in or around the home?: Yes If so, are there any without handrails?: Yes Home free of loose throw rugs in  walkways, pet beds, electrical cords, etc?: Yes Adequate lighting in your home to reduce risk of falls?: Yes Life alert?: No Use of a cane, walker or w/c?: No Grab bars in the bathroom?: Yes Shower chair or bench in shower?: No (HAS ONE BUT DOESN'T USE  IT) Elevated toilet seat or a handicapped toilet?: Yes  TIMED UP AND GO:  Was the test performed?  No  Cognitive Function: 6CIT completed        07/15/2024    9:03 AM 07/10/2023    9:23 AM 07/26/2021    9:20 AM 05/16/2017   10:17 AM  6CIT Screen  What Year?  0 points 0 points 0 points  What month? 0 points 0 points 0 points 0 points  What time? 0 points 0 points 0 points 0 points  Count back from 20 0 points 0 points 0 points 0 points  Months in reverse 0 points 0 points 0 points 0 points  Repeat phrase 0 points 0 points 0 points 0 points  Total Score  0 points 0 points 0 points    Immunizations Immunization History  Administered Date(s) Administered   Fluad Quad(high Dose 65+) 06/16/2019   INFLUENZA, HIGH DOSE SEASONAL PF 07/18/2016, 07/19/2018   Influenza-Unspecified 06/22/2014   Pneumococcal Conjugate-13 06/22/2014   Pneumococcal Polysaccharide-23 11/24/2011    Screening Tests Health Maintenance  Topic Date Due   DTaP/Tdap/Td (1 - Tdap) Never done   Zoster Vaccines- Shingrix (1 of 2) Never done   DEXA SCAN  02/23/2020   Influenza Vaccine  04/25/2024   COVID-19 Vaccine (1 - 2025-26 season) Never done   HEMOGLOBIN A1C  08/02/2024   OPHTHALMOLOGY EXAM  01/06/2025   Diabetic kidney evaluation - eGFR measurement  01/30/2025   Diabetic kidney evaluation - Urine ACR  01/30/2025   FOOT EXAM  01/30/2025   Mammogram  06/13/2025   Medicare Annual Wellness (AWV)  07/15/2025   Pneumococcal Vaccine: 50+ Years  Completed   Hepatitis C Screening  Completed   Meningococcal B Vaccine  Aged Out   Colonoscopy  Discontinued    Health Maintenance Items Addressed: WANTS NO COVIDS; NEEDS TDAP, SHINGRIX; MAMMOGRAM UP TO DATE, FOOT EXAM UTD; BDS REFERRAL DECLINED  Additional Screening:  Vision Screening: Recommended annual ophthalmology exams for early detection of glaucoma and other disorders of the eye. Is the patient up to date with their annual eye exam?  Yes  Who is the  provider or what is the name of the office in which the patient attends annual eye exams? [facility]  Dental Screening: Recommended annual dental exams for proper oral hygiene  Community Resource Referral / Chronic Care Management: CRR required this visit?  No   CCM required this visit?  No   Plan:    I have personally reviewed and noted the following in the patient's chart:   Medical and social history Use of alcohol , tobacco or illicit drugs  Current medications and supplements including opioid prescriptions. Patient is not currently taking opioid prescriptions. Functional ability and status Nutritional status Physical activity Advanced directives List of other physicians Hospitalizations, surgeries, and ER visits in previous 12 months Vitals Screenings to include cognitive, depression, and falls Referrals and appointments  In addition, I have reviewed and discussed with patient certain preventive protocols, quality metrics, and best practice recommendations. A written personalized care plan for preventive services as well as general preventive health recommendations were provided to patient.   Jhonnie GORMAN Das, LPN   [id number]/7974   After Visit  Summary: (MyChart) Due to this being a telephonic visit, the after visit summary with patients personalized plan was offered to patient via MyChart   Notes: Nothing significant to report at this time.

## 2024-07-28 ENCOUNTER — Telehealth: Payer: Self-pay

## 2024-07-28 DIAGNOSIS — E039 Hypothyroidism, unspecified: Secondary | ICD-10-CM

## 2024-07-28 DIAGNOSIS — E1169 Type 2 diabetes mellitus with other specified complication: Secondary | ICD-10-CM

## 2024-07-28 DIAGNOSIS — E1159 Type 2 diabetes mellitus with other circulatory complications: Secondary | ICD-10-CM

## 2024-07-28 DIAGNOSIS — I10 Essential (primary) hypertension: Secondary | ICD-10-CM

## 2024-07-28 NOTE — Telephone Encounter (Signed)
Orders placed  Pt advised

## 2024-07-28 NOTE — Telephone Encounter (Signed)
 Copied from CRM [id number]. Topic: Clinical - Request for Lab/Test Order >> Jul 28, 2024  9:17 AM Donna BRAVO wrote: Reason for CRM: patient has appt 07/29/24  and would like labs drawn before appt  patient states she usually is NPO for labs and would to have blood drawn first thing in the morning and come back for appt   Patient does have MyChart and will check for lab orders through out the day.

## 2024-07-28 NOTE — Telephone Encounter (Signed)
 Ok to order A1c, lipid, CMP, TSH - use diagnoses T2DM, HLD, HTN, hypothyroidism from her problem list.

## 2024-07-29 ENCOUNTER — Encounter: Admitting: Family Medicine

## 2024-07-29 ENCOUNTER — Encounter: Payer: Self-pay | Admitting: Family Medicine

## 2024-07-30 ENCOUNTER — Ambulatory Visit: Payer: Self-pay | Admitting: Family Medicine

## 2024-07-30 LAB — LIPID PANEL WITH LDL/HDL RATIO
Cholesterol, Total: 171 mg/dL (ref 100–199)
HDL: 65 mg/dL (ref >39)
LDL Chol Calc (NIH): 87 mg/dL (ref 0–99)
LDL/HDL Ratio: 1.3 ratio (ref 0.0–3.2)
Triglycerides: 105 mg/dL (ref 0–149)
VLDL Cholesterol Cal: 19 mg/dL (ref 5–40)

## 2024-07-30 LAB — HEMOGLOBIN A1C
Est. average glucose Bld gHb Est-mCnc: 134 mg/dL
Hgb A1c MFr Bld: 6.3 % — ABNORMAL HIGH (ref 4.8–5.6)

## 2024-07-30 LAB — COMPREHENSIVE METABOLIC PANEL WITH GFR
ALT: 11 IU/L (ref 0–32)
AST: 16 IU/L (ref 0–40)
Albumin: 4.8 g/dL (ref 3.8–4.8)
Alkaline Phosphatase: 110 IU/L (ref 49–135)
BUN/Creatinine Ratio: 20 (ref 12–28)
BUN: 14 mg/dL (ref 8–27)
Bilirubin Total: 0.5 mg/dL (ref 0.0–1.2)
CO2: 26 mmol/L (ref 20–29)
Calcium: 10.1 mg/dL (ref 8.7–10.3)
Chloride: 100 mmol/L (ref 96–106)
Creatinine, Ser: 0.71 mg/dL (ref 0.57–1.00)
Globulin, Total: 2.6 g/dL (ref 1.5–4.5)
Glucose: 118 mg/dL — ABNORMAL HIGH (ref 70–99)
Potassium: 4.4 mmol/L (ref 3.5–5.2)
Sodium: 142 mmol/L (ref 134–144)
Total Protein: 7.4 g/dL (ref 6.0–8.5)
eGFR: 87 mL/min/1.73 (ref >59)

## 2024-07-30 LAB — TSH: TSH: 3.43 u[IU]/mL (ref 0.450–4.500)

## 2024-07-30 NOTE — Progress Notes (Unsigned)
 This encounter was created in error - please disregard.

## 2024-08-04 ENCOUNTER — Encounter: Admitting: Family Medicine

## 2024-08-06 ENCOUNTER — Ambulatory Visit

## 2024-08-07 ENCOUNTER — Ambulatory Visit (INDEPENDENT_AMBULATORY_CARE_PROVIDER_SITE_OTHER): Admitting: Family Medicine

## 2024-08-07 ENCOUNTER — Encounter: Payer: Self-pay | Admitting: Family Medicine

## 2024-08-07 VITALS — BP 122/57 | HR 95 | Ht 62.0 in | Wt 135.1 lb

## 2024-08-07 DIAGNOSIS — E039 Hypothyroidism, unspecified: Secondary | ICD-10-CM | POA: Diagnosis not present

## 2024-08-07 DIAGNOSIS — Z0000 Encounter for general adult medical examination without abnormal findings: Secondary | ICD-10-CM

## 2024-08-07 DIAGNOSIS — E785 Hyperlipidemia, unspecified: Secondary | ICD-10-CM

## 2024-08-07 DIAGNOSIS — Z0001 Encounter for general adult medical examination with abnormal findings: Secondary | ICD-10-CM | POA: Diagnosis not present

## 2024-08-07 DIAGNOSIS — H6122 Impacted cerumen, left ear: Secondary | ICD-10-CM

## 2024-08-07 DIAGNOSIS — E538 Deficiency of other specified B group vitamins: Secondary | ICD-10-CM | POA: Diagnosis not present

## 2024-08-07 DIAGNOSIS — E1169 Type 2 diabetes mellitus with other specified complication: Secondary | ICD-10-CM | POA: Diagnosis not present

## 2024-08-07 MED ORDER — CYANOCOBALAMIN 1000 MCG/ML IJ SOLN
1000.0000 ug | Freq: Once | INTRAMUSCULAR | Status: AC
  Administered 2024-08-07: 1000 ug via INTRAMUSCULAR

## 2024-08-07 NOTE — Assessment & Plan Note (Signed)
 Well-controlled with satisfactory A1c levels. Emphasis on maintaining low LDL cholesterol to reduce cardiovascular risk. - Continue current diabetes management plan - Will reassess cholesterol levels in six months

## 2024-08-07 NOTE — Assessment & Plan Note (Signed)
 TSH wnl Conitnue synthroid  at current dose

## 2024-08-07 NOTE — Progress Notes (Signed)
 Complete physical exam   Patient: Michaela Weber   DOB: 26-Apr-1946   78 y.o. Female  MRN: [id number] Visit Date: 08/07/2024  Today's healthcare provider: Jon Eva, MD   Chief Complaint  Patient presents with   Annual Exam    Last completed 08/02/23 Diet -  watching salt intake consuming 3 meals a day Exercise - three times a week for fifteen to twenty minutes Feeling - well Sleeping - well Concerns -  not sure about increasing crestor  and wants to know if it is good to take a magnesium with vitamin d     Subjective    Michaela Weber is a 78 y.o. female who presents today for a complete physical exam.   Discussed the use of AI scribe software for clinical note transcription with the patient, who gave verbal consent to proceed.  History of Present Illness   Michaela Weber is a 78 year old female with diabetes who presents for an annual physical exam and to discuss cholesterol management.  She is concerned about increasing LDL cholesterol levels and the side effects of statins, particularly muscle aches, which have resolved. She is currently taking Crestor  and is considering lifestyle changes for cholesterol management.  She receives regular B12 shots and recent labs show a good A1c level and normal thyroid  function. She continues on Synthroid  for thyroid  management.  She has a history of ear wax buildup and uses Debrox drops as needed. She has completed pneumonia vaccinations and had her last eye exam in April. No abdominal pain or leg running.       Last depression screening scores    07/15/2024    8:56 AM 01/31/2024    8:23 AM 08/02/2023    8:38 AM  PHQ 2/9 Scores  PHQ - 2 Score 0 0   PHQ- 9 Score 0     Exception Documentation   Patient refusal     Data saved with a previous flowsheet row definition   Last fall risk screening    07/11/2024    9:51 AM  Fall Risk   Falls in the past year? 0  Number falls in past yr: 0  Injury with Fall? 0   Risk for fall due to : No Fall Risks  Follow up Falls evaluation completed;Falls prevention discussed        Medications: Outpatient Medications Prior to Visit  Medication Sig   aspirin 81 MG EC tablet Take 81 mg by mouth daily.   Cyanocobalamin  (B-12 COMPLIANCE INJECTION) 1000 MCG/ML KIT Inject 1,000 mcg as directed every 30 (thirty) days. Once monthly injection   levothyroxine  (SYNTHROID ) 100 MCG tablet Take 1 tablet (100 mcg total) by mouth daily before breakfast.   lisinopril -hydrochlorothiazide  (ZESTORETIC ) 10-12.5 MG tablet TAKE 1 TABLET BY MOUTH DAILY   rosuvastatin  (CRESTOR ) 5 MG tablet Take 1 tablet (5 mg total) by mouth daily.   VITAMIN D , CHOLECALCIFEROL, PO Take 2,000 Units by mouth daily.   No facility-administered medications prior to visit.    Review of Systems    Objective    BP (!) 122/57 (BP Location: Left Arm, Patient Position: Sitting, Cuff Size: Normal)   Pulse 95   Ht 5' 2 (1.575 m)   Wt 135 lb 1.6 oz (61.3 kg)   SpO2 100%   BMI 24.71 kg/m    Physical Exam Vitals reviewed.  Constitutional:      General: She is not in acute distress.    Appearance: Normal appearance. She is well-developed.  She is not diaphoretic.  HENT:     Head: Normocephalic and atraumatic.     Right Ear: Tympanic membrane, ear canal and external ear normal.     Left Ear: Ear canal and external ear normal. There is impacted cerumen.     Nose: Nose normal.     Mouth/Throat:     Mouth: Mucous membranes are moist.     Pharynx: Oropharynx is clear. No oropharyngeal exudate.  Eyes:     General: No scleral icterus.    Conjunctiva/sclera: Conjunctivae normal.     Pupils: Pupils are equal, round, and reactive to light.  Neck:     Thyroid : No thyromegaly.  Cardiovascular:     Rate and Rhythm: Normal rate and regular rhythm.     Heart sounds: Normal heart sounds. No murmur heard. Pulmonary:     Effort: Pulmonary effort is normal. No respiratory distress.     Breath sounds:  Normal breath sounds. No wheezing or rales.  Abdominal:     General: There is no distension.     Palpations: Abdomen is soft.     Tenderness: There is no abdominal tenderness.  Musculoskeletal:        General: No deformity.     Cervical back: Neck supple.     Right lower leg: No edema.     Left lower leg: No edema.  Lymphadenopathy:     Cervical: No cervical adenopathy.  Skin:    General: Skin is warm and dry.     Findings: No rash.  Neurological:     Mental Status: She is alert and oriented to person, place, and time. Mental status is at baseline.     Gait: Gait normal.  Psychiatric:        Mood and Affect: Mood normal.        Behavior: Behavior normal.        Thought Content: Thought content normal.      No results found for any visits on 08/07/24.  Assessment & Plan    Routine Health Maintenance and Physical Exam  Exercise Activities and Dietary recommendations  Goals      DIET - EAT MORE FRUITS AND VEGETABLES     Patient Stated     Pt would like to adjust her diet, exercise to decrease A1C        Immunization History  Administered Date(s) Administered   Fluad Quad(high Dose 65+) 06/16/2019   INFLUENZA, HIGH DOSE SEASONAL PF 07/18/2016, 07/19/2018   Influenza-Unspecified 06/22/2014   Pneumococcal Conjugate-13 06/22/2014   Pneumococcal Polysaccharide-23 11/24/2011    Health Maintenance  Topic Date Due   DTaP/Tdap/Td (1 - Tdap) Never done   Zoster Vaccines- Shingrix (1 of 2) Never done   COVID-19 Vaccine (1 - 2025-26 season) Never done   Influenza Vaccine  12/23/2024 (Originally 04/25/2024)   OPHTHALMOLOGY EXAM  01/06/2025   HEMOGLOBIN A1C  01/26/2025   Diabetic kidney evaluation - Urine ACR  01/30/2025   FOOT EXAM  01/30/2025   Mammogram  06/13/2025   Medicare Annual Wellness (AWV)  07/15/2025   Diabetic kidney evaluation - eGFR measurement  07/29/2025   Pneumococcal Vaccine: 50+ Years  Completed   DEXA SCAN  Completed   Hepatitis C Screening   Completed   Meningococcal B Vaccine  Aged Out   Colonoscopy  Discontinued    Discussed health benefits of physical activity, and encouraged her to engage in regular exercise appropriate for her age and condition.  Problem List Items Addressed This Visit  Endocrine   Hyperlipidemia associated with type 2 diabetes mellitus (HCC)   LDL cholesterol slightly above target for patients with diabetes. Current LDL is 87 mg/dL, with a goal of under 70 mg/dL. Discussed potential benefits of lowering LDL to reduce cardiovascular risk. She prefers to maintain current Crestor  dosage and reassess in six months. Discussed potential side effects of statins, including muscle aches, and noted that newer statins like Crestor  have fewer side effects. - Continue current Crestor  dosage - Will reassess cholesterol levels in six months - Encouraged lifestyle modifications such as diet and exercise      Adult hypothyroidism   TSH wnl Conitnue synthroid  at current dose      T2DM (type 2 diabetes mellitus) (HCC)   Well-controlled with satisfactory A1c levels. Emphasis on maintaining low LDL cholesterol to reduce cardiovascular risk. - Continue current diabetes management plan - Will reassess cholesterol levels in six months        Other   B12 deficiency   Other Visit Diagnoses       Encounter for annual physical exam    -  Primary     Impacted cerumen of left ear               Adult Wellness Visit Routine adult wellness visit with satisfactory lab results, including A1c and thyroid  levels. B12 shots are ongoing. Pneumonia vaccinations are complete. No flu shot planned. Shingles and tetanus vaccinations discussed. Bone density checks discontinued. - Continue B12 shots - Maintain current Synthroid  dosage - Schedule annual eye exam in spring - Discontinued bone density checks  Vitamin B12 deficiency Managed with ongoing B12 injections. - Continue B12 injections  Cerumen impaction, left  ear Cerumen impaction in the left ear, previously treated with Debrox drops. Discussed potential impact on hearing and need for maintenance treatment. - Use Debrox drops weekly for maintenance        Return in about 6 months (around 02/04/2025) for chronic disease f/u.     Jon Eva, MD  [facility] [phone] (phone) [phone] (fax)  [facility]

## 2024-08-07 NOTE — Assessment & Plan Note (Signed)
 LDL cholesterol slightly above target for patients with diabetes. Current LDL is 87 mg/dL, with a goal of under 70 mg/dL. Discussed potential benefits of lowering LDL to reduce cardiovascular risk. She prefers to maintain current Crestor  dosage and reassess in six months. Discussed potential side effects of statins, including muscle aches, and noted that newer statins like Crestor  have fewer side effects. - Continue current Crestor  dosage - Will reassess cholesterol levels in six months - Encouraged lifestyle modifications such as diet and exercise

## 2024-09-03 ENCOUNTER — Ambulatory Visit (INDEPENDENT_AMBULATORY_CARE_PROVIDER_SITE_OTHER)

## 2024-09-03 DIAGNOSIS — E538 Deficiency of other specified B group vitamins: Secondary | ICD-10-CM | POA: Diagnosis not present

## 2024-09-03 MED ORDER — CYANOCOBALAMIN 1000 MCG/ML IJ SOLN
1000.0000 ug | Freq: Once | INTRAMUSCULAR | Status: AC
  Administered 2024-09-03: 1000 ug via INTRAMUSCULAR

## 2024-09-08 ENCOUNTER — Ambulatory Visit: Admitting: Family Medicine

## 2024-09-14 ENCOUNTER — Other Ambulatory Visit: Payer: Self-pay | Admitting: Family Medicine

## 2024-09-14 DIAGNOSIS — E039 Hypothyroidism, unspecified: Secondary | ICD-10-CM

## 2024-09-14 DIAGNOSIS — I10 Essential (primary) hypertension: Secondary | ICD-10-CM

## 2024-10-01 ENCOUNTER — Ambulatory Visit

## 2024-10-01 DIAGNOSIS — E538 Deficiency of other specified B group vitamins: Secondary | ICD-10-CM

## 2024-10-01 MED ORDER — CYANOCOBALAMIN 1000 MCG/ML IJ SOLN
1000.0000 ug | Freq: Once | INTRAMUSCULAR | Status: AC
  Administered 2024-10-01: 1000 ug via INTRAMUSCULAR

## 2024-10-29 ENCOUNTER — Ambulatory Visit

## 2024-11-05 ENCOUNTER — Ambulatory Visit

## 2025-02-05 ENCOUNTER — Ambulatory Visit: Admitting: Family Medicine

## 2025-07-21 ENCOUNTER — Ambulatory Visit
